# Patient Record
Sex: Male | Born: 1940 | Race: White | Hispanic: No | Marital: Married | Smoking: Never smoker
Health system: Southern US, Community
[De-identification: ages and names within clinical notes are randomized; demographics above are authoritative.]

## PROBLEM LIST (undated history)

## (undated) DIAGNOSIS — I10 Essential (primary) hypertension: Secondary | ICD-10-CM

## (undated) DIAGNOSIS — E119 Type 2 diabetes mellitus without complications: Secondary | ICD-10-CM

## (undated) HISTORY — PX: CATARACT EXTRACTION: SUR2

## (undated) HISTORY — PX: OTHER SURGICAL HISTORY: SHX169

---

## 1997-06-14 ENCOUNTER — Emergency Department (HOSPITAL_COMMUNITY): Admission: RE | Admit: 1997-06-14 | Discharge: 1997-06-14 | Payer: Self-pay | Admitting: Emergency Medicine

## 2000-10-15 ENCOUNTER — Emergency Department (HOSPITAL_COMMUNITY): Admission: EM | Admit: 2000-10-15 | Discharge: 2000-10-15 | Payer: Self-pay | Admitting: Emergency Medicine

## 2000-10-15 ENCOUNTER — Encounter: Payer: Self-pay | Admitting: Emergency Medicine

## 2002-03-08 ENCOUNTER — Ambulatory Visit (HOSPITAL_COMMUNITY): Admission: RE | Admit: 2002-03-08 | Discharge: 2002-03-08 | Payer: Self-pay | Admitting: *Deleted

## 2006-03-15 ENCOUNTER — Emergency Department (HOSPITAL_COMMUNITY): Admission: EM | Admit: 2006-03-15 | Discharge: 2006-03-15 | Payer: Self-pay | Admitting: Emergency Medicine

## 2006-03-30 ENCOUNTER — Ambulatory Visit (HOSPITAL_COMMUNITY): Admission: RE | Admit: 2006-03-30 | Discharge: 2006-03-30 | Payer: Self-pay | Admitting: Urology

## 2006-04-06 ENCOUNTER — Ambulatory Visit (HOSPITAL_COMMUNITY): Admission: AD | Admit: 2006-04-06 | Discharge: 2006-04-06 | Payer: Self-pay | Admitting: Urology

## 2006-08-10 ENCOUNTER — Encounter: Admission: RE | Admit: 2006-08-10 | Discharge: 2006-08-10 | Payer: Self-pay | Admitting: Internal Medicine

## 2010-06-14 NOTE — Op Note (Signed)
NAME:  Jose Higgins, Jose Higgins                 ACCOUNT NO.:  [id number]   MEDICAL RECORD NO.:  [id number]          PATIENT TYPE:  AMB   LOCATION:  DAY                          FACILITY:  [facility]   PHYSICIAN:  Excell Seltzer. Annabell Howells, M.D.    DATE OF BIRTH:  May 21, 1940   DATE OF PROCEDURE:  DATE OF DISCHARGE:                               OPERATIVE REPORT   Jose Higgins is a 70 year old white male with a right ureteral stone who  underwent lithotripsy a couple of weeks ago.  The stone did not  fragment.  He has continued to have pain and wants to go ahead with  ureteroscopy before he goes out of town.   PROCEDURE:  Patient is given Cipro.  He is taken to the operating room,  where a general anesthetic was induced.  He was placed in the lithotomy  position.  His perineum and genitalia were prepped with Betadine  solution.  He was draped in the usual sterile fashion.  Cystoscopy was  performed using a 22 [place] scope and the 12 and 70 degrees lenses.  Examination revealed a normal urethra.  The external sphincter was  intact.  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  Examination of the bladder revealed mild  trabeculation with no tumors, stones, or inflammation.  The ureteral  orifices were unremarkable.   An attempt was made to pass a Teflon-coated guidewire up the right  ureteral orifice.  It met the stone approximately 3 cm proximal to the  orifice and would not bypass the stone.  An attempt was then made to  pass the wire through a ureteroscope to stiffen it, but this was also  unsuccessful, so a sensor guidewire was then passed by the stone without  difficulty to the kidney.  The ureteroscope was removed, and a balloon  dilation catheter was placed over the wire.  The 4 cm 15 French balloon  was used.  It was inflated with saline to 16 atmospheres and then  removed.  The ureteroscope  was then reinserted alongside the wire.  The  stone was visualized, but it was  too large to remove intact.  A smaller  stone was noted proximal to the large stone, and that portion was  removed with a Nitinol basket.  The stone was then engaged with the  0.365 holmium laser fiber at 0.5 watts and 5 Hz, then fragmented readily  into manageable pieces, which were then removed with a nitinol basket.  After the stones were removed to the bladder, the cystoscope was then  reinserted over the guidewire.  A 6 French 26 cm double-J stent was  placed over the guidewire under fluoroscopic guidance to the kidney.  The wire was removed, leaving a good coil in the kidney and a good coil  in the bladder.  The stones were then evacuated from the bladder.  The  cystoscope was removed, and the stent string was left exiting the  urethra.  The bladder had been drained prior to removal of the scope.  The stent string was secured to the  patient's penis.  The patient was  taken down from the lithotomy position.  His anesthetic was reversed.  He was moved to the recovery room in stable condition.  There were no  complications.      Excell Seltzer. Annabell Howells, M.D.  Electronically Signed     JJW/MEDQ  D:  04/06/2006  T:  04/06/2006  Job:  [id number]   cc:   Soyla Murphy. Renne Crigler, M.D.  Fax: [phone]

## 2011-02-17 DIAGNOSIS — E7800 Pure hypercholesterolemia, unspecified: Secondary | ICD-10-CM | POA: Diagnosis not present

## 2011-02-17 DIAGNOSIS — E119 Type 2 diabetes mellitus without complications: Secondary | ICD-10-CM | POA: Diagnosis not present

## 2011-02-18 DIAGNOSIS — E119 Type 2 diabetes mellitus without complications: Secondary | ICD-10-CM | POA: Diagnosis not present

## 2011-02-18 DIAGNOSIS — I10 Essential (primary) hypertension: Secondary | ICD-10-CM | POA: Diagnosis not present

## 2011-02-18 DIAGNOSIS — E7800 Pure hypercholesterolemia, unspecified: Secondary | ICD-10-CM | POA: Diagnosis not present

## 2011-02-24 DIAGNOSIS — I10 Essential (primary) hypertension: Secondary | ICD-10-CM | POA: Diagnosis not present

## 2011-05-19 DIAGNOSIS — E7800 Pure hypercholesterolemia, unspecified: Secondary | ICD-10-CM | POA: Diagnosis not present

## 2011-05-19 DIAGNOSIS — E119 Type 2 diabetes mellitus without complications: Secondary | ICD-10-CM | POA: Diagnosis not present

## 2011-05-20 DIAGNOSIS — E119 Type 2 diabetes mellitus without complications: Secondary | ICD-10-CM | POA: Diagnosis not present

## 2011-05-20 DIAGNOSIS — E7800 Pure hypercholesterolemia, unspecified: Secondary | ICD-10-CM | POA: Diagnosis not present

## 2011-05-20 DIAGNOSIS — I10 Essential (primary) hypertension: Secondary | ICD-10-CM | POA: Diagnosis not present

## 2011-05-20 DIAGNOSIS — R7402 Elevation of levels of lactic acid dehydrogenase (LDH): Secondary | ICD-10-CM | POA: Diagnosis not present

## 2011-05-26 DIAGNOSIS — R7402 Elevation of levels of lactic acid dehydrogenase (LDH): Secondary | ICD-10-CM | POA: Diagnosis not present

## 2011-08-18 DIAGNOSIS — E119 Type 2 diabetes mellitus without complications: Secondary | ICD-10-CM | POA: Diagnosis not present

## 2011-08-18 DIAGNOSIS — E7800 Pure hypercholesterolemia, unspecified: Secondary | ICD-10-CM | POA: Diagnosis not present

## 2011-08-19 DIAGNOSIS — E875 Hyperkalemia: Secondary | ICD-10-CM | POA: Diagnosis not present

## 2011-08-19 DIAGNOSIS — I10 Essential (primary) hypertension: Secondary | ICD-10-CM | POA: Diagnosis not present

## 2011-08-19 DIAGNOSIS — N289 Disorder of kidney and ureter, unspecified: Secondary | ICD-10-CM | POA: Diagnosis not present

## 2011-08-19 DIAGNOSIS — E7800 Pure hypercholesterolemia, unspecified: Secondary | ICD-10-CM | POA: Diagnosis not present

## 2011-09-01 DIAGNOSIS — E1129 Type 2 diabetes mellitus with other diabetic kidney complication: Secondary | ICD-10-CM | POA: Diagnosis not present

## 2011-10-06 DIAGNOSIS — I10 Essential (primary) hypertension: Secondary | ICD-10-CM | POA: Diagnosis not present

## 2011-10-06 DIAGNOSIS — Z125 Encounter for screening for malignant neoplasm of prostate: Secondary | ICD-10-CM | POA: Diagnosis not present

## 2011-10-06 DIAGNOSIS — E1129 Type 2 diabetes mellitus with other diabetic kidney complication: Secondary | ICD-10-CM | POA: Diagnosis not present

## 2011-10-06 DIAGNOSIS — Z0000 Encounter for general adult medical examination without abnormal findings: Secondary | ICD-10-CM | POA: Diagnosis not present

## 2011-10-06 DIAGNOSIS — E7800 Pure hypercholesterolemia, unspecified: Secondary | ICD-10-CM | POA: Diagnosis not present

## 2011-10-09 DIAGNOSIS — I10 Essential (primary) hypertension: Secondary | ICD-10-CM | POA: Diagnosis not present

## 2011-10-09 DIAGNOSIS — Z0000 Encounter for general adult medical examination without abnormal findings: Secondary | ICD-10-CM | POA: Diagnosis not present

## 2011-10-09 DIAGNOSIS — Z23 Encounter for immunization: Secondary | ICD-10-CM | POA: Diagnosis not present

## 2011-10-09 DIAGNOSIS — R7402 Elevation of levels of lactic acid dehydrogenase (LDH): Secondary | ICD-10-CM | POA: Diagnosis not present

## 2011-10-09 DIAGNOSIS — E7800 Pure hypercholesterolemia, unspecified: Secondary | ICD-10-CM | POA: Diagnosis not present

## 2011-10-28 DIAGNOSIS — R972 Elevated prostate specific antigen [PSA]: Secondary | ICD-10-CM | POA: Diagnosis not present

## 2011-10-28 DIAGNOSIS — N138 Other obstructive and reflux uropathy: Secondary | ICD-10-CM | POA: Diagnosis not present

## 2011-10-28 DIAGNOSIS — N401 Enlarged prostate with lower urinary tract symptoms: Secondary | ICD-10-CM | POA: Diagnosis not present

## 2011-12-08 DIAGNOSIS — R972 Elevated prostate specific antigen [PSA]: Secondary | ICD-10-CM | POA: Diagnosis not present

## 2011-12-09 DIAGNOSIS — H6120 Impacted cerumen, unspecified ear: Secondary | ICD-10-CM | POA: Diagnosis not present

## 2011-12-18 DIAGNOSIS — E119 Type 2 diabetes mellitus without complications: Secondary | ICD-10-CM | POA: Diagnosis not present

## 2011-12-18 DIAGNOSIS — H2510 Age-related nuclear cataract, unspecified eye: Secondary | ICD-10-CM | POA: Diagnosis not present

## 2012-01-08 DIAGNOSIS — R972 Elevated prostate specific antigen [PSA]: Secondary | ICD-10-CM | POA: Diagnosis not present

## 2012-01-12 DIAGNOSIS — E7800 Pure hypercholesterolemia, unspecified: Secondary | ICD-10-CM | POA: Diagnosis not present

## 2012-01-12 DIAGNOSIS — E1129 Type 2 diabetes mellitus with other diabetic kidney complication: Secondary | ICD-10-CM | POA: Diagnosis not present

## 2012-01-13 DIAGNOSIS — E7800 Pure hypercholesterolemia, unspecified: Secondary | ICD-10-CM | POA: Diagnosis not present

## 2012-01-13 DIAGNOSIS — I10 Essential (primary) hypertension: Secondary | ICD-10-CM | POA: Diagnosis not present

## 2012-01-13 DIAGNOSIS — E1129 Type 2 diabetes mellitus with other diabetic kidney complication: Secondary | ICD-10-CM | POA: Diagnosis not present

## 2012-03-25 DIAGNOSIS — I781 Nevus, non-neoplastic: Secondary | ICD-10-CM | POA: Diagnosis not present

## 2012-03-25 DIAGNOSIS — L821 Other seborrheic keratosis: Secondary | ICD-10-CM | POA: Diagnosis not present

## 2012-03-25 DIAGNOSIS — Z85828 Personal history of other malignant neoplasm of skin: Secondary | ICD-10-CM | POA: Diagnosis not present

## 2012-03-25 DIAGNOSIS — D485 Neoplasm of uncertain behavior of skin: Secondary | ICD-10-CM | POA: Diagnosis not present

## 2012-03-25 DIAGNOSIS — D239 Other benign neoplasm of skin, unspecified: Secondary | ICD-10-CM | POA: Diagnosis not present

## 2012-04-13 DIAGNOSIS — R972 Elevated prostate specific antigen [PSA]: Secondary | ICD-10-CM | POA: Diagnosis not present

## 2012-04-19 DIAGNOSIS — N138 Other obstructive and reflux uropathy: Secondary | ICD-10-CM | POA: Diagnosis not present

## 2012-04-19 DIAGNOSIS — R972 Elevated prostate specific antigen [PSA]: Secondary | ICD-10-CM | POA: Diagnosis not present

## 2012-04-19 DIAGNOSIS — N401 Enlarged prostate with lower urinary tract symptoms: Secondary | ICD-10-CM | POA: Diagnosis not present

## 2012-04-19 DIAGNOSIS — N200 Calculus of kidney: Secondary | ICD-10-CM | POA: Diagnosis not present

## 2012-07-13 DIAGNOSIS — I10 Essential (primary) hypertension: Secondary | ICD-10-CM | POA: Diagnosis not present

## 2012-07-13 DIAGNOSIS — E7800 Pure hypercholesterolemia, unspecified: Secondary | ICD-10-CM | POA: Diagnosis not present

## 2012-07-13 DIAGNOSIS — E1129 Type 2 diabetes mellitus with other diabetic kidney complication: Secondary | ICD-10-CM | POA: Diagnosis not present

## 2012-10-07 DIAGNOSIS — Z23 Encounter for immunization: Secondary | ICD-10-CM | POA: Diagnosis not present

## 2012-10-07 DIAGNOSIS — E1129 Type 2 diabetes mellitus with other diabetic kidney complication: Secondary | ICD-10-CM | POA: Diagnosis not present

## 2012-10-07 DIAGNOSIS — Z0000 Encounter for general adult medical examination without abnormal findings: Secondary | ICD-10-CM | POA: Diagnosis not present

## 2012-10-07 DIAGNOSIS — N289 Disorder of kidney and ureter, unspecified: Secondary | ICD-10-CM | POA: Diagnosis not present

## 2012-10-07 DIAGNOSIS — I10 Essential (primary) hypertension: Secondary | ICD-10-CM | POA: Diagnosis not present

## 2012-10-07 DIAGNOSIS — Z125 Encounter for screening for malignant neoplasm of prostate: Secondary | ICD-10-CM | POA: Diagnosis not present

## 2012-10-07 DIAGNOSIS — E7800 Pure hypercholesterolemia, unspecified: Secondary | ICD-10-CM | POA: Diagnosis not present

## 2012-10-08 DIAGNOSIS — R197 Diarrhea, unspecified: Secondary | ICD-10-CM | POA: Diagnosis not present

## 2012-10-08 DIAGNOSIS — E7800 Pure hypercholesterolemia, unspecified: Secondary | ICD-10-CM | POA: Diagnosis not present

## 2012-10-08 DIAGNOSIS — I10 Essential (primary) hypertension: Secondary | ICD-10-CM | POA: Diagnosis not present

## 2012-10-08 DIAGNOSIS — E119 Type 2 diabetes mellitus without complications: Secondary | ICD-10-CM | POA: Diagnosis not present

## 2012-11-09 DIAGNOSIS — I10 Essential (primary) hypertension: Secondary | ICD-10-CM | POA: Diagnosis not present

## 2012-11-09 DIAGNOSIS — E7800 Pure hypercholesterolemia, unspecified: Secondary | ICD-10-CM | POA: Diagnosis not present

## 2012-11-09 DIAGNOSIS — E1129 Type 2 diabetes mellitus with other diabetic kidney complication: Secondary | ICD-10-CM | POA: Diagnosis not present

## 2012-11-10 DIAGNOSIS — E7800 Pure hypercholesterolemia, unspecified: Secondary | ICD-10-CM | POA: Diagnosis not present

## 2012-11-10 DIAGNOSIS — Z006 Encounter for examination for normal comparison and control in clinical research program: Secondary | ICD-10-CM | POA: Diagnosis not present

## 2012-11-10 DIAGNOSIS — N289 Disorder of kidney and ureter, unspecified: Secondary | ICD-10-CM | POA: Diagnosis not present

## 2012-11-10 DIAGNOSIS — I10 Essential (primary) hypertension: Secondary | ICD-10-CM | POA: Diagnosis not present

## 2012-11-16 DIAGNOSIS — M5430 Sciatica, unspecified side: Secondary | ICD-10-CM | POA: Diagnosis not present

## 2012-11-16 DIAGNOSIS — E1129 Type 2 diabetes mellitus with other diabetic kidney complication: Secondary | ICD-10-CM | POA: Diagnosis not present

## 2012-11-16 DIAGNOSIS — M25559 Pain in unspecified hip: Secondary | ICD-10-CM | POA: Diagnosis not present

## 2013-01-04 DIAGNOSIS — K644 Residual hemorrhoidal skin tags: Secondary | ICD-10-CM | POA: Diagnosis not present

## 2013-01-04 DIAGNOSIS — Z1211 Encounter for screening for malignant neoplasm of colon: Secondary | ICD-10-CM | POA: Diagnosis not present

## 2013-01-04 DIAGNOSIS — K5730 Diverticulosis of large intestine without perforation or abscess without bleeding: Secondary | ICD-10-CM | POA: Diagnosis not present

## 2013-01-04 DIAGNOSIS — D126 Benign neoplasm of colon, unspecified: Secondary | ICD-10-CM | POA: Diagnosis not present

## 2013-01-04 DIAGNOSIS — K620 Anal polyp: Secondary | ICD-10-CM | POA: Diagnosis not present

## 2013-01-13 DIAGNOSIS — E119 Type 2 diabetes mellitus without complications: Secondary | ICD-10-CM | POA: Diagnosis not present

## 2013-01-13 DIAGNOSIS — H2510 Age-related nuclear cataract, unspecified eye: Secondary | ICD-10-CM | POA: Diagnosis not present

## 2013-03-21 DIAGNOSIS — E7800 Pure hypercholesterolemia, unspecified: Secondary | ICD-10-CM | POA: Diagnosis not present

## 2013-03-21 DIAGNOSIS — E1129 Type 2 diabetes mellitus with other diabetic kidney complication: Secondary | ICD-10-CM | POA: Diagnosis not present

## 2013-03-22 DIAGNOSIS — I10 Essential (primary) hypertension: Secondary | ICD-10-CM | POA: Diagnosis not present

## 2013-03-22 DIAGNOSIS — E7800 Pure hypercholesterolemia, unspecified: Secondary | ICD-10-CM | POA: Diagnosis not present

## 2013-03-22 DIAGNOSIS — E1129 Type 2 diabetes mellitus with other diabetic kidney complication: Secondary | ICD-10-CM | POA: Diagnosis not present

## 2013-06-13 DIAGNOSIS — E1129 Type 2 diabetes mellitus with other diabetic kidney complication: Secondary | ICD-10-CM | POA: Diagnosis not present

## 2013-06-13 DIAGNOSIS — E7800 Pure hypercholesterolemia, unspecified: Secondary | ICD-10-CM | POA: Diagnosis not present

## 2013-06-14 DIAGNOSIS — E7800 Pure hypercholesterolemia, unspecified: Secondary | ICD-10-CM | POA: Diagnosis not present

## 2013-06-14 DIAGNOSIS — E1129 Type 2 diabetes mellitus with other diabetic kidney complication: Secondary | ICD-10-CM | POA: Diagnosis not present

## 2013-06-14 DIAGNOSIS — I10 Essential (primary) hypertension: Secondary | ICD-10-CM | POA: Diagnosis not present

## 2013-07-14 DIAGNOSIS — D239 Other benign neoplasm of skin, unspecified: Secondary | ICD-10-CM | POA: Diagnosis not present

## 2013-07-14 DIAGNOSIS — C44519 Basal cell carcinoma of skin of other part of trunk: Secondary | ICD-10-CM | POA: Diagnosis not present

## 2013-07-14 DIAGNOSIS — C44711 Basal cell carcinoma of skin of unspecified lower limb, including hip: Secondary | ICD-10-CM | POA: Diagnosis not present

## 2013-07-14 DIAGNOSIS — Z85828 Personal history of other malignant neoplasm of skin: Secondary | ICD-10-CM | POA: Diagnosis not present

## 2013-07-14 DIAGNOSIS — L821 Other seborrheic keratosis: Secondary | ICD-10-CM | POA: Diagnosis not present

## 2013-07-14 DIAGNOSIS — D485 Neoplasm of uncertain behavior of skin: Secondary | ICD-10-CM | POA: Diagnosis not present

## 2013-08-15 DIAGNOSIS — E1129 Type 2 diabetes mellitus with other diabetic kidney complication: Secondary | ICD-10-CM | POA: Diagnosis not present

## 2013-08-15 DIAGNOSIS — E7800 Pure hypercholesterolemia, unspecified: Secondary | ICD-10-CM | POA: Diagnosis not present

## 2013-08-15 DIAGNOSIS — C44519 Basal cell carcinoma of skin of other part of trunk: Secondary | ICD-10-CM | POA: Diagnosis not present

## 2013-08-15 DIAGNOSIS — C44711 Basal cell carcinoma of skin of unspecified lower limb, including hip: Secondary | ICD-10-CM | POA: Diagnosis not present

## 2013-08-16 DIAGNOSIS — E7800 Pure hypercholesterolemia, unspecified: Secondary | ICD-10-CM | POA: Diagnosis not present

## 2013-08-16 DIAGNOSIS — I10 Essential (primary) hypertension: Secondary | ICD-10-CM | POA: Diagnosis not present

## 2013-08-16 DIAGNOSIS — E1129 Type 2 diabetes mellitus with other diabetic kidney complication: Secondary | ICD-10-CM | POA: Diagnosis not present

## 2013-09-23 DIAGNOSIS — R972 Elevated prostate specific antigen [PSA]: Secondary | ICD-10-CM | POA: Diagnosis not present

## 2013-09-29 DIAGNOSIS — R972 Elevated prostate specific antigen [PSA]: Secondary | ICD-10-CM | POA: Diagnosis not present

## 2013-09-29 DIAGNOSIS — N139 Obstructive and reflux uropathy, unspecified: Secondary | ICD-10-CM | POA: Diagnosis not present

## 2013-09-29 DIAGNOSIS — N200 Calculus of kidney: Secondary | ICD-10-CM | POA: Diagnosis not present

## 2013-09-29 DIAGNOSIS — N401 Enlarged prostate with lower urinary tract symptoms: Secondary | ICD-10-CM | POA: Diagnosis not present

## 2013-09-29 DIAGNOSIS — N138 Other obstructive and reflux uropathy: Secondary | ICD-10-CM | POA: Diagnosis not present

## 2013-10-18 DIAGNOSIS — Z0000 Encounter for general adult medical examination without abnormal findings: Secondary | ICD-10-CM | POA: Diagnosis not present

## 2013-10-18 DIAGNOSIS — Z23 Encounter for immunization: Secondary | ICD-10-CM | POA: Diagnosis not present

## 2013-10-18 DIAGNOSIS — Z125 Encounter for screening for malignant neoplasm of prostate: Secondary | ICD-10-CM | POA: Diagnosis not present

## 2013-10-18 DIAGNOSIS — I10 Essential (primary) hypertension: Secondary | ICD-10-CM | POA: Diagnosis not present

## 2013-10-18 DIAGNOSIS — E1129 Type 2 diabetes mellitus with other diabetic kidney complication: Secondary | ICD-10-CM | POA: Diagnosis not present

## 2013-10-18 DIAGNOSIS — N289 Disorder of kidney and ureter, unspecified: Secondary | ICD-10-CM | POA: Diagnosis not present

## 2013-10-25 DIAGNOSIS — E1129 Type 2 diabetes mellitus with other diabetic kidney complication: Secondary | ICD-10-CM | POA: Diagnosis not present

## 2013-10-25 DIAGNOSIS — I10 Essential (primary) hypertension: Secondary | ICD-10-CM | POA: Diagnosis not present

## 2013-10-25 DIAGNOSIS — E7800 Pure hypercholesterolemia, unspecified: Secondary | ICD-10-CM | POA: Diagnosis not present

## 2013-10-25 DIAGNOSIS — N289 Disorder of kidney and ureter, unspecified: Secondary | ICD-10-CM | POA: Diagnosis not present

## 2013-11-11 DIAGNOSIS — Z23 Encounter for immunization: Secondary | ICD-10-CM | POA: Diagnosis not present

## 2013-11-14 DIAGNOSIS — E1121 Type 2 diabetes mellitus with diabetic nephropathy: Secondary | ICD-10-CM | POA: Diagnosis not present

## 2013-11-15 DIAGNOSIS — E785 Hyperlipidemia, unspecified: Secondary | ICD-10-CM | POA: Diagnosis not present

## 2013-11-15 DIAGNOSIS — E119 Type 2 diabetes mellitus without complications: Secondary | ICD-10-CM | POA: Diagnosis not present

## 2013-11-15 DIAGNOSIS — I10 Essential (primary) hypertension: Secondary | ICD-10-CM | POA: Diagnosis not present

## 2014-02-13 DIAGNOSIS — E785 Hyperlipidemia, unspecified: Secondary | ICD-10-CM | POA: Diagnosis not present

## 2014-02-13 DIAGNOSIS — E118 Type 2 diabetes mellitus with unspecified complications: Secondary | ICD-10-CM | POA: Diagnosis not present

## 2014-02-13 DIAGNOSIS — I10 Essential (primary) hypertension: Secondary | ICD-10-CM | POA: Diagnosis not present

## 2014-02-14 DIAGNOSIS — I10 Essential (primary) hypertension: Secondary | ICD-10-CM | POA: Diagnosis not present

## 2014-02-14 DIAGNOSIS — E119 Type 2 diabetes mellitus without complications: Secondary | ICD-10-CM | POA: Diagnosis not present

## 2014-02-14 DIAGNOSIS — E785 Hyperlipidemia, unspecified: Secondary | ICD-10-CM | POA: Diagnosis not present

## 2014-04-06 DIAGNOSIS — E119 Type 2 diabetes mellitus without complications: Secondary | ICD-10-CM | POA: Diagnosis not present

## 2014-04-06 DIAGNOSIS — H2513 Age-related nuclear cataract, bilateral: Secondary | ICD-10-CM | POA: Diagnosis not present

## 2014-05-15 DIAGNOSIS — E119 Type 2 diabetes mellitus without complications: Secondary | ICD-10-CM | POA: Diagnosis not present

## 2014-05-15 DIAGNOSIS — I10 Essential (primary) hypertension: Secondary | ICD-10-CM | POA: Diagnosis not present

## 2014-05-15 DIAGNOSIS — E785 Hyperlipidemia, unspecified: Secondary | ICD-10-CM | POA: Diagnosis not present

## 2014-05-16 DIAGNOSIS — E785 Hyperlipidemia, unspecified: Secondary | ICD-10-CM | POA: Diagnosis not present

## 2014-05-16 DIAGNOSIS — I10 Essential (primary) hypertension: Secondary | ICD-10-CM | POA: Diagnosis not present

## 2014-05-16 DIAGNOSIS — E119 Type 2 diabetes mellitus without complications: Secondary | ICD-10-CM | POA: Diagnosis not present

## 2014-06-09 DIAGNOSIS — D485 Neoplasm of uncertain behavior of skin: Secondary | ICD-10-CM | POA: Diagnosis not present

## 2014-06-09 DIAGNOSIS — C44212 Basal cell carcinoma of skin of right ear and external auricular canal: Secondary | ICD-10-CM | POA: Diagnosis not present

## 2014-06-09 DIAGNOSIS — L821 Other seborrheic keratosis: Secondary | ICD-10-CM | POA: Diagnosis not present

## 2014-07-18 DIAGNOSIS — C44212 Basal cell carcinoma of skin of right ear and external auricular canal: Secondary | ICD-10-CM | POA: Diagnosis not present

## 2014-08-14 DIAGNOSIS — E119 Type 2 diabetes mellitus without complications: Secondary | ICD-10-CM | POA: Diagnosis not present

## 2014-08-14 DIAGNOSIS — E785 Hyperlipidemia, unspecified: Secondary | ICD-10-CM | POA: Diagnosis not present

## 2014-08-14 DIAGNOSIS — I10 Essential (primary) hypertension: Secondary | ICD-10-CM | POA: Diagnosis not present

## 2014-08-15 DIAGNOSIS — I10 Essential (primary) hypertension: Secondary | ICD-10-CM | POA: Diagnosis not present

## 2014-08-15 DIAGNOSIS — E119 Type 2 diabetes mellitus without complications: Secondary | ICD-10-CM | POA: Diagnosis not present

## 2014-08-15 DIAGNOSIS — E785 Hyperlipidemia, unspecified: Secondary | ICD-10-CM | POA: Diagnosis not present

## 2014-08-28 DIAGNOSIS — L821 Other seborrheic keratosis: Secondary | ICD-10-CM | POA: Diagnosis not present

## 2014-08-28 DIAGNOSIS — D225 Melanocytic nevi of trunk: Secondary | ICD-10-CM | POA: Diagnosis not present

## 2014-08-28 DIAGNOSIS — Z85828 Personal history of other malignant neoplasm of skin: Secondary | ICD-10-CM | POA: Diagnosis not present

## 2014-10-03 DIAGNOSIS — J40 Bronchitis, not specified as acute or chronic: Secondary | ICD-10-CM | POA: Diagnosis not present

## 2014-10-23 DIAGNOSIS — N138 Other obstructive and reflux uropathy: Secondary | ICD-10-CM | POA: Diagnosis not present

## 2014-10-23 DIAGNOSIS — N401 Enlarged prostate with lower urinary tract symptoms: Secondary | ICD-10-CM | POA: Diagnosis not present

## 2014-10-23 DIAGNOSIS — N200 Calculus of kidney: Secondary | ICD-10-CM | POA: Diagnosis not present

## 2014-10-23 DIAGNOSIS — R3911 Hesitancy of micturition: Secondary | ICD-10-CM | POA: Diagnosis not present

## 2014-11-13 DIAGNOSIS — I10 Essential (primary) hypertension: Secondary | ICD-10-CM | POA: Diagnosis not present

## 2014-11-13 DIAGNOSIS — E119 Type 2 diabetes mellitus without complications: Secondary | ICD-10-CM | POA: Diagnosis not present

## 2014-11-13 DIAGNOSIS — Z0000 Encounter for general adult medical examination without abnormal findings: Secondary | ICD-10-CM | POA: Diagnosis not present

## 2014-11-13 DIAGNOSIS — Z23 Encounter for immunization: Secondary | ICD-10-CM | POA: Diagnosis not present

## 2014-11-13 DIAGNOSIS — Z125 Encounter for screening for malignant neoplasm of prostate: Secondary | ICD-10-CM | POA: Diagnosis not present

## 2014-11-13 DIAGNOSIS — E785 Hyperlipidemia, unspecified: Secondary | ICD-10-CM | POA: Diagnosis not present

## 2014-11-13 DIAGNOSIS — R972 Elevated prostate specific antigen [PSA]: Secondary | ICD-10-CM | POA: Diagnosis not present

## 2014-11-14 DIAGNOSIS — E785 Hyperlipidemia, unspecified: Secondary | ICD-10-CM | POA: Diagnosis not present

## 2014-11-14 DIAGNOSIS — I10 Essential (primary) hypertension: Secondary | ICD-10-CM | POA: Diagnosis not present

## 2014-11-14 DIAGNOSIS — E119 Type 2 diabetes mellitus without complications: Secondary | ICD-10-CM | POA: Diagnosis not present

## 2014-11-20 DIAGNOSIS — E1129 Type 2 diabetes mellitus with other diabetic kidney complication: Secondary | ICD-10-CM | POA: Diagnosis not present

## 2014-11-20 DIAGNOSIS — I10 Essential (primary) hypertension: Secondary | ICD-10-CM | POA: Diagnosis not present

## 2014-11-20 DIAGNOSIS — E7800 Pure hypercholesterolemia, unspecified: Secondary | ICD-10-CM | POA: Diagnosis not present

## 2014-11-20 DIAGNOSIS — Z6828 Body mass index (BMI) 28.0-28.9, adult: Secondary | ICD-10-CM | POA: Diagnosis not present

## 2015-01-10 DIAGNOSIS — E119 Type 2 diabetes mellitus without complications: Secondary | ICD-10-CM | POA: Diagnosis not present

## 2015-01-10 DIAGNOSIS — I10 Essential (primary) hypertension: Secondary | ICD-10-CM | POA: Diagnosis not present

## 2015-03-12 DIAGNOSIS — E119 Type 2 diabetes mellitus without complications: Secondary | ICD-10-CM | POA: Diagnosis not present

## 2015-03-12 DIAGNOSIS — E1129 Type 2 diabetes mellitus with other diabetic kidney complication: Secondary | ICD-10-CM | POA: Diagnosis not present

## 2015-04-02 DIAGNOSIS — H2513 Age-related nuclear cataract, bilateral: Secondary | ICD-10-CM | POA: Diagnosis not present

## 2015-04-02 DIAGNOSIS — E119 Type 2 diabetes mellitus without complications: Secondary | ICD-10-CM | POA: Diagnosis not present

## 2015-04-02 DIAGNOSIS — H524 Presbyopia: Secondary | ICD-10-CM | POA: Diagnosis not present

## 2015-05-14 DIAGNOSIS — E1129 Type 2 diabetes mellitus with other diabetic kidney complication: Secondary | ICD-10-CM | POA: Diagnosis not present

## 2015-05-14 DIAGNOSIS — E119 Type 2 diabetes mellitus without complications: Secondary | ICD-10-CM | POA: Diagnosis not present

## 2015-05-14 DIAGNOSIS — E785 Hyperlipidemia, unspecified: Secondary | ICD-10-CM | POA: Diagnosis not present

## 2015-05-14 DIAGNOSIS — I10 Essential (primary) hypertension: Secondary | ICD-10-CM | POA: Diagnosis not present

## 2015-05-14 DIAGNOSIS — E1121 Type 2 diabetes mellitus with diabetic nephropathy: Secondary | ICD-10-CM | POA: Diagnosis not present

## 2015-05-15 DIAGNOSIS — E785 Hyperlipidemia, unspecified: Secondary | ICD-10-CM | POA: Diagnosis not present

## 2015-05-15 DIAGNOSIS — E119 Type 2 diabetes mellitus without complications: Secondary | ICD-10-CM | POA: Diagnosis not present

## 2015-05-15 DIAGNOSIS — I10 Essential (primary) hypertension: Secondary | ICD-10-CM | POA: Diagnosis not present

## 2015-06-13 DIAGNOSIS — R972 Elevated prostate specific antigen [PSA]: Secondary | ICD-10-CM | POA: Diagnosis not present

## 2015-08-20 DIAGNOSIS — E7800 Pure hypercholesterolemia, unspecified: Secondary | ICD-10-CM | POA: Diagnosis not present

## 2015-08-21 DIAGNOSIS — E119 Type 2 diabetes mellitus without complications: Secondary | ICD-10-CM | POA: Diagnosis not present

## 2015-08-21 DIAGNOSIS — I10 Essential (primary) hypertension: Secondary | ICD-10-CM | POA: Diagnosis not present

## 2015-08-21 DIAGNOSIS — E7800 Pure hypercholesterolemia, unspecified: Secondary | ICD-10-CM | POA: Diagnosis not present

## 2015-09-10 DIAGNOSIS — D225 Melanocytic nevi of trunk: Secondary | ICD-10-CM | POA: Diagnosis not present

## 2015-09-10 DIAGNOSIS — Z85828 Personal history of other malignant neoplasm of skin: Secondary | ICD-10-CM | POA: Diagnosis not present

## 2015-09-10 DIAGNOSIS — D485 Neoplasm of uncertain behavior of skin: Secondary | ICD-10-CM | POA: Diagnosis not present

## 2015-09-10 DIAGNOSIS — L821 Other seborrheic keratosis: Secondary | ICD-10-CM | POA: Diagnosis not present

## 2015-09-10 DIAGNOSIS — L570 Actinic keratosis: Secondary | ICD-10-CM | POA: Diagnosis not present

## 2015-11-07 DIAGNOSIS — Z23 Encounter for immunization: Secondary | ICD-10-CM | POA: Diagnosis not present

## 2015-11-29 DIAGNOSIS — E7800 Pure hypercholesterolemia, unspecified: Secondary | ICD-10-CM | POA: Diagnosis not present

## 2015-11-29 DIAGNOSIS — Z125 Encounter for screening for malignant neoplasm of prostate: Secondary | ICD-10-CM | POA: Diagnosis not present

## 2015-11-29 DIAGNOSIS — E119 Type 2 diabetes mellitus without complications: Secondary | ICD-10-CM | POA: Diagnosis not present

## 2015-11-29 DIAGNOSIS — I10 Essential (primary) hypertension: Secondary | ICD-10-CM | POA: Diagnosis not present

## 2015-11-29 DIAGNOSIS — Z0000 Encounter for general adult medical examination without abnormal findings: Secondary | ICD-10-CM | POA: Diagnosis not present

## 2015-12-03 DIAGNOSIS — J309 Allergic rhinitis, unspecified: Secondary | ICD-10-CM | POA: Diagnosis not present

## 2015-12-03 DIAGNOSIS — Z23 Encounter for immunization: Secondary | ICD-10-CM | POA: Diagnosis not present

## 2015-12-03 DIAGNOSIS — I10 Essential (primary) hypertension: Secondary | ICD-10-CM | POA: Diagnosis not present

## 2015-12-03 DIAGNOSIS — E119 Type 2 diabetes mellitus without complications: Secondary | ICD-10-CM | POA: Diagnosis not present

## 2015-12-03 DIAGNOSIS — Z87442 Personal history of urinary calculi: Secondary | ICD-10-CM | POA: Diagnosis not present

## 2015-12-03 DIAGNOSIS — R3911 Hesitancy of micturition: Secondary | ICD-10-CM | POA: Diagnosis not present

## 2015-12-03 DIAGNOSIS — N5201 Erectile dysfunction due to arterial insufficiency: Secondary | ICD-10-CM | POA: Diagnosis not present

## 2015-12-03 DIAGNOSIS — R972 Elevated prostate specific antigen [PSA]: Secondary | ICD-10-CM | POA: Diagnosis not present

## 2015-12-03 DIAGNOSIS — N401 Enlarged prostate with lower urinary tract symptoms: Secondary | ICD-10-CM | POA: Diagnosis not present

## 2015-12-14 DIAGNOSIS — H6122 Impacted cerumen, left ear: Secondary | ICD-10-CM | POA: Diagnosis not present

## 2015-12-14 DIAGNOSIS — I10 Essential (primary) hypertension: Secondary | ICD-10-CM | POA: Diagnosis not present

## 2015-12-25 DIAGNOSIS — Z23 Encounter for immunization: Secondary | ICD-10-CM | POA: Diagnosis not present

## 2015-12-25 DIAGNOSIS — D485 Neoplasm of uncertain behavior of skin: Secondary | ICD-10-CM | POA: Diagnosis not present

## 2015-12-26 DIAGNOSIS — L820 Inflamed seborrheic keratosis: Secondary | ICD-10-CM | POA: Diagnosis not present

## 2016-02-18 DIAGNOSIS — E119 Type 2 diabetes mellitus without complications: Secondary | ICD-10-CM | POA: Diagnosis not present

## 2016-02-18 DIAGNOSIS — E7800 Pure hypercholesterolemia, unspecified: Secondary | ICD-10-CM | POA: Diagnosis not present

## 2016-02-18 DIAGNOSIS — I10 Essential (primary) hypertension: Secondary | ICD-10-CM | POA: Diagnosis not present

## 2016-02-19 DIAGNOSIS — E119 Type 2 diabetes mellitus without complications: Secondary | ICD-10-CM | POA: Diagnosis not present

## 2016-02-19 DIAGNOSIS — I10 Essential (primary) hypertension: Secondary | ICD-10-CM | POA: Diagnosis not present

## 2016-05-12 DIAGNOSIS — E119 Type 2 diabetes mellitus without complications: Secondary | ICD-10-CM | POA: Diagnosis not present

## 2016-05-12 DIAGNOSIS — H524 Presbyopia: Secondary | ICD-10-CM | POA: Diagnosis not present

## 2016-05-12 DIAGNOSIS — H2513 Age-related nuclear cataract, bilateral: Secondary | ICD-10-CM | POA: Diagnosis not present

## 2016-08-11 DIAGNOSIS — E119 Type 2 diabetes mellitus without complications: Secondary | ICD-10-CM | POA: Diagnosis not present

## 2016-08-12 DIAGNOSIS — I10 Essential (primary) hypertension: Secondary | ICD-10-CM | POA: Diagnosis not present

## 2016-08-12 DIAGNOSIS — E119 Type 2 diabetes mellitus without complications: Secondary | ICD-10-CM | POA: Diagnosis not present

## 2016-08-27 DIAGNOSIS — D170 Benign lipomatous neoplasm of skin and subcutaneous tissue of head, face and neck: Secondary | ICD-10-CM | POA: Diagnosis not present

## 2016-09-16 DIAGNOSIS — Z85828 Personal history of other malignant neoplasm of skin: Secondary | ICD-10-CM | POA: Diagnosis not present

## 2016-09-16 DIAGNOSIS — D170 Benign lipomatous neoplasm of skin and subcutaneous tissue of head, face and neck: Secondary | ICD-10-CM | POA: Diagnosis not present

## 2016-09-16 DIAGNOSIS — L821 Other seborrheic keratosis: Secondary | ICD-10-CM | POA: Diagnosis not present

## 2016-09-19 ENCOUNTER — Ambulatory Visit: Payer: Self-pay | Admitting: Surgery

## 2016-09-19 DIAGNOSIS — D170 Benign lipomatous neoplasm of skin and subcutaneous tissue of head, face and neck: Secondary | ICD-10-CM | POA: Diagnosis not present

## 2016-09-19 NOTE — H&P (Signed)
History of Present Illness Jose Higgins. Jose Lindor MD; 09/19/2016 1:10 PM) The patient is a 76 year old male who presents with a complaint of Mass. Referred by Dr. Deland Higgins for mass posterior neck  This is a 76 year old male in good health who presents with at least 20 years of a slowly enlarging mass on his posterior neck. Recently he woke from sleeping and had severe pain in this area. Frequently, he does have some moderate discomfort in this area. However this one episode was more severe. This has resolved but the mass persisted. The patient will like to have this area removed to prevent a recurrence of the severe attack pain.   Past Surgical History (Jose Higgins, Jose Higgins; 09/19/2016 10:05 AM) Colon Polyp Removal - Open Oral Surgery Tonsillectomy  Diagnostic Studies History (Jose Higgins, Jose Higgins; 09/19/2016 10:05 AM) Colonoscopy 1-5 years ago  Allergies (Jose Higgins, Jose Higgins; 09/19/2016 10:06 AM) No Known Drug Allergies 09/19/2016 Allergies Reconciled  Medication History (Jose Higgins, Jose Higgins; 09/19/2016 10:08 AM) Jose Higgins (50-1000MG  Tablet, Oral) Active. Losartan Potassium (50MG  Tablet, Oral) Active. Metoprolol Tartrate (50MG  Tablet, Oral) Active. Tamsulosin HCl (0.4MG  Capsule, Oral) Active. Jose Higgins (10MG  Tablet, Oral) Active. Atorvastatin Calcium (20MG  Tablet, Oral) Active. ZyrTEC Allergy (10MG  Tablet, Oral) Active. Medications Reconciled  Social History (Jose Higgins, Jose Higgins; 09/19/2016 10:05 AM) Alcohol use Occasional alcohol use. Caffeine use Coffee. No drug use Tobacco use Never smoker.  Family History (Jose Higgins, Jose Higgins; 09/19/2016 10:05 AM) Arthritis Father. Heart Disease Father. Heart disease in male family member before age 66 Respiratory Condition Mother.  Other Problems (Jose Higgins, [place]; 09/19/2016 10:05 AM) Diabetes Mellitus Enlarged Prostate Kidney Stone Melanoma     Review of Systems (Jose Higgins; 09/19/2016  10:05 AM) General Not Present- Appetite Loss, Chills, Fatigue, Fever, Night Sweats, Weight Gain and Weight Loss. Skin Not Present- Change in Wart/Mole, Dryness, Hives, Jaundice, New Lesions, Non-Healing Wounds, Rash and Ulcer. HEENT Present- Seasonal Allergies. Not Present- Earache, Hearing Loss, Hoarseness, Nose Bleed, Oral Ulcers, Ringing in the Ears, Sinus Pain, Sore Throat, Visual Disturbances, Wears glasses/contact lenses and Yellow Eyes. Respiratory Not Present- Bloody sputum, Chronic Cough, Difficulty Breathing, Snoring and Wheezing. Breast Not Present- Breast Mass, Breast Pain, Nipple Discharge and Skin Changes. Cardiovascular Present- Leg Cramps. Not Present- Chest Pain, Difficulty Breathing Lying Down, Palpitations, Rapid Heart Rate, Shortness of Breath and Swelling of Extremities. Gastrointestinal Not Present- Abdominal Pain, Bloating, Bloody Stool, Change in Bowel Habits, Chronic diarrhea, Constipation, Difficulty Swallowing, Excessive gas, Gets full quickly at meals, Hemorrhoids, Indigestion, Nausea, Rectal Pain and Vomiting. Male Genitourinary Present- Change in Urinary Stream. Not Present- Blood in Urine, Frequency, Impotence, Nocturia, Painful Urination, Urgency and Urine Leakage. Musculoskeletal Present- Joint Stiffness. Not Present- Back Pain, Joint Pain, Muscle Pain, Muscle Weakness and Swelling of Extremities. Neurological Not Present- Decreased Memory, Fainting, Headaches, Numbness, Seizures, Tingling, Tremor, Trouble walking and Weakness. Psychiatric Not Present- Anxiety, Bipolar, Change in Sleep Pattern, Depression, Fearful and Frequent crying. Endocrine Not Present- Cold Intolerance, Excessive Hunger, Hair Changes, Heat Intolerance, Hot flashes and New Diabetes. Hematology Not Present- Blood Thinners, Easy Bruising, Excessive bleeding, Gland problems, HIV and Persistent Infections.  Vitals (Jose Higgins; 09/19/2016 10:06 AM) 09/19/2016 10:05 AM Weight: 210 lb Height:  70in Body Surface Area: 2.13 m Body Mass Index: 30.13 kg/m  Temp.: 97.34F  Pulse: 84 (Regular)  P.OX: 94% (Room air) BP: 132/86 (Sitting, Left Arm, Standard)      Physical Exam Jose Key K. Kalle Bernath MD; 09/19/2016 1:11 PM)  The physical exam findings are as follows: Note:WDWN in NAD Eyes: Pupils equal, round; sclera anicteric HENT: Oral mucosa moist; good dentition Neck: Posterior neck - 3 cm protruding subcutaneous mass; smooth, firm, no sign of infection. no thyromegaly Lungs: CTA bilaterally; normal respiratory effort CV: Regular rate and rhythm; no murmurs; extremities well-perfused with no edema Abd: +bowel sounds, soft, non-tender, no palpable organomegaly; no palpable hernias Skin: Warm, dry; no sign of jaundice Psychiatric - alert and oriented x 4; calm mood and affect    Assessment & Plan Jose Key K. 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 The surgical procedure has been discussed with the patient. Potential risks, benefits, alternative treatments, and expected outcomes have been explained. All of the patient's questions at this time have been answered. The likelihood of reaching the patient's treatment goal is good. The patient understand the proposed surgical procedure and wishes to proceed.  Jose Higgins. Jose Dover, MD, [facility] Surgery  General/ Trauma Surgery  09/19/2016 1:11 PM

## 2016-10-28 DIAGNOSIS — D170 Benign lipomatous neoplasm of skin and subcutaneous tissue of head, face and neck: Secondary | ICD-10-CM | POA: Diagnosis not present

## 2016-11-06 DIAGNOSIS — Z23 Encounter for immunization: Secondary | ICD-10-CM | POA: Diagnosis not present

## 2016-12-08 DIAGNOSIS — E785 Hyperlipidemia, unspecified: Secondary | ICD-10-CM | POA: Diagnosis not present

## 2016-12-08 DIAGNOSIS — Z0000 Encounter for general adult medical examination without abnormal findings: Secondary | ICD-10-CM | POA: Diagnosis not present

## 2016-12-08 DIAGNOSIS — E119 Type 2 diabetes mellitus without complications: Secondary | ICD-10-CM | POA: Diagnosis not present

## 2016-12-08 DIAGNOSIS — Z7901 Long term (current) use of anticoagulants: Secondary | ICD-10-CM | POA: Diagnosis not present

## 2016-12-08 DIAGNOSIS — I10 Essential (primary) hypertension: Secondary | ICD-10-CM | POA: Diagnosis not present

## 2016-12-08 DIAGNOSIS — Z125 Encounter for screening for malignant neoplasm of prostate: Secondary | ICD-10-CM | POA: Diagnosis not present

## 2016-12-11 DIAGNOSIS — H269 Unspecified cataract: Secondary | ICD-10-CM | POA: Diagnosis not present

## 2016-12-11 DIAGNOSIS — I10 Essential (primary) hypertension: Secondary | ICD-10-CM | POA: Diagnosis not present

## 2016-12-11 DIAGNOSIS — Z7901 Long term (current) use of anticoagulants: Secondary | ICD-10-CM | POA: Diagnosis not present

## 2016-12-11 DIAGNOSIS — E785 Hyperlipidemia, unspecified: Secondary | ICD-10-CM | POA: Diagnosis not present

## 2016-12-11 DIAGNOSIS — M7050 Other bursitis of knee, unspecified knee: Secondary | ICD-10-CM | POA: Diagnosis not present

## 2016-12-11 DIAGNOSIS — N401 Enlarged prostate with lower urinary tract symptoms: Secondary | ICD-10-CM | POA: Diagnosis not present

## 2016-12-11 DIAGNOSIS — J309 Allergic rhinitis, unspecified: Secondary | ICD-10-CM | POA: Diagnosis not present

## 2016-12-11 DIAGNOSIS — R972 Elevated prostate specific antigen [PSA]: Secondary | ICD-10-CM | POA: Diagnosis not present

## 2016-12-11 DIAGNOSIS — E119 Type 2 diabetes mellitus without complications: Secondary | ICD-10-CM | POA: Diagnosis not present

## 2016-12-11 DIAGNOSIS — N529 Male erectile dysfunction, unspecified: Secondary | ICD-10-CM | POA: Diagnosis not present

## 2017-01-12 DIAGNOSIS — N5201 Erectile dysfunction due to arterial insufficiency: Secondary | ICD-10-CM | POA: Diagnosis not present

## 2017-01-12 DIAGNOSIS — N401 Enlarged prostate with lower urinary tract symptoms: Secondary | ICD-10-CM | POA: Diagnosis not present

## 2017-01-12 DIAGNOSIS — R3911 Hesitancy of micturition: Secondary | ICD-10-CM | POA: Diagnosis not present

## 2017-01-12 DIAGNOSIS — R972 Elevated prostate specific antigen [PSA]: Secondary | ICD-10-CM | POA: Diagnosis not present

## 2017-03-09 DIAGNOSIS — E785 Hyperlipidemia, unspecified: Secondary | ICD-10-CM | POA: Diagnosis not present

## 2017-03-09 DIAGNOSIS — E119 Type 2 diabetes mellitus without complications: Secondary | ICD-10-CM | POA: Diagnosis not present

## 2017-03-10 DIAGNOSIS — I10 Essential (primary) hypertension: Secondary | ICD-10-CM | POA: Diagnosis not present

## 2017-03-10 DIAGNOSIS — E119 Type 2 diabetes mellitus without complications: Secondary | ICD-10-CM | POA: Diagnosis not present

## 2017-03-10 DIAGNOSIS — E785 Hyperlipidemia, unspecified: Secondary | ICD-10-CM | POA: Diagnosis not present

## 2017-05-21 DIAGNOSIS — H524 Presbyopia: Secondary | ICD-10-CM | POA: Diagnosis not present

## 2017-05-21 DIAGNOSIS — H2513 Age-related nuclear cataract, bilateral: Secondary | ICD-10-CM | POA: Diagnosis not present

## 2017-05-21 DIAGNOSIS — E119 Type 2 diabetes mellitus without complications: Secondary | ICD-10-CM | POA: Diagnosis not present

## 2017-06-01 DIAGNOSIS — L821 Other seborrheic keratosis: Secondary | ICD-10-CM | POA: Diagnosis not present

## 2017-06-01 DIAGNOSIS — D3610 Benign neoplasm of peripheral nerves and autonomic nervous system, unspecified: Secondary | ICD-10-CM | POA: Diagnosis not present

## 2017-06-01 DIAGNOSIS — D235 Other benign neoplasm of skin of trunk: Secondary | ICD-10-CM | POA: Diagnosis not present

## 2017-06-01 DIAGNOSIS — D1801 Hemangioma of skin and subcutaneous tissue: Secondary | ICD-10-CM | POA: Diagnosis not present

## 2017-09-07 DIAGNOSIS — E119 Type 2 diabetes mellitus without complications: Secondary | ICD-10-CM | POA: Diagnosis not present

## 2017-09-08 DIAGNOSIS — I10 Essential (primary) hypertension: Secondary | ICD-10-CM | POA: Diagnosis not present

## 2017-09-08 DIAGNOSIS — E119 Type 2 diabetes mellitus without complications: Secondary | ICD-10-CM | POA: Diagnosis not present

## 2017-09-08 DIAGNOSIS — E785 Hyperlipidemia, unspecified: Secondary | ICD-10-CM | POA: Diagnosis not present

## 2017-10-30 DIAGNOSIS — D485 Neoplasm of uncertain behavior of skin: Secondary | ICD-10-CM | POA: Diagnosis not present

## 2017-10-30 DIAGNOSIS — L821 Other seborrheic keratosis: Secondary | ICD-10-CM | POA: Diagnosis not present

## 2017-10-30 DIAGNOSIS — Z85828 Personal history of other malignant neoplasm of skin: Secondary | ICD-10-CM | POA: Diagnosis not present

## 2017-10-30 DIAGNOSIS — L570 Actinic keratosis: Secondary | ICD-10-CM | POA: Diagnosis not present

## 2017-10-30 DIAGNOSIS — C44712 Basal cell carcinoma of skin of right lower limb, including hip: Secondary | ICD-10-CM | POA: Diagnosis not present

## 2017-10-30 DIAGNOSIS — Z23 Encounter for immunization: Secondary | ICD-10-CM | POA: Diagnosis not present

## 2017-11-03 DIAGNOSIS — Z23 Encounter for immunization: Secondary | ICD-10-CM | POA: Diagnosis not present

## 2017-12-15 ENCOUNTER — Other Ambulatory Visit: Payer: Self-pay

## 2017-12-15 DIAGNOSIS — C44712 Basal cell carcinoma of skin of right lower limb, including hip: Secondary | ICD-10-CM | POA: Diagnosis not present

## 2017-12-16 DIAGNOSIS — E785 Hyperlipidemia, unspecified: Secondary | ICD-10-CM | POA: Diagnosis not present

## 2017-12-16 DIAGNOSIS — Z125 Encounter for screening for malignant neoplasm of prostate: Secondary | ICD-10-CM | POA: Diagnosis not present

## 2017-12-16 DIAGNOSIS — E119 Type 2 diabetes mellitus without complications: Secondary | ICD-10-CM | POA: Diagnosis not present

## 2017-12-16 DIAGNOSIS — I10 Essential (primary) hypertension: Secondary | ICD-10-CM | POA: Diagnosis not present

## 2017-12-21 DIAGNOSIS — R066 Hiccough: Secondary | ICD-10-CM | POA: Diagnosis not present

## 2017-12-21 DIAGNOSIS — E1140 Type 2 diabetes mellitus with diabetic neuropathy, unspecified: Secondary | ICD-10-CM | POA: Diagnosis not present

## 2017-12-21 DIAGNOSIS — I10 Essential (primary) hypertension: Secondary | ICD-10-CM | POA: Diagnosis not present

## 2017-12-21 DIAGNOSIS — E785 Hyperlipidemia, unspecified: Secondary | ICD-10-CM | POA: Diagnosis not present

## 2017-12-21 DIAGNOSIS — N401 Enlarged prostate with lower urinary tract symptoms: Secondary | ICD-10-CM | POA: Diagnosis not present

## 2017-12-21 DIAGNOSIS — Z7982 Long term (current) use of aspirin: Secondary | ICD-10-CM | POA: Diagnosis not present

## 2017-12-21 DIAGNOSIS — Z23 Encounter for immunization: Secondary | ICD-10-CM | POA: Diagnosis not present

## 2017-12-21 DIAGNOSIS — Z0001 Encounter for general adult medical examination with abnormal findings: Secondary | ICD-10-CM | POA: Diagnosis not present

## 2018-01-05 DIAGNOSIS — Z4802 Encounter for removal of sutures: Secondary | ICD-10-CM | POA: Diagnosis not present

## 2018-01-05 DIAGNOSIS — T8149XA Infection following a procedure, other surgical site, initial encounter: Secondary | ICD-10-CM | POA: Diagnosis not present

## 2018-01-05 DIAGNOSIS — Z5189 Encounter for other specified aftercare: Secondary | ICD-10-CM | POA: Diagnosis not present

## 2018-04-05 DIAGNOSIS — E785 Hyperlipidemia, unspecified: Secondary | ICD-10-CM | POA: Diagnosis not present

## 2018-04-05 DIAGNOSIS — E119 Type 2 diabetes mellitus without complications: Secondary | ICD-10-CM | POA: Diagnosis not present

## 2018-04-06 DIAGNOSIS — Z111 Encounter for screening for respiratory tuberculosis: Secondary | ICD-10-CM | POA: Diagnosis not present

## 2018-04-06 DIAGNOSIS — I10 Essential (primary) hypertension: Secondary | ICD-10-CM | POA: Diagnosis not present

## 2018-04-06 DIAGNOSIS — E1140 Type 2 diabetes mellitus with diabetic neuropathy, unspecified: Secondary | ICD-10-CM | POA: Diagnosis not present

## 2018-04-06 DIAGNOSIS — R972 Elevated prostate specific antigen [PSA]: Secondary | ICD-10-CM | POA: Diagnosis not present

## 2018-04-06 DIAGNOSIS — E785 Hyperlipidemia, unspecified: Secondary | ICD-10-CM | POA: Diagnosis not present

## 2018-04-06 DIAGNOSIS — N401 Enlarged prostate with lower urinary tract symptoms: Secondary | ICD-10-CM | POA: Diagnosis not present

## 2018-08-16 DIAGNOSIS — E785 Hyperlipidemia, unspecified: Secondary | ICD-10-CM | POA: Diagnosis not present

## 2018-08-16 DIAGNOSIS — N401 Enlarged prostate with lower urinary tract symptoms: Secondary | ICD-10-CM | POA: Diagnosis not present

## 2018-08-16 DIAGNOSIS — E1140 Type 2 diabetes mellitus with diabetic neuropathy, unspecified: Secondary | ICD-10-CM | POA: Diagnosis not present

## 2018-08-16 DIAGNOSIS — I10 Essential (primary) hypertension: Secondary | ICD-10-CM | POA: Diagnosis not present

## 2018-08-17 DIAGNOSIS — R972 Elevated prostate specific antigen [PSA]: Secondary | ICD-10-CM | POA: Diagnosis not present

## 2018-08-17 DIAGNOSIS — E1140 Type 2 diabetes mellitus with diabetic neuropathy, unspecified: Secondary | ICD-10-CM | POA: Diagnosis not present

## 2018-08-17 DIAGNOSIS — N401 Enlarged prostate with lower urinary tract symptoms: Secondary | ICD-10-CM | POA: Diagnosis not present

## 2018-08-17 DIAGNOSIS — E785 Hyperlipidemia, unspecified: Secondary | ICD-10-CM | POA: Diagnosis not present

## 2018-08-17 DIAGNOSIS — I10 Essential (primary) hypertension: Secondary | ICD-10-CM | POA: Diagnosis not present

## 2018-10-27 DIAGNOSIS — N401 Enlarged prostate with lower urinary tract symptoms: Secondary | ICD-10-CM | POA: Diagnosis not present

## 2018-10-27 DIAGNOSIS — N5201 Erectile dysfunction due to arterial insufficiency: Secondary | ICD-10-CM | POA: Diagnosis not present

## 2018-10-27 DIAGNOSIS — R3915 Urgency of urination: Secondary | ICD-10-CM | POA: Diagnosis not present

## 2018-10-27 DIAGNOSIS — R972 Elevated prostate specific antigen [PSA]: Secondary | ICD-10-CM | POA: Diagnosis not present

## 2018-11-10 DIAGNOSIS — Z23 Encounter for immunization: Secondary | ICD-10-CM | POA: Diagnosis not present

## 2018-11-12 DIAGNOSIS — Z23 Encounter for immunization: Secondary | ICD-10-CM | POA: Diagnosis not present

## 2018-11-12 DIAGNOSIS — Z85828 Personal history of other malignant neoplasm of skin: Secondary | ICD-10-CM | POA: Diagnosis not present

## 2018-11-12 DIAGNOSIS — L821 Other seborrheic keratosis: Secondary | ICD-10-CM | POA: Diagnosis not present

## 2018-12-22 ENCOUNTER — Other Ambulatory Visit: Payer: Self-pay

## 2018-12-27 DIAGNOSIS — E118 Type 2 diabetes mellitus with unspecified complications: Secondary | ICD-10-CM | POA: Diagnosis not present

## 2018-12-27 DIAGNOSIS — E119 Type 2 diabetes mellitus without complications: Secondary | ICD-10-CM | POA: Diagnosis not present

## 2018-12-27 DIAGNOSIS — I10 Essential (primary) hypertension: Secondary | ICD-10-CM | POA: Diagnosis not present

## 2019-02-08 DIAGNOSIS — L603 Nail dystrophy: Secondary | ICD-10-CM | POA: Diagnosis not present

## 2019-02-08 DIAGNOSIS — B356 Tinea cruris: Secondary | ICD-10-CM | POA: Diagnosis not present

## 2019-02-08 DIAGNOSIS — Z23 Encounter for immunization: Secondary | ICD-10-CM | POA: Diagnosis not present

## 2019-02-21 ENCOUNTER — Emergency Department (HOSPITAL_BASED_OUTPATIENT_CLINIC_OR_DEPARTMENT_OTHER): Payer: PPO

## 2019-02-21 ENCOUNTER — Encounter (HOSPITAL_COMMUNITY): Payer: Self-pay | Admitting: Emergency Medicine

## 2019-02-21 ENCOUNTER — Other Ambulatory Visit: Payer: Self-pay | Admitting: Adult Health

## 2019-02-21 ENCOUNTER — Inpatient Hospital Stay (HOSPITAL_COMMUNITY)
Admission: EM | Admit: 2019-02-21 | Discharge: 2019-02-25 | DRG: 177 | Disposition: A | Discharge status: Home Health | Payer: PPO | Attending: Internal Medicine | Admitting: Internal Medicine

## 2019-02-21 ENCOUNTER — Emergency Department (HOSPITAL_COMMUNITY): Payer: PPO

## 2019-02-21 ENCOUNTER — Encounter (HOSPITAL_BASED_OUTPATIENT_CLINIC_OR_DEPARTMENT_OTHER): Payer: Self-pay | Admitting: *Deleted

## 2019-02-21 ENCOUNTER — Other Ambulatory Visit: Payer: Self-pay

## 2019-02-21 ENCOUNTER — Emergency Department (HOSPITAL_BASED_OUTPATIENT_CLINIC_OR_DEPARTMENT_OTHER)
Admission: EM | Admit: 2019-02-21 | Discharge: 2019-02-21 | Disposition: A | Discharge status: Home / Self Care | Payer: PPO | Source: Home / Self Care | Attending: Emergency Medicine | Admitting: Emergency Medicine

## 2019-02-21 DIAGNOSIS — J9601 Acute respiratory failure with hypoxia: Secondary | ICD-10-CM | POA: Diagnosis present

## 2019-02-21 DIAGNOSIS — R112 Nausea with vomiting, unspecified: Secondary | ICD-10-CM | POA: Diagnosis not present

## 2019-02-21 DIAGNOSIS — R509 Fever, unspecified: Secondary | ICD-10-CM | POA: Diagnosis not present

## 2019-02-21 DIAGNOSIS — I959 Hypotension, unspecified: Secondary | ICD-10-CM | POA: Diagnosis not present

## 2019-02-21 DIAGNOSIS — J1282 Pneumonia due to coronavirus disease 2019: Secondary | ICD-10-CM | POA: Diagnosis present

## 2019-02-21 DIAGNOSIS — D638 Anemia in other chronic diseases classified elsewhere: Secondary | ICD-10-CM | POA: Diagnosis present

## 2019-02-21 DIAGNOSIS — E860 Dehydration: Secondary | ICD-10-CM | POA: Diagnosis not present

## 2019-02-21 DIAGNOSIS — Z7984 Long term (current) use of oral hypoglycemic drugs: Secondary | ICD-10-CM

## 2019-02-21 DIAGNOSIS — R066 Hiccough: Secondary | ICD-10-CM | POA: Diagnosis present

## 2019-02-21 DIAGNOSIS — T380X5A Adverse effect of glucocorticoids and synthetic analogues, initial encounter: Secondary | ICD-10-CM | POA: Diagnosis present

## 2019-02-21 DIAGNOSIS — J988 Other specified respiratory disorders: Secondary | ICD-10-CM | POA: Diagnosis present

## 2019-02-21 DIAGNOSIS — R5381 Other malaise: Secondary | ICD-10-CM | POA: Diagnosis not present

## 2019-02-21 DIAGNOSIS — E871 Hypo-osmolality and hyponatremia: Secondary | ICD-10-CM

## 2019-02-21 DIAGNOSIS — R69 Illness, unspecified: Secondary | ICD-10-CM | POA: Diagnosis not present

## 2019-02-21 DIAGNOSIS — E1165 Type 2 diabetes mellitus with hyperglycemia: Secondary | ICD-10-CM | POA: Diagnosis not present

## 2019-02-21 DIAGNOSIS — Z7982 Long term (current) use of aspirin: Secondary | ICD-10-CM | POA: Diagnosis not present

## 2019-02-21 DIAGNOSIS — I10 Essential (primary) hypertension: Secondary | ICD-10-CM | POA: Diagnosis not present

## 2019-02-21 DIAGNOSIS — R270 Ataxia, unspecified: Secondary | ICD-10-CM | POA: Diagnosis not present

## 2019-02-21 DIAGNOSIS — R0902 Hypoxemia: Secondary | ICD-10-CM | POA: Diagnosis not present

## 2019-02-21 DIAGNOSIS — R0602 Shortness of breath: Secondary | ICD-10-CM | POA: Diagnosis not present

## 2019-02-21 DIAGNOSIS — R531 Weakness: Secondary | ICD-10-CM | POA: Diagnosis not present

## 2019-02-21 DIAGNOSIS — E861 Hypovolemia: Secondary | ICD-10-CM | POA: Diagnosis present

## 2019-02-21 DIAGNOSIS — U071 COVID-19: Principal | ICD-10-CM | POA: Diagnosis present

## 2019-02-21 DIAGNOSIS — E785 Hyperlipidemia, unspecified: Secondary | ICD-10-CM | POA: Diagnosis not present

## 2019-02-21 DIAGNOSIS — E119 Type 2 diabetes mellitus without complications: Secondary | ICD-10-CM | POA: Insufficient documentation

## 2019-02-21 DIAGNOSIS — E876 Hypokalemia: Secondary | ICD-10-CM | POA: Diagnosis not present

## 2019-02-21 DIAGNOSIS — Z79899 Other long term (current) drug therapy: Secondary | ICD-10-CM

## 2019-02-21 DIAGNOSIS — Z209 Contact with and (suspected) exposure to unspecified communicable disease: Secondary | ICD-10-CM | POA: Diagnosis not present

## 2019-02-21 DIAGNOSIS — R110 Nausea: Secondary | ICD-10-CM | POA: Diagnosis not present

## 2019-02-21 HISTORY — DX: Type 2 diabetes mellitus without complications: E11.9

## 2019-02-21 HISTORY — DX: Essential (primary) hypertension: I10

## 2019-02-21 LAB — CBC WITH DIFFERENTIAL/PLATELET
Abs Immature Granulocytes: 0.02 10*3/uL (ref 0.00–0.07)
Basophils Absolute: 0 10*3/uL (ref 0.0–0.1)
Basophils Relative: 0 %
Eosinophils Absolute: 0 10*3/uL (ref 0.0–0.5)
Eosinophils Relative: 0 %
HCT: 35.7 % — ABNORMAL LOW (ref 39.0–52.0)
Hemoglobin: 12.4 g/dL — ABNORMAL LOW (ref 13.0–17.0)
Immature Granulocytes: 0 %
Lymphocytes Relative: 12 %
Lymphs Abs: 0.7 10*3/uL (ref 0.7–4.0)
MCH: 31.6 pg (ref 26.0–34.0)
MCHC: 34.7 g/dL (ref 30.0–36.0)
MCV: 91.1 fL (ref 80.0–100.0)
Monocytes Absolute: 0.4 10*3/uL (ref 0.1–1.0)
Monocytes Relative: 6 %
Neutro Abs: 4.8 10*3/uL (ref 1.7–7.7)
Neutrophils Relative %: 82 %
Platelets: 130 10*3/uL — ABNORMAL LOW (ref 150–400)
RBC: 3.92 MIL/uL — ABNORMAL LOW (ref 4.22–5.81)
RDW: 11.8 % (ref 11.5–15.5)
WBC: 5.9 10*3/uL (ref 4.0–10.5)
nRBC: 0 % (ref 0.0–0.2)

## 2019-02-21 LAB — BASIC METABOLIC PANEL
Anion gap: 10 (ref 5–15)
BUN: 15 mg/dL (ref 8–23)
CO2: 22 mmol/L (ref 22–32)
Calcium: 7.9 mg/dL — ABNORMAL LOW (ref 8.9–10.3)
Chloride: 96 mmol/L — ABNORMAL LOW (ref 98–111)
Creatinine, Ser: 1.06 mg/dL (ref 0.61–1.24)
GFR calc Af Amer: >60 mL/min (ref >60)
GFR calc non Af Amer: >60 mL/min (ref >60)
Glucose, Bld: 205 mg/dL — ABNORMAL HIGH (ref 70–99)
Potassium: 3.6 mmol/L (ref 3.5–5.1)
Sodium: 128 mmol/L — ABNORMAL LOW (ref 135–145)

## 2019-02-21 LAB — SARS CORONAVIRUS 2 AG (30 MIN TAT): SARS Coronavirus 2 Ag: POSITIVE — AB

## 2019-02-21 LAB — FERRITIN: Ferritin: 196 ng/mL (ref 24–336)

## 2019-02-21 LAB — COMPREHENSIVE METABOLIC PANEL
ALT: 21 U/L (ref 0–44)
AST: 25 U/L (ref 15–41)
Albumin: 3.2 g/dL — ABNORMAL LOW (ref 3.5–5.0)
Alkaline Phosphatase: 38 U/L (ref 38–126)
Anion gap: 9 (ref 5–15)
BUN: 12 mg/dL (ref 8–23)
CO2: 23 mmol/L (ref 22–32)
Calcium: 8.3 mg/dL — ABNORMAL LOW (ref 8.9–10.3)
Chloride: 95 mmol/L — ABNORMAL LOW (ref 98–111)
Creatinine, Ser: 0.95 mg/dL (ref 0.61–1.24)
GFR calc Af Amer: >60 mL/min (ref >60)
GFR calc non Af Amer: >60 mL/min (ref >60)
Glucose, Bld: 212 mg/dL — ABNORMAL HIGH (ref 70–99)
Potassium: 3.9 mmol/L (ref 3.5–5.1)
Sodium: 127 mmol/L — ABNORMAL LOW (ref 135–145)
Total Bilirubin: 2 mg/dL — ABNORMAL HIGH (ref 0.3–1.2)
Total Protein: 6.5 g/dL (ref 6.5–8.1)

## 2019-02-21 LAB — LACTIC ACID, PLASMA: Lactic Acid, Venous: 1.3 mmol/L (ref 0.5–1.9)

## 2019-02-21 LAB — C-REACTIVE PROTEIN: CRP: 10.1 mg/dL — ABNORMAL HIGH (ref <1.0)

## 2019-02-21 LAB — TRIGLYCERIDES: Triglycerides: 65 mg/dL (ref <150)

## 2019-02-21 LAB — FIBRINOGEN: Fibrinogen: 490 mg/dL — ABNORMAL HIGH (ref 210–475)

## 2019-02-21 LAB — LACTATE DEHYDROGENASE: LDH: 170 U/L (ref 98–192)

## 2019-02-21 LAB — PROCALCITONIN: Procalcitonin: <0.1 ng/mL

## 2019-02-21 LAB — D-DIMER, QUANTITATIVE: D-Dimer, Quant: 1.1 ug/mL-FEU — ABNORMAL HIGH (ref 0.00–0.50)

## 2019-02-21 MED ORDER — ACETAMINOPHEN 325 MG PO TABS
650.0000 mg | ORAL_TABLET | Freq: Once | ORAL | Status: AC
  Administered 2019-02-21: 20:00:00 650 mg via ORAL
  Filled 2019-02-21: qty 2

## 2019-02-21 MED ORDER — ENOXAPARIN SODIUM 40 MG/0.4ML ~~LOC~~ SOLN
40.0000 mg | SUBCUTANEOUS | Status: DC
  Administered 2019-02-22 – 2019-02-24 (×2): 40 mg via SUBCUTANEOUS
  Filled 2019-02-21 (×3): qty 0.4

## 2019-02-21 MED ORDER — ACETAMINOPHEN 325 MG PO TABS
650.0000 mg | ORAL_TABLET | Freq: Once | ORAL | Status: AC
  Administered 2019-02-21: 650 mg via ORAL
  Filled 2019-02-21: qty 2

## 2019-02-21 MED ORDER — SODIUM CHLORIDE 0.9 % IV BOLUS
1000.0000 mL | Freq: Once | INTRAVENOUS | Status: AC
  Administered 2019-02-21: 1000 mL via INTRAVENOUS

## 2019-02-21 MED ORDER — CHLORPROMAZINE HCL 25 MG PO TABS
25.0000 mg | ORAL_TABLET | Freq: Once | ORAL | Status: AC
  Administered 2019-02-21: 25 mg via ORAL
  Filled 2019-02-21: qty 1

## 2019-02-21 MED ORDER — ONDANSETRON 4 MG PO TBDP: 4.0000 mg | ORAL_TABLET | Freq: Three times a day (TID) | ORAL | 0 refills | Status: DC | PRN

## 2019-02-21 MED ORDER — SODIUM CHLORIDE 0.9 % IV SOLN: INTRAVENOUS | Status: DC

## 2019-02-21 MED ORDER — SODIUM CHLORIDE 0.9 % IV SOLN
200.0000 mg | Freq: Once | INTRAVENOUS | Status: AC
  Administered 2019-02-21: 22:00:00 200 mg via INTRAVENOUS
  Filled 2019-02-21: qty 200

## 2019-02-21 MED ORDER — ONDANSETRON HCL 4 MG/2ML IJ SOLN
4.0000 mg | Freq: Once | INTRAMUSCULAR | Status: AC
  Administered 2019-02-21: 4 mg via INTRAVENOUS
  Filled 2019-02-21: qty 2

## 2019-02-21 MED ORDER — CHLORPROMAZINE HCL 25 MG PO TABS: 25.0000 mg | ORAL_TABLET | Freq: Three times a day (TID) | ORAL | 0 refills | Status: DC | PRN

## 2019-02-21 MED ORDER — SODIUM CHLORIDE 0.9 % IV SOLN
100.0000 mg | Freq: Every day | INTRAVENOUS | Status: AC
  Administered 2019-02-22 – 2019-02-25 (×4): 100 mg via INTRAVENOUS
  Filled 2019-02-21 (×4): qty 20

## 2019-02-21 NOTE — ED Notes (Signed)
[facility] called. 

## 2019-02-21 NOTE — ED Provider Notes (Signed)
[facility] EMERGENCY DEPARTMENT Provider Note   CSN: [id number] Arrival date & time: 02/21/19  [id number]     History Chief Complaint  Patient presents with  . covid symptoms    Jose Higgins is a 79 y.o. male.  HPI     This is a 79 year old male with a history of diabetes and hypertension who presents with nausea, hiccups, myalgias.  Patient reports he has had symptoms since Wednesday.  He reports no known Covid exposure last Friday and Saturday.  He states that he tested negative this past Thursday.  Symptoms started on Wednesday.  He reports full body myalgias.  He reports temperatures to 101.  Additionally he reports ongoing nausea.  No vomiting or abdominal pain.  He has had hiccups since that time as well.  He reports an ongoing cough but nothing new.  Denies any shortness of breath, chest pain.  He reports taking an over-the-counter medication with no relief. Past Medical History:  Diagnosis Date  . Diabetes mellitus without complication ([place])   . Hypertension     There are no problems to display for this patient.   History reviewed. No pertinent surgical history.     No family history on file.  Social History   Tobacco Use  . Smoking status: Never Smoker  . Smokeless tobacco: Never Used  Substance Use Topics  . Alcohol use: Never  . Drug use: Never    Home Medications Prior to Admission medications   Medication Sig Start Date End Date Taking? Authorizing Provider  alfuzosin (UROXATRAL) 10 MG 24 hr tablet Take 10 mg by mouth daily with breakfast.   Yes [provider]  atorvastatin (LIPITOR) 20 MG tablet Take 20 mg by mouth daily.   Yes [provider]  cetirizine (ZYRTEC) 10 MG tablet Take 10 mg by mouth daily.   Yes [provider]  metoprolol tartrate (LOPRESSOR) 50 MG tablet Take 50 mg by mouth 2 (two) times daily.   Yes [provider]  sitaGLIPtin-metformin (JANUMET) 50-1000 MG tablet Take 1 tablet by mouth 2  (two) times daily with a meal.   Yes [provider]  chlorproMAZINE (THORAZINE) 25 MG tablet Take 1 tablet (25 mg total) by mouth 3 (three) times daily as needed for hiccoughs. 02/21/19   Kimberla Driskill, Barbette Hair, MD  ondansetron (ZOFRAN ODT) 4 MG disintegrating tablet Take 1 tablet (4 mg total) by mouth every 8 (eight) hours as needed for nausea or vomiting. 02/21/19   Keely Drennan, Barbette Hair, MD    Allergies    Patient has no allergy information on record.  Review of Systems   Review of Systems  Constitutional: Positive for chills and fever.  Respiratory: Positive for cough. Negative for shortness of breath.   Cardiovascular: Negative for chest pain.  Gastrointestinal: Positive for nausea. Negative for abdominal pain, diarrhea and vomiting.  Genitourinary: Negative for dysuria.  Musculoskeletal: Positive for myalgias.  Neurological: Positive for light-headedness.  All other systems reviewed and are negative.   Physical Exam Updated Vital Signs BP (!) 151/87 (BP Location: Right Arm)   Pulse 71   Temp (!) 101.2 F (38.4 C) (Oral)   Resp (!) 25   Ht 1.778 m (5\' 10" )   Wt 91.6 kg   SpO2 92%   BMI 28.98 kg/m   Physical Exam Vitals and nursing note reviewed.  Constitutional:      General: He is not in acute distress.    Appearance: He is well-developed.  HENT:  Head: Normocephalic and atraumatic.     Mouth/Throat:     Mouth: Mucous membranes are dry.  Eyes:     Pupils: Pupils are equal, round, and reactive to light.  Cardiovascular:     Rate and Rhythm: Normal rate and regular rhythm.  Pulmonary:     Effort: Pulmonary effort is normal. No respiratory distress.     Breath sounds: Normal breath sounds. No wheezing.     Comments: Slight tachypnea noted, speaking in full sentences, hiccups Abdominal:     General: Bowel sounds are normal.     Palpations: Abdomen is soft.     Tenderness: There is no abdominal tenderness. There is no guarding or rebound.  Musculoskeletal:       Cervical back: Neck supple.     Right lower leg: No edema.     Left lower leg: No edema.  Skin:    General: Skin is warm and dry.  Neurological:     Mental Status: He is alert and oriented to person, place, and time.  Psychiatric:        Mood and Affect: Mood normal.     ED Results / Procedures / Treatments   Labs (all labs ordered are listed, but only abnormal results are displayed) Labs Reviewed  SARS CORONAVIRUS 2 AG (30 MIN TAT) - Abnormal; Notable for the following components:      Result Value   SARS Coronavirus 2 Ag POSITIVE (*)    All other components within normal limits  CBC WITH DIFFERENTIAL/PLATELET - Abnormal; Notable for the following components:   RBC 3.92 (*)    Hemoglobin 12.4 (*)    HCT 35.7 (*)    Platelets 130 (*)    All other components within normal limits  COMPREHENSIVE METABOLIC PANEL - Abnormal; Notable for the following components:   Sodium 127 (*)    Chloride 95 (*)    Glucose, Bld 212 (*)    Calcium 8.3 (*)    Albumin 3.2 (*)    Total Bilirubin 2.0 (*)    All other components within normal limits  D-DIMER, QUANTITATIVE (NOT AT [facility]) - Abnormal; Notable for the following components:   D-Dimer, Quant 1.10 (*)    All other components within normal limits  FIBRINOGEN - Abnormal; Notable for the following components:   Fibrinogen 490 (*)    All other components within normal limits  C-REACTIVE PROTEIN - Abnormal; Notable for the following components:   CRP 10.1 (*)    All other components within normal limits  CULTURE, BLOOD (ROUTINE X 2)  CULTURE, BLOOD (ROUTINE X 2)  LACTIC ACID, PLASMA  LACTATE DEHYDROGENASE  FERRITIN  TRIGLYCERIDES  PROCALCITONIN    EKG EKG Interpretation  Date/Time:  Monday February 21 2019 00:39:56 EST Ventricular Rate:  70 PR Interval:    QRS Duration: 83 QT Interval:  396 QTC Calculation: 428 R Axis:   14 Text Interpretation: Sinus rhythm Atrial premature complex Confirmed by Thayer Jew [phone]) on  02/21/2019 1:59:12 AM   Radiology DG Chest Port 1 View  Result Date: 02/21/2019 CLINICAL DATA:  Fever and recent COVID exposure EXAM: PORTABLE CHEST 1 VIEW COMPARISON:  None. FINDINGS: The heart size and mediastinal contours are within normal limits. Both lungs are clear. The visualized skeletal structures are unremarkable. IMPRESSION: No active disease. Electronically Signed   By: Ulyses Jarred M.D.   On: 02/21/2019 01:53    Procedures Procedures (including critical care time)  Medications Ordered in ED Medications  ondansetron (ZOFRAN) injection 4  mg (4 mg Intravenous Given 02/21/19 0111)  acetaminophen (TYLENOL) tablet 650 mg (650 mg Oral Given 02/21/19 0111)  chlorproMAZINE (THORAZINE) tablet 25 mg (25 mg Oral Given 02/21/19 0111)  sodium chloride 0.9 % bolus 1,000 mL (0 mLs Intravenous Stopped 02/21/19 0306)    ED Course  I have reviewed the triage vital signs and the nursing notes.  Pertinent labs & imaging results that were available during my care of the patient were reviewed by me and considered in my medical decision making (see chart for details).    MDM Rules/Calculators/A&P                      Patient presents today with symptoms consistent with COVID-19.  Initially febrile and mildly tachypneic.  O2 sats 92%.  Patient is in no respiratory distress.  His main complaint is nausea and hiccups as well as myalgias.  Lab work obtained.  Chest x-ray shows no evidence of pneumonia.  EKG without arrhythmia or ischemic changes.  Lab work-up is notable for hyponatremia with a sodium of 127.  Patient was given fluids.  He has mild elevations in CRP, fibrinogen, and D-dimer.  Lactate is normal.  Doubt sepsis.  Patient has remained clinically stable while in the emergency department.  O2 sats 92 to 94% on room air.  He is not short of breath.  I discussed with patient his positive COVID-19 test results.  He was offered admission given borderline O2 sats and evidence of hyponatremia  dehydration.  Patient declined.  He was advised that he may get worse and he will need to be reevaluated if he has any new or worsening symptoms particularly shortness of breath or cough.  Patient stated understanding.  I discussed with him the natural course of Covid to get worse on days 8-10 of illness.  Patient stated understanding.  Will discharge with Zofran and Thorazine.  Jose Higgins was evaluated in Emergency Department on 02/21/2019 for the symptoms described in the history of present illness. He was evaluated in the context of the global COVID-19 pandemic, which necessitated consideration that the patient might be at risk for infection with the SARS-CoV-2 virus that causes COVID-19. Institutional protocols and algorithms that pertain to the evaluation of patients at risk for COVID-19 are in a state of rapid change based on information released by regulatory bodies including the CDC and federal and state organizations. These policies and algorithms were followed during the patient's care in the ED.  Final Clinical Impression(s) / ED Diagnoses Final diagnoses:  COVID-19  Hyponatremia  Non-intractable vomiting with nausea, unspecified vomiting type    Rx / DC Orders ED Discharge Orders         Ordered    ondansetron (ZOFRAN ODT) 4 MG disintegrating tablet  Every 8 hours PRN     02/21/19 0349    chlorproMAZINE (THORAZINE) 25 MG tablet  3 times daily PRN     02/21/19 0349           Leondro Coryell, Barbette Hair, MD 02/21/19 [phone]

## 2019-02-21 NOTE — Discharge Instructions (Addendum)
You were seen today for COVID-19 symptoms.  Your testing confirmed positive for COVID-19.  Take medications at home as prescribed for symptoms.  You may meet criteria for infusion clinic.  Make sure to answer any phone call from [facility] health.  If you develop worsening symptoms, shortness of breath, cough, nausea vomiting, you should be reevaluated immediately.

## 2019-02-21 NOTE — Progress Notes (Signed)
  I connected by phone with Jose Higgins on 02/21/2019 at 1:28 PM to discuss the potential use of an new treatment for mild to moderate COVID-19 viral infection in non-hospitalized patients.  This patient is a 79 y.o. male that meets the FDA criteria for Emergency Use Authorization of bamlanivimab or casirivimab\imdevimab.  Has a (+) direct SARS-CoV-2 viral test result  Has mild or moderate COVID-19   Is ? 80 years of age and weighs ? 40 kg  Is NOT hospitalized due to COVID-19  Is NOT requiring oxygen therapy or requiring an increase in baseline oxygen flow rate due to COVID-19  Is within 10 days of symptom onset  Has at least one of the high risk factor(s) for progression to severe COVID-19 and/or hospitalization as defined in EUA.  Specific high risk criteria : >/= 80 yo   I have spoken and communicated the following to the patient or parent/caregiver:  COVID 19 vaccine #1 was on 02/08/19 . Aware that the MAB infusion benefit /risk after vaccine is unknown. Agree that the risk of Covid 19 in high risk group is greater so wishes to proceed with MAB.   1. FDA has authorized the emergency use of bamlanivimab and casirivimab\imdevimab for the treatment of mild to moderate COVID-19 in adults and pediatric patients with positive results of direct SARS-CoV-2 viral testing who are 73 years of age and older weighing at least 40 kg, and who are at high risk for progressing to severe COVID-19 and/or hospitalization.  2. The significant known and potential risks and benefits of bamlanivimab and casirivimab\imdevimab, and the extent to which such potential risks and benefits are unknown.  3. Information on available alternative treatments and the risks and benefits of those alternatives, including clinical trials.  4. Patients treated with bamlanivimab and casirivimab\imdevimab should continue to self-isolate and use infection control measures (e.g., wear mask, isolate, social distance, avoid  sharing personal items, clean and disinfect "high touch" surfaces, and frequent handwashing) according to CDC guidelines.   5. The patient or parent/caregiver has the option to accept or refuse bamlanivimab or casirivimab\imdevimab .  After reviewing this information with the patient, The patient agreed to proceed with receiving the bamlanimivab infusion and will be provided a copy of the Fact sheet prior to receiving the infusion.[name] 02/21/2019 1:28 PM

## 2019-02-21 NOTE — H&P (Addendum)
History and Physical    Jose Higgins [id number] DOB: 21-Sep-1940 DOA: 02/21/2019  PCP: System, Provider Not In Patient coming from: Home  I have personally briefly reviewed patient's old medical records in [place]  Chief Complaint: Nausea, vomiting  HPI: Jose Higgins is a 79 y.o. male with medical history significant for HTN and DM2 who presented with persistent nausea and vomiting with known diagnosis of Covid (positive test earlier in the AM on 1/25). He was initially discharged home from the ED in the early morning on 1/25 after volume resuscitation and then returned shortly thereafter still feeling poorly. He reports 3-4 days of fever, myalgias, cough productive of yellow sputum, shortness of breath, nausea and vomiting. He also endorses decreased oral intake over that time. He is unsure of any specific Covid exposures but suspects that his wife also has Covid.  ED Course: Patient febrile to 102.52F, otherwise hemodynamically stable. Requiring 2L/min supplemental O2 to maintain SpO2 >92%. Labs notable for Na 127, TBili 2.0, d-dimer 1100. Lactic and procalcitonin are not elevated. CXR shows peripheral left lower lobe opacity. He received Zofran and Tylenol while in the ED.  Review of Systems: As per HPI otherwise 10 point review of systems negative.   Past Medical History:  Diagnosis Date  . Diabetes mellitus without complication ([place])   . Hypertension     History reviewed. No pertinent surgical history.   reports that he has never smoked. He has never used smokeless tobacco. He reports that he does not drink alcohol or use drugs.  No Known Allergies  History reviewed. No pertinent family history.   Prior to Admission medications   Medication Sig Start Date End Date Taking? Authorizing Provider  acetaminophen (TYLENOL) 325 MG tablet Take 650 mg by mouth every 6 (six) hours as needed for moderate pain or fever.   Yes [provider]  alfuzosin (UROXATRAL) 10  MG 24 hr tablet Take 10 mg by mouth daily with breakfast.   Yes [provider]  aspirin EC 81 MG tablet Take 81 mg by mouth daily.   Yes [provider]  cetirizine (ZYRTEC) 10 MG tablet Take 10 mg by mouth daily.   Yes [provider]  guaiFENesin (MUCINEX) 600 MG 12 hr tablet Take 600 mg by mouth 2 (two) times daily.   Yes [provider]  ipratropium (ATROVENT) 0.06 % nasal spray Place 2 sprays into both nostrils 3 (three) times daily as needed for rhinitis.  12/30/18  Yes [provider]  irbesartan (AVAPRO) 150 MG tablet Take 150 mg by mouth daily.  10/08/18  Yes [provider]  metoprolol tartrate (LOPRESSOR) 50 MG tablet Take 50 mg by mouth 2 (two) times daily.   Yes [provider]  sitaGLIPtin-metformin (JANUMET) 50-1000 MG tablet Take 1 tablet by mouth 2 (two) times daily with a meal.   Yes [provider]  chlorproMAZINE (THORAZINE) 25 MG tablet Take 1 tablet (25 mg total) by mouth 3 (three) times daily as needed for hiccoughs. 02/21/19   Horton, Barbette Hair, MD  ondansetron (ZOFRAN ODT) 4 MG disintegrating tablet Take 1 tablet (4 mg total) by mouth every 8 (eight) hours as needed for nausea or vomiting. 02/21/19   Merryl Hacker, MD    Physical Exam: Vitals:   02/21/19 1800 02/21/19 1810  BP: (!) 135/53   Pulse: 71   Resp: (!) 25   Temp: (!) 102.6 F (39.2 C)   TempSrc: Oral   SpO2: 94%  Weight: 87.1 kg 87.1 kg  Height: 5\' 10"  (1.778 m) 5\' 10"  (1.778 m)    Constitutional: NAD, calm, comfortable Eyes: PERRL, lids and conjunctivae normal ENMT: Mucous membranes are moist. Posterior pharynx clear of any exudate or lesions. Neck: normal, supple, no masses Respiratory: clear to auscultation bilaterally, no wheezing, no crackles. Normal respiratory effort Cardiovascular: Regular rate and rhythm, no murmurs / rubs / gallops. No extremity edema. 2+ pedal pulses Abdomen: no tenderness, no masses palpated.  Bowel sounds positive.  Musculoskeletal: no clubbing / cyanosis. No joint deformity upper and lower extremities. Good ROM, no contractures. Normal muscle tone.  Skin: no rashes, lesions, ulcers. No induration Neurologic: CN 2-12 grossly intact. Strength 5/5 in all 4.  Psychiatric: Somewhat slowed mentation. Distractible. Alert and oriented. Odd affect.    Labs on Admission: I have personally reviewed following labs and imaging studies  CBC: Recent Labs  Lab 02/21/19 0109  WBC 5.9  NEUTROABS 4.8  HGB 12.4*  HCT 35.7*  MCV 91.1  PLT [id number]*   Basic Metabolic Panel: Recent Labs  Lab 02/21/19 0109  NA 127*  K 3.9  CL 95*  CO2 23  GLUCOSE 212*  BUN 12  CREATININE 0.95  CALCIUM 8.3*   GFR: Estimated Creatinine Clearance: 65.1 mL/min (by C-G formula based on SCr of 0.95 mg/dL). Liver Function Tests: Recent Labs  Lab 02/21/19 0109  AST 25  ALT 21  ALKPHOS 38  BILITOT 2.0*  PROT 6.5  ALBUMIN 3.2*   No results for input(s): LIPASE, AMYLASE in the last 168 hours. No results for input(s): AMMONIA in the last 168 hours. Coagulation Profile: No results for input(s): INR, PROTIME in the last 168 hours. Cardiac Enzymes: No results for input(s): CKTOTAL, CKMB, CKMBINDEX, TROPONINI in the last 168 hours. BNP (last 3 results) No results for input(s): PROBNP in the last 8760 hours. HbA1C: No results for input(s): HGBA1C in the last 72 hours. CBG: No results for input(s): GLUCAP in the last 168 hours. Lipid Profile: Recent Labs    02/21/19 0109  TRIG 65   Thyroid Function Tests: No results for input(s): TSH, T4TOTAL, FREET4, T3FREE, THYROIDAB in the last 72 hours. Anemia Panel: Recent Labs    02/21/19 0109  FERRITIN 196   Urine analysis: No results found for: COLORURINE, APPEARANCEUR, LABSPEC, PHURINE, GLUCOSEU, HGBUR, BILIRUBINUR, KETONESUR, PROTEINUR, UROBILINOGEN, NITRITE, LEUKOCYTESUR  Radiological Exams on Admission: DG Chest Port 1 View  Result Date:  02/21/2019 CLINICAL DATA:  Shortness of breath EXAM: PORTABLE CHEST 1 VIEW COMPARISON:  02/21/2019 FINDINGS: Patchy airspace opacity in the left mid to lower lung. No pleural effusion. Normal heart size. No pneumothorax. IMPRESSION: Vague patchy opacity in the left mid to lower lung may reflect mild pneumonia Electronically Signed   By: Donavan Foil M.D.   On: 02/21/2019 18:40   DG Chest Port 1 View  Result Date: 02/21/2019 CLINICAL DATA:  Fever and recent COVID exposure EXAM: PORTABLE CHEST 1 VIEW COMPARISON:  None. FINDINGS: The heart size and mediastinal contours are within normal limits. Both lungs are clear. The visualized skeletal structures are unremarkable. IMPRESSION: No active disease. Electronically Signed   By: Ulyses Jarred M.D.   On: 02/21/2019 01:53    EKG: Independently reviewed. Sinus rhythm, no ST-T changes.  Assessment/Plan Active Problems:   Respiratory tract infection due to COVID-19 virus   AHRF 2/2 Covid-19 infection - Start remdesivir, Decadron - Lovenox per protocol; follow d-dimer - No indication for CAP coverage - Albuterol HFA PRN - SpO2 goal >/=  90% - Antitussives PRN - Zinc, Vit C supplementation - Daily CBC, CMP  Hypovolemic hyponatremia in the setting of nausea, vomiting, poor PO intake - Volume resuscitation - Check serum, urine osmolality, urine lytes - Trend serum sodium, goal ~6 mEq increase over 24 h - Antiemetics PRN - Hold ARB in setting of hyponatremia  Chronic medical conditions: - HTN: continue home Lopressor, hold irbesartan as above - DM2: SSI, FSBS ACHS, diabetic diet  DVT prophylaxis: Lovenox Code Status: Full Disposition Plan: Home in 2-3 days pending clinical improvement Consults called: None Admission status: Inpatient   Bennie Pierini MD [facility]  If 7PM-7AM, please contact night-coverage [email or web address] Password [facility]  02/21/2019, 8:40 PM

## 2019-02-21 NOTE — ED Notes (Signed)
Attempted to call report. AC will contact receiving floor to call me back for report.

## 2019-02-21 NOTE — ED Triage Notes (Signed)
Patient presents with nausea, vomiting and diarrhea which has worsened since yesterday, He was tested and found to be Covid + yesterday. He has not had significant PO intake in 2-3 days. The patient also complains of hiccups. EMS administered 4 mg of Zofran.    EMS vitals: 94% O2 sat on 3L O2 86 HR 148/90 BP 214 CBG 102.4 Temp 20 Resp Rate

## 2019-02-21 NOTE — ED Notes (Signed)
MD aware of a positive covid result.

## 2019-02-21 NOTE — ED Triage Notes (Addendum)
Pt c/o nausea, fever, aches and a recent covid exposure. Did test neg this past week. Pt presents for a covid test and nausea. Pt arrived via [facility]. They report pt had O2 sats of 93% on RA. Pt was given 4mg  of Zofran IV. On arrival pt's Sats were 93% on RA. Pt also c/o "hiccups" that last hours at a time since Wednesday.

## 2019-02-21 NOTE — ED Provider Notes (Addendum)
[facility] DEPT Provider Note   CSN: [id number] Arrival date & time: 02/21/19  1737     History Chief Complaint  Patient presents with  . Nausea  . Emesis  . Covid Positive  . Diarrhea  . Hiccups    Jose Higgins is a 79 y.o. male.  79 year old male who was diagnosed with Covid earlier this morning presents due to persistent vomiting.  Was found to have mild hyponatremia and dehydration he was seen earlier this morning and was offered admission but deferred.  When he went home he continued to develop weakness as well as emesis uncontrolled with his home medications.  Denies any new dyspnea or chest pain.  Has not had any diarrhea.  Has been febrile.  Called EMS and was given 4 mg of Zofran and transported here.  He also was found to have a pulse ox of 94% on 3 L oxygen.        Past Medical History:  Diagnosis Date  . Diabetes mellitus without complication ([place])   . Hypertension     There are no problems to display for this patient.   History reviewed. No pertinent surgical history.     History reviewed. No pertinent family history.  Social History   Tobacco Use  . Smoking status: Never Smoker  . Smokeless tobacco: Never Used  Substance Use Topics  . Alcohol use: Never  . Drug use: Never    Home Medications Prior to Admission medications   Medication Sig Start Date End Date Taking? Authorizing Provider  alfuzosin (UROXATRAL) 10 MG 24 hr tablet Take 10 mg by mouth daily with breakfast.    [provider]  atorvastatin (LIPITOR) 20 MG tablet Take 20 mg by mouth daily.    [provider]  cetirizine (ZYRTEC) 10 MG tablet Take 10 mg by mouth daily.    [provider]  chlorproMAZINE (THORAZINE) 25 MG tablet Take 1 tablet (25 mg total) by mouth 3 (three) times daily as needed for hiccoughs. 02/21/19   Horton, Barbette Hair, MD  metoprolol tartrate (LOPRESSOR) 50 MG tablet Take 50 mg by mouth 2 (two) times  daily.    [provider]  ondansetron (ZOFRAN ODT) 4 MG disintegrating tablet Take 1 tablet (4 mg total) by mouth every 8 (eight) hours as needed for nausea or vomiting. 02/21/19   Horton, Barbette Hair, MD  sitaGLIPtin-metformin (JANUMET) 50-1000 MG tablet Take 1 tablet by mouth 2 (two) times daily with a meal.    [provider]    Allergies    Patient has no known allergies.  Review of Systems   Review of Systems  All other systems reviewed and are negative.   Physical Exam Updated Vital Signs BP (!) 135/53 (BP Location: Right Arm)   Pulse 71   Temp (!) 102.6 F (39.2 C) (Oral)   Resp (!) 25   Ht 1.778 m (5\' 10" )   Wt 87.1 kg   SpO2 94%   BMI 27.55 kg/m   Physical Exam Vitals and nursing note reviewed.  Constitutional:      General: He is not in acute distress.    Appearance: Normal appearance. He is well-developed. He is not toxic-appearing.  HENT:     Head: Normocephalic and atraumatic.  Eyes:     General: Lids are normal.     Conjunctiva/sclera: Conjunctivae normal.     Pupils: Pupils are equal, round, and reactive to light.  Neck:     Thyroid: No  thyroid mass.     Trachea: No tracheal deviation.  Cardiovascular:     Rate and Rhythm: Normal rate and regular rhythm.     Heart sounds: Normal heart sounds. No murmur. No gallop.   Pulmonary:     Effort: Pulmonary effort is normal. No respiratory distress.     Breath sounds: Normal breath sounds. No stridor. No decreased breath sounds, wheezing, rhonchi or rales.  Abdominal:     General: Bowel sounds are normal. There is no distension.     Palpations: Abdomen is soft.     Tenderness: There is no abdominal tenderness. There is no rebound.  Musculoskeletal:        General: No tenderness. Normal range of motion.     Cervical back: Normal range of motion and neck supple.  Skin:    General: Skin is warm and dry.     Findings: No abrasion or rash.  Neurological:     Mental Status: He is alert and  oriented to person, place, and time.     GCS: GCS eye subscore is 4. GCS verbal subscore is 5. GCS motor subscore is 6.     Cranial Nerves: No cranial nerve deficit.     Sensory: No sensory deficit.  Psychiatric:        Speech: Speech normal.        Behavior: Behavior normal.     ED Results / Procedures / Treatments   Labs (all labs ordered are listed, but only abnormal results are displayed) Labs Reviewed - No data to display  EKG None  Radiology DG Chest [facility] 1 View  Result Date: 02/21/2019 CLINICAL DATA:  Fever and recent COVID exposure EXAM: PORTABLE CHEST 1 VIEW COMPARISON:  None. FINDINGS: The heart size and mediastinal contours are within normal limits. Both lungs are clear. The visualized skeletal structures are unremarkable. IMPRESSION: No active disease. Electronically Signed   By: Ulyses Jarred M.D.   On: 02/21/2019 01:53    Procedures Procedures (including critical care time)  Medications Ordered in ED Medications - No data to display  ED Course  I have reviewed the triage vital signs and the nursing notes.  Pertinent labs & imaging results that were available during my care of the patient were reviewed by me and considered in my medical decision making (see chart for details).    MDM Rules/Calculators/A&P                      Patient had recent laboratory studies done and will likely not repeat them.  Will recheck x-ray of the chest.  Denies any new shortness of breath or chest pain.  Will IV hydrate here and admit to the hospital Final Clinical Impression(s) / ED Diagnoses Final diagnoses:  None    Rx / DC Orders ED Discharge Orders    None       Lacretia Leigh, MD 02/21/19 Tresa Moore    Lacretia Leigh, MD 02/21/19 1910

## 2019-02-21 NOTE — ED Notes (Addendum)
Discharged by previous shift.  Stayed in the room to wait for his wife to pick him up.

## 2019-02-21 NOTE — ED Notes (Signed)
Labs, RN @bedside .  Will call when patient can be imaged.

## 2019-02-22 ENCOUNTER — Inpatient Hospital Stay (HOSPITAL_COMMUNITY): Payer: PPO

## 2019-02-22 DIAGNOSIS — J9601 Acute respiratory failure with hypoxia: Secondary | ICD-10-CM

## 2019-02-22 DIAGNOSIS — I10 Essential (primary) hypertension: Secondary | ICD-10-CM

## 2019-02-22 DIAGNOSIS — R270 Ataxia, unspecified: Secondary | ICD-10-CM

## 2019-02-22 DIAGNOSIS — E119 Type 2 diabetes mellitus without complications: Secondary | ICD-10-CM

## 2019-02-22 LAB — URINALYSIS, ROUTINE W REFLEX MICROSCOPIC
Bilirubin Urine: NEGATIVE
Glucose, UA: >=500 mg/dL — AB
Ketones, ur: 80 mg/dL — AB
Leukocytes,Ua: NEGATIVE
Nitrite: NEGATIVE
Protein, ur: 100 mg/dL — AB
Specific Gravity, Urine: 1.023 (ref 1.005–1.030)
pH: 5 (ref 5.0–8.0)

## 2019-02-22 LAB — MAGNESIUM: Magnesium: 1.6 mg/dL — ABNORMAL LOW (ref 1.7–2.4)

## 2019-02-22 LAB — COMPREHENSIVE METABOLIC PANEL
ALT: 24 U/L (ref 0–44)
AST: 34 U/L (ref 15–41)
Albumin: 2.8 g/dL — ABNORMAL LOW (ref 3.5–5.0)
Alkaline Phosphatase: 32 U/L — ABNORMAL LOW (ref 38–126)
Anion gap: 11 (ref 5–15)
BUN: 13 mg/dL (ref 8–23)
CO2: 22 mmol/L (ref 22–32)
Calcium: 7.3 mg/dL — ABNORMAL LOW (ref 8.9–10.3)
Chloride: 97 mmol/L — ABNORMAL LOW (ref 98–111)
Creatinine, Ser: 1.04 mg/dL (ref 0.61–1.24)
GFR calc Af Amer: >60 mL/min (ref >60)
GFR calc non Af Amer: >60 mL/min (ref >60)
Glucose, Bld: 200 mg/dL — ABNORMAL HIGH (ref 70–99)
Potassium: 3.5 mmol/L (ref 3.5–5.1)
Sodium: 130 mmol/L — ABNORMAL LOW (ref 135–145)
Total Bilirubin: 1.3 mg/dL — ABNORMAL HIGH (ref 0.3–1.2)
Total Protein: 6.2 g/dL — ABNORMAL LOW (ref 6.5–8.1)

## 2019-02-22 LAB — HEMOGLOBIN A1C
Hgb A1c MFr Bld: 7.1 % — ABNORMAL HIGH (ref 4.8–5.6)
Mean Plasma Glucose: 157.07 mg/dL

## 2019-02-22 LAB — CBC WITH DIFFERENTIAL/PLATELET
Abs Immature Granulocytes: 0.06 10*3/uL (ref 0.00–0.07)
Basophils Absolute: 0 10*3/uL (ref 0.0–0.1)
Basophils Relative: 0 %
Eosinophils Absolute: 0 10*3/uL (ref 0.0–0.5)
Eosinophils Relative: 0 %
HCT: 34.1 % — ABNORMAL LOW (ref 39.0–52.0)
Hemoglobin: 11.8 g/dL — ABNORMAL LOW (ref 13.0–17.0)
Immature Granulocytes: 1 %
Lymphocytes Relative: 7 %
Lymphs Abs: 0.4 10*3/uL — ABNORMAL LOW (ref 0.7–4.0)
MCH: 31.1 pg (ref 26.0–34.0)
MCHC: 34.6 g/dL (ref 30.0–36.0)
MCV: 89.7 fL (ref 80.0–100.0)
Monocytes Absolute: 0.2 10*3/uL (ref 0.1–1.0)
Monocytes Relative: 3 %
Neutro Abs: 4.7 10*3/uL (ref 1.7–7.7)
Neutrophils Relative %: 89 %
Platelets: 121 10*3/uL — ABNORMAL LOW (ref 150–400)
RBC: 3.8 MIL/uL — ABNORMAL LOW (ref 4.22–5.81)
RDW: 11.8 % (ref 11.5–15.5)
WBC: 5.3 10*3/uL (ref 4.0–10.5)
nRBC: 0 % (ref 0.0–0.2)

## 2019-02-22 LAB — BRAIN NATRIURETIC PEPTIDE: B Natriuretic Peptide: 355.9 pg/mL — ABNORMAL HIGH (ref 0.0–100.0)

## 2019-02-22 LAB — GLUCOSE, CAPILLARY
Glucose-Capillary: 201 mg/dL — ABNORMAL HIGH (ref 70–99)
Glucose-Capillary: 162 mg/dL — ABNORMAL HIGH (ref 70–99)
Glucose-Capillary: 204 mg/dL — ABNORMAL HIGH (ref 70–99)
Glucose-Capillary: 223 mg/dL — ABNORMAL HIGH (ref 70–99)

## 2019-02-22 LAB — NA AND K (SODIUM & POTASSIUM), RAND UR
Potassium Urine: 66 mmol/L
Sodium, Ur: 41 mmol/L

## 2019-02-22 LAB — D-DIMER, QUANTITATIVE: D-Dimer, Quant: 2.1 ug/mL-FEU — ABNORMAL HIGH (ref 0.00–0.50)

## 2019-02-22 LAB — PHOSPHORUS: Phosphorus: 1.4 mg/dL — ABNORMAL LOW (ref 2.5–4.6)

## 2019-02-22 LAB — OSMOLALITY, URINE: Osmolality, Ur: 772 mOsm/kg (ref 300–900)

## 2019-02-22 LAB — OSMOLALITY: Osmolality: 277 mOsm/kg (ref 275–295)

## 2019-02-22 MED ORDER — METOPROLOL TARTRATE 50 MG PO TABS
50.0000 mg | ORAL_TABLET | Freq: Two times a day (BID) | ORAL | Status: DC
  Administered 2019-02-22 – 2019-02-23 (×4): 50 mg via ORAL
  Filled 2019-02-22 (×5): qty 1

## 2019-02-22 MED ORDER — ASCORBIC ACID 500 MG PO TABS
500.0000 mg | ORAL_TABLET | Freq: Every day | ORAL | Status: DC
  Administered 2019-02-22 – 2019-02-25 (×4): 500 mg via ORAL
  Filled 2019-02-22 (×4): qty 1

## 2019-02-22 MED ORDER — BACLOFEN 10 MG PO TABS
5.0000 mg | ORAL_TABLET | Freq: Three times a day (TID) | ORAL | Status: DC | PRN
  Administered 2019-02-22 – 2019-02-24 (×8): 5 mg via ORAL
  Filled 2019-02-22 (×11): qty 1

## 2019-02-22 MED ORDER — INSULIN ASPART 100 UNIT/ML ~~LOC~~ SOLN
0.0000 [IU] | Freq: Three times a day (TID) | SUBCUTANEOUS | Status: DC
  Administered 2019-02-22: 08:00:00 5 [IU] via SUBCUTANEOUS
  Administered 2019-02-22: 11:00:00 3 [IU] via SUBCUTANEOUS
  Administered 2019-02-22 – 2019-02-23 (×2): 5 [IU] via SUBCUTANEOUS
  Administered 2019-02-23: 18:00:00 8 [IU] via SUBCUTANEOUS
  Administered 2019-02-23: 5 [IU] via SUBCUTANEOUS
  Administered 2019-02-24: 17:00:00 3 [IU] via SUBCUTANEOUS
  Administered 2019-02-24: 13:00:00 5 [IU] via SUBCUTANEOUS
  Administered 2019-02-25 (×2): 3 [IU] via SUBCUTANEOUS

## 2019-02-22 MED ORDER — ALFUZOSIN HCL ER 10 MG PO TB24
10.0000 mg | ORAL_TABLET | Freq: Every day | ORAL | Status: DC
  Administered 2019-02-22 – 2019-02-24 (×3): 10 mg via ORAL
  Filled 2019-02-22 (×6): qty 1

## 2019-02-22 MED ORDER — POTASSIUM CHLORIDE 20 MEQ/15ML (10%) PO SOLN
40.0000 meq | Freq: Once | ORAL | Status: AC
  Administered 2019-02-22: 09:00:00 40 meq via ORAL
  Filled 2019-02-22: qty 30

## 2019-02-22 MED ORDER — ZINC SULFATE 220 (50 ZN) MG PO CAPS
220.0000 mg | ORAL_CAPSULE | Freq: Every day | ORAL | Status: DC
  Administered 2019-02-22 – 2019-02-25 (×4): 220 mg via ORAL
  Filled 2019-02-22 (×4): qty 1

## 2019-02-22 MED ORDER — ATORVASTATIN CALCIUM 10 MG PO TABS
20.0000 mg | ORAL_TABLET | Freq: Every day | ORAL | Status: DC
  Administered 2019-02-22 – 2019-02-24 (×3): 20 mg via ORAL
  Filled 2019-02-22 (×5): qty 2

## 2019-02-22 MED ORDER — MAGNESIUM SULFATE 2 GM/50ML IV SOLN
2.0000 g | Freq: Once | INTRAVENOUS | Status: AC
  Administered 2019-02-22: 10:00:00 2 g via INTRAVENOUS
  Filled 2019-02-22: qty 50

## 2019-02-22 MED ORDER — SODIUM CHLORIDE 0.9 % IV SOLN: INTRAVENOUS | Status: AC

## 2019-02-22 MED ORDER — SODIUM PHOSPHATES 45 MMOLE/15ML IV SOLN
15.0000 mmol | Freq: Once | INTRAVENOUS | Status: AC
  Administered 2019-02-22: 11:00:00 15 mmol via INTRAVENOUS
  Filled 2019-02-22: qty 5

## 2019-02-22 MED ORDER — ONDANSETRON HCL 4 MG PO TABS: 4.0000 mg | ORAL_TABLET | Freq: Four times a day (QID) | ORAL | Status: DC | PRN

## 2019-02-22 MED ORDER — HYDROCOD POLST-CPM POLST ER 10-8 MG/5ML PO SUER: 5.0000 mL | Freq: Two times a day (BID) | ORAL | Status: DC | PRN

## 2019-02-22 MED ORDER — SENNA 8.6 MG PO TABS
1.0000 | ORAL_TABLET | Freq: Two times a day (BID) | ORAL | Status: DC
  Administered 2019-02-22 – 2019-02-24 (×2): 8.6 mg via ORAL
  Filled 2019-02-22 (×5): qty 1

## 2019-02-22 MED ORDER — GUAIFENESIN-DM 100-10 MG/5ML PO SYRP
10.0000 mL | ORAL_SOLUTION | ORAL | Status: DC | PRN
  Filled 2019-02-22: qty 10

## 2019-02-22 MED ORDER — ONDANSETRON HCL 4 MG/2ML IJ SOLN: 4.0000 mg | Freq: Four times a day (QID) | INTRAMUSCULAR | Status: DC | PRN

## 2019-02-22 MED ORDER — DEXAMETHASONE 6 MG PO TABS
6.0000 mg | ORAL_TABLET | ORAL | Status: DC
  Administered 2019-02-22 – 2019-02-25 (×3): 6 mg via ORAL
  Filled 2019-02-22 (×3): qty 1

## 2019-02-22 MED ORDER — ALBUTEROL SULFATE HFA 108 (90 BASE) MCG/ACT IN AERS
2.0000 | INHALATION_SPRAY | Freq: Four times a day (QID) | RESPIRATORY_TRACT | Status: DC
  Administered 2019-02-22 – 2019-02-23 (×7): 2 via RESPIRATORY_TRACT
  Filled 2019-02-22 (×2): qty 6.7

## 2019-02-22 MED ORDER — POLYETHYLENE GLYCOL 3350 17 G PO PACK: 17.0000 g | PACK | Freq: Every day | ORAL | Status: DC | PRN

## 2019-02-22 MED ORDER — ENSURE ENLIVE PO LIQD
237.0000 mL | Freq: Three times a day (TID) | ORAL | Status: DC
  Administered 2019-02-22 – 2019-02-24 (×5): 237 mL via ORAL

## 2019-02-22 MED ORDER — DOCUSATE SODIUM 100 MG PO CAPS
100.0000 mg | ORAL_CAPSULE | Freq: Two times a day (BID) | ORAL | Status: DC
  Administered 2019-02-22 – 2019-02-24 (×2): 100 mg via ORAL
  Filled 2019-02-22 (×5): qty 1

## 2019-02-22 MED ORDER — INSULIN ASPART 100 UNIT/ML ~~LOC~~ SOLN
0.0000 [IU] | Freq: Every day | SUBCUTANEOUS | Status: DC
  Administered 2019-02-22: 21:00:00 2 [IU] via SUBCUTANEOUS
  Administered 2019-02-23: 23:00:00 3 [IU] via SUBCUTANEOUS

## 2019-02-22 MED ORDER — ACETAMINOPHEN 325 MG PO TABS: 650.0000 mg | ORAL_TABLET | Freq: Four times a day (QID) | ORAL | Status: DC | PRN

## 2019-02-22 NOTE — ED Notes (Signed)
[facility] notified of need for transport of pt to [facility].

## 2019-02-22 NOTE — Progress Notes (Signed)
PROGRESS NOTE  Jose Higgins [id number] DOB: [birth year] DOA: 02/21/2019 PCP: System, Provider Not In   LOS: 1 day   Brief Narrative / Interim history: 78 year old male with history of HTN, DM 2, who tested positive for Covid on 1/25 and initially seen in the ED and discharged home, then returned later that day because he was felt weak.  He reports 3 to 4 days of fever, myalgia, productive cough with yellow sputum, dyspnea, nausea and vomiting.  He is also been having poor oral intake over the last several day.  He was hypoxic requiring 2 L nasal cannula in the ED, and chest x-ray was concerning for viral pneumonia.  Patient also reports significant ataxia for the last 3 to 4 days  Subjective / 24h Interval events: -Complains of a persistent hiccup this morning.  Still weak, reports significant ataxia leaning towards the right side.  Breathing little bit better than yesterday.  No chest pain, no abdominal pain.  Reported diarrhea with one episode couple of days ago but none since.  Nausea and vomiting at home, currently very poor appetite and does not feel like eating  Assessment & Plan:  Principal Problem Acute Hypoxic Respiratory Failure due to Covid-19 Viral Illness -Patient was started on remdesivir scheduled to be done on 1/29 -He was also started on steroids with Decadron -Continue supportive treatment with antitussives, inhalers, incentive spirometry, flutter valve -Continue to monitor inflammatory markers    COVID-19 Labs  Recent Labs    02/21/19 0109 02/22/19 0538  DDIMER 1.10* 2.10*  FERRITIN 196  --   LDH 170  --   CRP 10.1*  --     No results found for: SARSCOV2NAA  Active Problems Essential hypertension -Blood pressure stable, continue metoprolol, hold home irbesartan  Ataxia, dizziness -Patient at baseline quite active, goes to the gym couple days a week and golfs 2 to 3 days a week, independent at baseline.  Now he has difficulties walking, he feels  generally weak but leaning towards the right side. -Discussed with PT,?  Neuro event.  Will obtain a CT scan of the head, monitor progress, if negative will need an MRI  DM 2 -Continue sliding scale insulin  CBG (last 3)  Recent Labs    02/22/19 0730 02/22/19 1116  GLUCAP 201* 162*    Hyperlipidemia -Continue atorvastatin  Hyponatremia -Likely due to dehydration, monitor sodium, improving with IV fluids  Scheduled Meds: . albuterol  2 puff Inhalation Q6H  . alfuzosin  10 mg Oral Q breakfast  . vitamin C  500 mg Oral Daily  . atorvastatin  20 mg Oral Daily  . dexamethasone  6 mg Oral Q24H  . docusate sodium  100 mg Oral BID  . enoxaparin (LOVENOX) injection  40 mg Subcutaneous Q24H  . insulin aspart  0-15 Units Subcutaneous TID WC  . insulin aspart  0-5 Units Subcutaneous QHS  . metoprolol tartrate  50 mg Oral BID  . senna  1 tablet Oral BID  . zinc sulfate  220 mg Oral Daily   Continuous Infusions: . sodium chloride Stopped (02/22/19 0540)  . remdesivir 100 mg in NS 100 mL     PRN Meds:.acetaminophen, chlorpheniramine-HYDROcodone, guaiFENesin-dextromethorphan, ondansetron **OR** ondansetron (ZOFRAN) IV, polyethylene glycol  DVT prophylaxis: Lovenox Code Status: Full code Family Communication: Discussed with patient, patient declined for me to call family and stated that he will update them himself Patient admitted from: Home Anticipated d/c place: Home Barriers to d/c: Hypoxia, IV remdesivir  Consultants:  None  Procedures:  None   Microbiology: None   Antimicrobials: None    Objective: Vitals:   02/22/19 0100 02/22/19 0135 02/22/19 0200 02/22/19 0300  BP: 128/66 (!) 142/65 (!) 120/59 (!) 162/80  Pulse: 74 84 79 82  Resp: (!) 23 13 (!) 23 (!) 25  Temp:    98.3 F (36.8 C)  TempSrc:    Oral  SpO2: 100% 98% 97% 96%  Weight:      Height:        Intake/Output Summary (Last 24 hours) at 02/22/2019 0651 Last data filed at 02/22/2019 0600 Gross per 24  hour  Intake 243.92 ml  Output --  Net 243.92 ml   Filed Weights   02/21/19 1800 02/21/19 1810  Weight: 87.1 kg 87.1 kg    Examination:  Constitutional: Appears somewhat uncomfortable, persistent hiccups when I am in the room Eyes: no scleral icterus ENMT: Mucous membranes are dry.  Neck: normal, supple Respiratory: Diminished at the bases, faint rhonchi, no wheezing or crackles Cardiovascular: Regular rate and rhythm, no murmurs / rubs / gallops. No LE edema. Abdomen: non distended, no tenderness. Bowel sounds positive.  Musculoskeletal: no clubbing / cyanosis.  Skin: no rashes Neurologic: No focal deficits noted Psychiatric: Normal judgment and insight. Alert and oriented x 3. Normal mood.    Data Reviewed: I have independently reviewed following labs and imaging studies  CBC: Recent Labs  Lab 02/21/19 0109  WBC 5.9  NEUTROABS 4.8  HGB 12.4*  HCT 35.7*  MCV 91.1  PLT 242*   Basic Metabolic Panel: Recent Labs  Lab 02/21/19 0109 02/21/19 1957  NA 127* 128*  K 3.9 3.6  CL 95* 96*  CO2 23 22  GLUCOSE 212* 205*  BUN 12 15  CREATININE 0.95 1.06  CALCIUM 8.3* 7.9*   GFR: Estimated Creatinine Clearance: 58.3 mL/min (by C-G formula based on SCr of 1.06 mg/dL). Liver Function Tests: Recent Labs  Lab 02/21/19 0109  AST 25  ALT 21  ALKPHOS 38  BILITOT 2.0*  PROT 6.5  ALBUMIN 3.2*   No results for input(s): LIPASE, AMYLASE in the last 168 hours. No results for input(s): AMMONIA in the last 168 hours. Coagulation Profile: No results for input(s): INR, PROTIME in the last 168 hours. Cardiac Enzymes: No results for input(s): CKTOTAL, CKMB, CKMBINDEX, TROPONINI in the last 168 hours. BNP (last 3 results) No results for input(s): PROBNP in the last 8760 hours. HbA1C: No results for input(s): HGBA1C in the last 72 hours. CBG: No results for input(s): GLUCAP in the last 168 hours. Lipid Profile: Recent Labs    02/21/19 0109  TRIG 65   Thyroid Function  Tests: No results for input(s): TSH, T4TOTAL, FREET4, T3FREE, THYROIDAB in the last 72 hours. Anemia Panel: Recent Labs    02/21/19 0109  FERRITIN 196   Urine analysis: No results found for: COLORURINE, APPEARANCEUR, LABSPEC, PHURINE, GLUCOSEU, HGBUR, BILIRUBINUR, KETONESUR, PROTEINUR, UROBILINOGEN, NITRITE, LEUKOCYTESUR Sepsis Labs: Invalid input(s): PROCALCITONIN, LACTICIDVEN  Recent Results (from the past 240 hour(s))  SARS Coronavirus 2 Ag (30 min TAT) - Nasal Swab (BD Veritor Kit)     Status: Abnormal   Collection Time: 02/21/19  1:09 AM   Specimen: Nasal Swab (BD Veritor Kit)  Result Value Ref Range Status   SARS Coronavirus 2 Ag POSITIVE (A) NEGATIVE Final    Comment: RESULT CALLED TO, READ BACK BY AND VERIFIED WITH: MAYNARD,C,RN @ 0153 02/21/19 BY GWYN,P (NOTE) SARS-CoV-2 antigen PRESENT. Positive results indicate the presence of viral  antigens, but clinical correlation with patient history and other diagnostic information is necessary to determine patient infection status.  Positive results do not rule out bacterial infection or co-infection  with other viruses. False positive results are rare but can occur, and confirmatory RT-PCR testing may be appropriate in some circumstances. The expected result is Negative. Fact Sheet for Patients: PodPark.tn Fact Sheet for Providers: GiftContent.is  This test is not yet approved or cleared by the [place] FDA and  has been authorized for detection and/or diagnosis of SARS-CoV-2 by FDA under an Emergency Use Authorization (EUA).  This EUA will remain in effect (meaning this test can be used) for the duration of  the COVID-19 decla ration under Section 564(b)(1) of the Act, 21 U.S.C. section 360bbb-3(b)(1), unless the authorization is terminated or revoked sooner. Performed at [facility], [address] [place], [place] [address]       Radiology  Studies: DG Chest [facility] 1 View  Result Date: 02/21/2019 CLINICAL DATA:  Shortness of breath EXAM: PORTABLE CHEST 1 VIEW COMPARISON:  02/21/2019 FINDINGS: Patchy airspace opacity in the left mid to lower lung. No pleural effusion. Normal heart size. No pneumothorax. IMPRESSION: Vague patchy opacity in the left mid to lower lung may reflect mild pneumonia Electronically Signed   By: Donavan Foil M.D.   On: 02/21/2019 18:40   DG Chest Port 1 View  Result Date: 02/21/2019 CLINICAL DATA:  Fever and recent COVID exposure EXAM: PORTABLE CHEST 1 VIEW COMPARISON:  None. FINDINGS: The heart size and mediastinal contours are within normal limits. Both lungs are clear. The visualized skeletal structures are unremarkable. IMPRESSION: No active disease. Electronically Signed   By: Ulyses Jarred M.D.   On: 02/21/2019 01:53    Marzetta Board, MD, PhD [facility]  Between 7 am - 7 pm I am available, please contact me via Amion or Securechat  Between 7 pm - 7 am I am not available, please contact night coverage MD/APP via Amion

## 2019-02-22 NOTE — Evaluation (Addendum)
Physical Therapy Evaluation Patient Details Name: Jose Higgins MRN: [id number] DOB: August 17, 1940 Today's Date: 02/22/2019   History of Present Illness  79 y.o. male admitted on 02/21/19 with N/V, myalgias, cough, SOB.  Pt dx with acute hypoxic respiratory failure due to COVID 19 viral illness.  Pt also with sudden, significant imbalance and ataxia.  CT scan negative for acute stroke.  MRI is pending.  Pt with other significant PMH of HTN, DM.    Clinical Impression  Up until his hospitalization, pt reports a very active lifestyle of working out 3 days a week, walking 2 mi a day and playing golf on the other days.   He was completely independent, living with his wife in [place] in an independent living scenario.  He had a sudden change where he was dizzy, N/V and dx with COVID.  He has significant balance deficit now with midlly ataxic gait pattern, consistently tipping to the right. He needs assist for balance even using a RW to ensure he does not fall.  I messaged Dr. Cruzita Lederer and he is ordering a CT and possibly an MRI to r/o stroke.  Pt will likely benefit from post acute rehab at his place of residence [facility]) as long as they take [place] patients.   PT to follow acutely for deficits listed below.      Follow Up Recommendations SNF;Other (comment)(SNF rehab at [facility]. )    Equipment Recommendations  Rolling walker with 5" wheels;3in1 (PT);Other (comment)(shower chair)    Recommendations for Other Services   NA    Precautions / Restrictions Precautions Precautions: Fall Precaution Comments: Pt consistently tipping to the right in standing and with giat.       Mobility  Bed Mobility               General bed mobility comments: Pt was OOB in the recliner chair.   Transfers Overall transfer level: Needs assistance Equipment used: Rolling walker (2 wheeled);None Transfers: Sit to/from Stand Sit to Stand: Min assist;Mod assist         General transfer  comment: Mod assist without assistive device, pt leaning right and falling backwards into the chair. Tried again with RW cues for safe hand placement and he required less assistance, however, I could not take my hands off of him even with the RW due to poor balance and spatial awareness.   Ambulation/Gait Ambulation/Gait assistance: Mod assist;Min assist Gait Distance (Feet): 25 Feet(x3) Assistive device: Rolling walker (2 wheeled);1 person hand held assist Gait Pattern/deviations: Step-through pattern;Ataxic;Staggering right Gait velocity: decreased Gait velocity interpretation: 1.31 - 2.62 ft/sec, indicative of limited community ambulator General Gait Details: Pt with mildly ataxic gait pattern, consistently staggering to the right. Mod assist without a device and min assist with RW.  I could not take hands off of him even with RW due to balance deficits.       Modified Rankin (Stroke Patients Only) Modified Rankin (Stroke Patients Only) Pre-Morbid Rankin Score: No symptoms Modified Rankin: Moderately severe disability     Balance Overall balance assessment: Needs assistance Sitting-balance support: Feet supported;No upper extremity supported Sitting balance-Leahy Scale: Good   Postural control: Right lateral lean Standing balance support: Bilateral upper extremity supported Standing balance-Leahy Scale: Poor Standing balance comment: Needs external support in standing from AD and therapist.                              Pertinent Vitals/Pain Pain  Assessment: No/denies pain    Home Living Family/patient expects to be discharged to:: Private residence Living Arrangements: Spouse/significant other Available Help at Discharge: Family Type of Home: Independent living facility([facility]) Home Access: Level entry     Home Layout: One level Home Equipment: None      Prior Function Level of Independence: Needs assistance   Gait / Transfers Assistance Needed:  Pt was independent PTA.  Worked out at [employer] three days a week, walked 2 miles, on the other days he was playing golf.            Hand Dominance   Dominant Hand: Right    Extremity/Trunk Assessment   Upper Extremity Assessment Upper Extremity Assessment: Defer to OT evaluation    Lower Extremity Assessment Lower Extremity Assessment: RLE deficits/detail;LLE deficits/detail RLE Deficits / Details: bil LE strenght at least 4/5 and symmetrical bil.  Sensation grossly intact and heel to shin test normal.  Ataixc on his feet.  LLE Deficits / Details: bil LE strenght at least 4/5 and symmetrical bil.  Sensation grossly intact and heel to shin test normal.  Ataixc on his feet.     Cervical / Trunk Assessment Cervical / Trunk Assessment: Other exceptions Cervical / Trunk Exceptions: in static standing R lateral lean preference  Communication   Communication: (having constant hiccups, difficult to speak with hiccups)  Cognition Arousal/Alertness: Awake/alert Behavior During Therapy: Restless;Impulsive Overall Cognitive Status: Impaired/Different from baseline Area of Impairment: Following commands;Safety/judgement;Awareness;Problem solving                       Following Commands: Follows one step commands consistently;Follows one step commands with increased time Safety/Judgement: Decreased awareness of safety;Decreased awareness of deficits Awareness: Intellectual Problem Solving: Difficulty sequencing;Requires verbal cues;Requires tactile cues;Slow processing;Decreased initiation General Comments: Pt A and O x 4, however, he continues to get up unassisted showing poor awareness of his deficits.  He has difficulty sequencing more complex commands, and at times seems to have difficulty with his words (he also has constant hiccups which doesn't help).        General Comments General comments (skin integrity, edema, etc.): VSS on RA during gait.  O2 sats in the low to mid  90s.         Assessment/Plan    PT Assessment Patient needs continued PT services  PT Problem List Decreased strength;Decreased activity tolerance;Decreased balance;Decreased mobility;Decreased coordination;Decreased cognition;Decreased knowledge of use of DME;Decreased safety awareness;Decreased knowledge of precautions       PT Treatment Interventions DME instruction;Gait training;Functional mobility training;Therapeutic activities;Therapeutic exercise;Balance training;Neuromuscular re-education;Cognitive remediation;Patient/family education    PT Goals (Current goals can be found in the Care Plan section)  Acute Rehab PT Goals Patient Stated Goal: to go home, get back to normal.  PT Goal Formulation: With patient Time For Goal Achievement: 03/08/19 Potential to Achieve Goals: Good    Frequency Min 4X/week           AM-PAC PT "6 Clicks" Mobility  Outcome Measure Help needed turning from your back to your side while in a flat bed without using bedrails?: A Little Help needed moving from lying on your back to sitting on the side of a flat bed without using bedrails?: A Little Help needed moving to and from a bed to a chair (including a wheelchair)?: A Lot Help needed standing up from a chair using your arms (e.g., wheelchair or bedside chair)?: A Lot Help needed to walk in hospital room?: A Lot  Help needed climbing 3-5 steps with a railing? : A Lot 6 Click Score: 14    End of Session Equipment Utilized During Treatment: Gait belt Activity Tolerance: Patient tolerated treatment well Patient left: in chair;with call bell/phone within reach Nurse Communication: Mobility status PT Visit Diagnosis: Muscle weakness (generalized) (M62.81);Difficulty in walking, not elsewhere classified (R26.2);Other symptoms and signs involving the nervous system [id number])    Time: [id number] PT Time Calculation (min) (ACUTE ONLY): 43 min   Charges:         Verdene Lennert, PT, DPT  Acute  Rehabilitation [phone] pager #[phone] office  @ [facility]: [phone]   PT Evaluation $PT Eval Moderate Complexity: 1 Mod PT Treatments $Gait Training: 23-37 mins       Barbarann Ehlers Sher Hellinger 02/22/2019, 3:15 PM

## 2019-02-22 NOTE — Progress Notes (Signed)
Pt continues to get up unassisted. Chair alarm in place, Lap belt now in place. Pt is a&o x4, pt does struggle to get words out. Pt reports "I know I am suppose to call I just dont want to". This RN educated pt on safety measures with getting up unassisted. Pt verbalizes understanding. Pt getting prepared for CT now, then wishes to stay in he bed and rest.

## 2019-02-22 NOTE — Progress Notes (Signed)
Wife called she is updated, all questions answered.

## 2019-02-22 NOTE — Progress Notes (Signed)
Initial Nutrition Assessment  DOCUMENTATION CODES:   Not applicable  INTERVENTION:   Ensure Enlive po TID, each supplement provides 350 kcal and 20 grams of protein  Pt receiving Hormel Shake daily with Breakfast which provides 520 kcals and 22 g of protein and Magic cup BID with lunch and dinner, each supplement provides 290 kcal and 9 grams of protein, automatically on meal trays to optimize nutritional intake.    NUTRITION DIAGNOSIS:   Increased nutrient needs related to acute illness(COVID-19) as evidenced by estimated needs.  GOAL:   Patient will meet greater than or equal to 90% of their needs  MONITOR:   PO intake, Supplement acceptance  REASON FOR ASSESSMENT:   Malnutrition Screening Tool    ASSESSMENT:   Pt with PMH of HTN and DM who is very active (gym/golf each week) admitted for hypoxia d/t COVID-19 and ataxia.   CT pending to evaluate ataxia. PO intake reported poor 3-4 days PTA and remains poor.  Pt currently on room air.   Meal Completion: 0-25%  Medications reviewed and include: vitamin C, decadron, colace, SSI, senokot, zinc  Labs reviewed: Na 130 (L), PO4: 1.4 (L), Magnesium: 1.6 (L) CBG's: 201-162    NUTRITION - FOCUSED PHYSICAL EXAM:  Deferred   Diet Order:   Diet Order            Diet Carb Modified Fluid consistency: Thin; Room service appropriate? Yes  Diet effective now              EDUCATION NEEDS:   No education needs have been identified at this time  Skin:     Last BM:  1/26  Height:   Ht Readings from Last 1 Encounters:  02/21/19 5\' 10"  (1.778 m)    Weight:   Wt Readings from Last 1 Encounters:  02/21/19 87.1 kg    Ideal Body Weight:  75.4 kg  BMI:  Body mass index is 27.55 kg/m.  Estimated Nutritional Needs:   Kcal:  2150-2500  Protein:  105-130 grams  Fluid:  2 L/day  Maylon Peppers RD, LDN, CNSC [phone] Pager [phone] After Hours Pager

## 2019-02-23 ENCOUNTER — Inpatient Hospital Stay (HOSPITAL_COMMUNITY): Payer: PPO

## 2019-02-23 LAB — CBC WITH DIFFERENTIAL/PLATELET
Abs Immature Granulocytes: 0.04 10*3/uL (ref 0.00–0.07)
Basophils Absolute: 0 10*3/uL (ref 0.0–0.1)
Basophils Relative: 0 %
Eosinophils Absolute: 0 10*3/uL (ref 0.0–0.5)
Eosinophils Relative: 0 %
HCT: 33.1 % — ABNORMAL LOW (ref 39.0–52.0)
Hemoglobin: 11.5 g/dL — ABNORMAL LOW (ref 13.0–17.0)
Immature Granulocytes: 1 %
Lymphocytes Relative: 14 %
Lymphs Abs: 0.9 10*3/uL (ref 0.7–4.0)
MCH: 31.4 pg (ref 26.0–34.0)
MCHC: 34.7 g/dL (ref 30.0–36.0)
MCV: 90.4 fL (ref 80.0–100.0)
Monocytes Absolute: 0.4 10*3/uL (ref 0.1–1.0)
Monocytes Relative: 6 %
Neutro Abs: 5.2 10*3/uL (ref 1.7–7.7)
Neutrophils Relative %: 79 %
Platelets: 147 10*3/uL — ABNORMAL LOW (ref 150–400)
RBC: 3.66 MIL/uL — ABNORMAL LOW (ref 4.22–5.81)
RDW: 12.1 % (ref 11.5–15.5)
WBC: 6.6 10*3/uL (ref 4.0–10.5)
nRBC: 0 % (ref 0.0–0.2)

## 2019-02-23 LAB — COMPREHENSIVE METABOLIC PANEL
ALT: 25 U/L (ref 0–44)
AST: 40 U/L (ref 15–41)
Albumin: 2.6 g/dL — ABNORMAL LOW (ref 3.5–5.0)
Alkaline Phosphatase: 28 U/L — ABNORMAL LOW (ref 38–126)
Anion gap: 9 (ref 5–15)
BUN: 21 mg/dL (ref 8–23)
CO2: 23 mmol/L (ref 22–32)
Calcium: 7.3 mg/dL — ABNORMAL LOW (ref 8.9–10.3)
Chloride: 98 mmol/L (ref 98–111)
Creatinine, Ser: 1.09 mg/dL (ref 0.61–1.24)
GFR calc Af Amer: >60 mL/min (ref >60)
GFR calc non Af Amer: >60 mL/min (ref >60)
Glucose, Bld: 180 mg/dL — ABNORMAL HIGH (ref 70–99)
Potassium: 3.4 mmol/L — ABNORMAL LOW (ref 3.5–5.1)
Sodium: 130 mmol/L — ABNORMAL LOW (ref 135–145)
Total Bilirubin: 1.4 mg/dL — ABNORMAL HIGH (ref 0.3–1.2)
Total Protein: 5.7 g/dL — ABNORMAL LOW (ref 6.5–8.1)

## 2019-02-23 LAB — GLUCOSE, CAPILLARY
Glucose-Capillary: 215 mg/dL — ABNORMAL HIGH (ref 70–99)
Glucose-Capillary: 223 mg/dL — ABNORMAL HIGH (ref 70–99)
Glucose-Capillary: 268 mg/dL — ABNORMAL HIGH (ref 70–99)
Glucose-Capillary: 274 mg/dL — ABNORMAL HIGH (ref 70–99)

## 2019-02-23 LAB — PHOSPHORUS: Phosphorus: 2 mg/dL — ABNORMAL LOW (ref 2.5–4.6)

## 2019-02-23 LAB — MAGNESIUM: Magnesium: 2.1 mg/dL (ref 1.7–2.4)

## 2019-02-23 LAB — D-DIMER, QUANTITATIVE: D-Dimer, Quant: 1.92 ug/mL-FEU — ABNORMAL HIGH (ref 0.00–0.50)

## 2019-02-23 MED ORDER — METOPROLOL TARTRATE 25 MG PO TABS
25.0000 mg | ORAL_TABLET | Freq: Two times a day (BID) | ORAL | Status: DC
  Administered 2019-02-24: 23:00:00 25 mg via ORAL
  Filled 2019-02-23 (×4): qty 1

## 2019-02-23 MED ORDER — LOPERAMIDE HCL 2 MG PO CAPS
2.0000 mg | ORAL_CAPSULE | ORAL | Status: DC | PRN
  Administered 2019-02-23: 18:00:00 2 mg via ORAL
  Filled 2019-02-23: qty 1

## 2019-02-23 MED ORDER — LINAGLIPTIN 5 MG PO TABS
5.0000 mg | ORAL_TABLET | Freq: Every day | ORAL | Status: DC
  Administered 2019-02-25: 08:00:00 5 mg via ORAL
  Filled 2019-02-23 (×2): qty 1

## 2019-02-23 MED ORDER — POTASSIUM CHLORIDE CRYS ER 20 MEQ PO TBCR
40.0000 meq | EXTENDED_RELEASE_TABLET | Freq: Once | ORAL | Status: AC
  Administered 2019-02-23: 40 meq via ORAL
  Filled 2019-02-23: qty 2

## 2019-02-23 MED ORDER — INSULIN DETEMIR 100 UNIT/ML ~~LOC~~ SOLN
0.0750 [IU]/kg | Freq: Two times a day (BID) | SUBCUTANEOUS | Status: DC
  Administered 2019-02-23 – 2019-02-25 (×3): 7 [IU] via SUBCUTANEOUS
  Filled 2019-02-23 (×7): qty 0.07

## 2019-02-23 NOTE — Progress Notes (Signed)
Pt arrived to 5W06 from [facility]. Monitor connected. VS: 117 62, 14RR, 56HR, 96 spO2 on RA. No c/o other than hiccups. Pt oriented to room. Bed low. Call bell within reach.

## 2019-02-23 NOTE — Progress Notes (Signed)
Pt's daughter Delynn called for update. Pt states daughter Delynn could get information. Gave update to daughter Delynn and she wants MD to call her in the morning. Will continue to monitor pt. Ranelle Oyster, RN

## 2019-02-23 NOTE — Progress Notes (Signed)
   02/23/19 1137  Family/Significant Other Communication  Family/Significant Other Update Updated (Daughter Delynn)

## 2019-02-23 NOTE — Progress Notes (Addendum)
PROGRESS NOTE    Jose Higgins  MPN:[id number] DOB: 03-07-1940 DOA: 02/21/2019 PCP: System, Provider Not In   Brief Narrative:  79 year old male with history of HTN, DM 2, who tested positive for Covid on 1/25 and initially seen in the ED and discharged home, then returned later that day because he was felt weak.  He reports 3 to 4 days of fever, myalgia, productive cough with yellow sputum, dyspnea, nausea and vomiting.  He is also been having poor oral intake over the last several day.  He was hypoxic requiring 2 L nasal cannula in the ED, and chest x-ray was concerning for viral pneumonia.  Patient also reports significant ataxia for the last 3 to 4 days.    He's doing well from respiratory standpoint, on room air today, 1/27.  Plan to transfer to [facility] on 1/27 for evaluation of vertigo/ataxia and MRI - will plan to complete hospital course at [facility] unless medical necessity for back transfer to [facility].  Assessment & Plan:   Active Problems:   Respiratory tract infection due to COVID-19 virus  Acute Hypoxic Respiratory Failure due to Covid-19 Viral Illness -currently on RA -CXR 1/25 with patchy opacity in L mid to lower lung -Patient was started on remdesivir scheduled to be done on 1/29 -He was also started on steroids with Decadron -Continue supportive treatment with antitussives, inhalers, incentive spirometry, flutter valve -Continue to monitor inflammatory markers  COVID-19 Labs  Recent Labs    02/21/19 0109 02/22/19 0538 02/23/19 0340  DDIMER 1.10* 2.10* 1.92*  FERRITIN 196  --   --   LDH 170  --   --   CRP 10.1*  --   --     No results found for: SARSCOV2NAA  Ataxia, dizziness -Patient at baseline quite active, goes to the gym couple days Rashiya Lofland week and golfs 2 to 3 days Jakaya Jacobowitz week, independent at baseline.  Now he has difficulties walking, he feels generally weak but leaning towards the right side.  This happened sometime last week after he got sick. -Head CT  without acute abnormality -[facility]  Essential hypertension -Blood pressure stable, continue metoprolol, hold home irbesartan  Bradycardia - decrease metoprolol dose, add holding parameters  DM 2 -Continue sliding scale insulin -levemir, tradjenta   Hyperlipidemia -Continue atorvastatin  Hyponatremia  -Likely due to dehydration, monitor sodium, improved with IV fluids  Hypokalemia:  - replace and follow  DVT prophylaxis: lovenox Code Status: full Family Communication: none at bedside - wife over phone Disposition Plan:  . Patient came from:home            . Anticipated d/c place:SNF . Barriers to d/c OR conditions which need to be met to effect Johnisha Louks safe d/c: pending completion of remdesivir   Consultants:   none  Procedures:   none  Antimicrobials:  Anti-infectives (From admission, onward)   Start     Dose/Rate Route Frequency Ordered Stop   02/22/19 1000  remdesivir 100 mg in sodium chloride 0.9 % 100 mL IVPB     100 mg 200 mL/hr over 30 Minutes Intravenous Daily 02/21/19 1919 02/26/19 0959   02/21/19 2100  remdesivir 200 mg in sodium chloride 0.9% 250 mL IVPB     200 mg 580 mL/hr over 30 Minutes Intravenous Once 02/21/19 1919 02/21/19 2233      Subjective: No new complaints Still some dizziness  Objective: Vitals:   02/23/19 0000 02/23/19 0341 02/23/19 0817 02/23/19 1110  BP:  (!) 106/57 118/61 113/70  Pulse: (!) 51 63 63   Resp: 19 18 19    Temp:  98.9 F (37.2 C) 98.6 F (37 C)   TempSrc:  Oral Oral   SpO2: 92% 94% 94%   Weight:      Height:        Intake/Output Summary (Last 24 hours) at 02/23/2019 1340 Last data filed at 02/23/2019 0817 Gross per 24 hour  Intake 545 ml  Output 250 ml  Net 295 ml   Filed Weights   02/21/19 1800 02/21/19 1810  Weight: 87.1 kg 87.1 kg    Examination:  General exam: Appears calm and comfortable  Respiratory system: Clear to auscultation. Respiratory effort  normal. Cardiovascular system: S1 & S2 heard, RRR.  Gastrointestinal system: Abdomen is nondistended, soft and nontender. Central nervous system: Alert and oriented. CN 2-12 intact.  5/5 strength upper and lower.  Ataxia with FNF on L.  H2S wnl bilaterally. Extremities: no LEE Skin: No rashes, lesions or ulcers Psychiatry: Judgement and insight appear normal. Mood & affect appropriate.     Data Reviewed: I have personally reviewed following labs and imaging studies  CBC: Recent Labs  Lab 02/21/19 0109 02/22/19 0538 02/23/19 0340  WBC 5.9 5.3 6.6  NEUTROABS 4.8 4.7 5.2  HGB 12.4* 11.8* 11.5*  HCT 35.7* 34.1* 33.1*  MCV 91.1 89.7 90.4  PLT 130* 121* 196*   Basic Metabolic Panel: Recent Labs  Lab 02/21/19 0109 02/21/19 1957 02/22/19 0538 02/23/19 0340  NA 127* 128* 130* 130*  K 3.9 3.6 3.5 3.4*  CL 95* 96* 97* 98  CO2 23 22 22 23   GLUCOSE 212* 205* 200* 180*  BUN 12 15 13 21   CREATININE 0.95 1.06 1.04 1.09  CALCIUM 8.3* 7.9* 7.3* 7.3*  MG  --   --  1.6* 2.1  PHOS  --   --  1.4* 2.0*   GFR: Estimated Creatinine Clearance: 56.7 mL/min (by C-G formula based on SCr of 1.09 mg/dL). Liver Function Tests: Recent Labs  Lab 02/21/19 0109 02/22/19 0538 02/23/19 0340  AST 25 34 40  ALT 21 24 25   ALKPHOS 38 32* 28*  BILITOT 2.0* 1.3* 1.4*  PROT 6.5 6.2* 5.7*  ALBUMIN 3.2* 2.8* 2.6*   No results for input(s): LIPASE, AMYLASE in the last 168 hours. No results for input(s): AMMONIA in the last 168 hours. Coagulation Profile: No results for input(s): INR, PROTIME in the last 168 hours. Cardiac Enzymes: No results for input(s): CKTOTAL, CKMB, CKMBINDEX, TROPONINI in the last 168 hours. BNP (last 3 results) No results for input(s): PROBNP in the last 8760 hours. HbA1C: Recent Labs    02/22/19 1003  HGBA1C 7.1*   CBG: Recent Labs  Lab 02/22/19 1116 02/22/19 1654 02/22/19 2027 02/23/19 0742 02/23/19 1142  GLUCAP 162* 204* 223* 215* 223*   Lipid  Profile: Recent Labs    02/21/19 0109  TRIG 65   Thyroid Function Tests: No results for input(s): TSH, T4TOTAL, FREET4, T3FREE, THYROIDAB in the last 72 hours. Anemia Panel: Recent Labs    02/21/19 0109  FERRITIN 196   Sepsis Labs: Recent Labs  Lab 02/21/19 0109  PROCALCITON <0.10  LATICACIDVEN 1.3    Recent Results (from the past 240 hour(s))  Blood Culture (routine x 2)     Status: None (Preliminary result)   Collection Time: 02/21/19  1:00 AM   Specimen: BLOOD LEFT FOREARM  Result Value Ref Range Status   Specimen Description   Final  BLOOD LEFT FOREARM Performed at [facility], [address] [place], [place] [address]    Special Requests   Final    BOTTLES DRAWN AEROBIC AND ANAEROBIC Blood Culture adequate volume Performed at  [facility], [address] [place], [place] [address]    Culture   Final    NO GROWTH 2 DAYS Performed at [facility], [place] [address], [place], [place] [address]    Report Status PENDING  Incomplete  SARS Coronavirus 2 Ag (30 min TAT) - Nasal Swab (BD Veritor Kit)     Status: Abnormal   Collection Time: 02/21/19  1:09 AM   Specimen: Nasal Swab (BD Veritor Kit)  Result Value Ref Range Status   SARS Coronavirus 2 Ag POSITIVE (Windle Huebert) NEGATIVE Final    Comment: RESULT CALLED TO, READ BACK BY AND VERIFIED WITH: MAYNARD,C,RN @ 0153 02/21/19 BY GWYN,P (NOTE) SARS-CoV-2 antigen PRESENT. Positive results indicate the presence of viral antigens, but clinical correlation with patient history and other diagnostic information is necessary to determine patient infection status.  Positive results do not rule out bacterial infection or co-infection  with other viruses. False positive results are rare but can occur, and confirmatory RT-PCR testing may be appropriate in some circumstances. The expected result is Negative. Fact Sheet for Patients: PodPark.tn Fact Sheet for Providers:  GiftContent.is  This test is not yet approved or cleared by the [place] FDA and  has been authorized for detection and/or diagnosis of SARS-CoV-2 by FDA under an Emergency Use Authorization (EUA).  This EUA will remain in effect (meaning this test can be used) for the duration of  the COVID-19 decla ration under Section 564(b)(1) of the Act, 21 U.S.C. section 360bbb-3(b)(1), unless the authorization is terminated or revoked sooner. Performed at [facility], [address] [place], [place] [address]   Blood Culture (routine x 2)     Status: None (Preliminary result)   Collection Time: 02/21/19  1:10 AM   Specimen: BLOOD LEFT ARM  Result Value Ref Range Status   Specimen Description   Final    BLOOD LEFT ARM Performed at [facility], [address] [place], [place] [address]    Special Requests   Final    BOTTLES DRAWN AEROBIC AND ANAEROBIC Blood Culture adequate volume Performed at [facility], [address] [place], [place] [address]    Culture   Final    NO GROWTH 2 DAYS Performed at [facility], [place] [address] [place], [place] [address]    Report Status PENDING  Incomplete         Radiology Studies: CT HEAD WO CONTRAST  Result Date: 02/22/2019 CLINICAL DATA:  Ataxia.  Rule out stroke EXAM: CT HEAD WITHOUT CONTRAST TECHNIQUE: Contiguous axial images were obtained from the base of the skull through the vertex without intravenous contrast. COMPARISON:  None. FINDINGS: Brain: Mild cortical volume loss. No hydrocephalus. Negative for infarct, hemorrhage, mass. Vascular: Negative for hyperdense vessel. Skull: Negative Sinuses/Orbits: Negative Other: None IMPRESSION: No acute abnormality. Electronically Signed   By: Franchot Gallo M.D.   On: 02/22/2019 14:43   DG Chest Port 1 View  Result Date: 02/21/2019 CLINICAL DATA:  Shortness of breath EXAM: PORTABLE CHEST 1 VIEW COMPARISON:  02/21/2019 FINDINGS:  Patchy airspace opacity in the left mid to lower lung. No pleural effusion. Normal heart size. No pneumothorax. IMPRESSION: Vague patchy opacity in the left mid  to lower lung may reflect mild pneumonia Electronically Signed   By: Donavan Foil M.D.   On: 02/21/2019 18:40        Scheduled Meds: . albuterol  2 puff Inhalation Q6H  . alfuzosin  10 mg Oral Q breakfast  . vitamin C  500 mg Oral Daily  . atorvastatin  20 mg Oral Daily  . dexamethasone  6 mg Oral Q24H  . docusate sodium  100 mg Oral BID  . enoxaparin (LOVENOX) injection  40 mg Subcutaneous Q24H  . feeding supplement (ENSURE ENLIVE)  237 mL Oral TID BM  . insulin aspart  0-15 Units Subcutaneous TID WC  . insulin aspart  0-5 Units Subcutaneous QHS  . metoprolol tartrate  50 mg Oral BID  . senna  1 tablet Oral BID  . zinc sulfate  220 mg Oral Daily   Continuous Infusions: . remdesivir 100 mg in NS 100 mL 100 mg (02/23/19 0829)     LOS: 2 days    Time spent: over 30 min    Fayrene Helper, MD [facility]   To contact the attending provider between 7A-7P or the covering provider during after hours 7P-7A, please log into the web site [email or web address] and access using universal [place] password for that web site. If you do not have the password, please call the hospital operator.  02/23/2019, 1:40 PM

## 2019-02-23 NOTE — Progress Notes (Addendum)
Occupational Therapy Evaluation Patient Details Name: Jose Higgins MRN: [id number] DOB: [birth year] Today's Date: 02/23/2019    History of Present Illness 79 y.o. male admitted on 02/21/19 with N/V, myalgias, cough, SOB.  Pt dx with acute hypoxic respiratory failure due to COVID 19 viral illness.  Pt also with sudden, significant imbalance and ataxia.  CT scan negative for acute stroke.  MRI is pending.  Pt with other significant PMH of HTN, DM.     Clinical Impression   Patient lives in independent living with wife and is regularly very active.  He was on room air throuout session and maintained SpO2 > 94.  Patient stated his vision is a little different, that it takes him longer to get things into focus.  Tracking, saccades, field of vision and convergence were all normal.  He got to EOB with mod independence, and required min/mod with transfers and mobility.  He leans heavily towards the right when standing, but bilateral strength was symmetrical. Coordination was slightly impaired with dysdiadochinesis, will further evaluate.   Stated he felt "a hair" dizzy throughout session.  BP was stable throughout at 112/72.  Will continue to follow with OT acutely to address the deficits listed below.     Follow Up Recommendations  SNF rehab at his living facility   Equipment Recommendations  3 in 1 bedside commode;Other (comment)(rolling walker)    Recommendations for Other Services       Precautions / Restrictions Precautions Precautions: Fall Precaution Comments: right lean when standing Restrictions Weight Bearing Restrictions: No      Mobility Bed Mobility Overal bed mobility: Modified Independent                Transfers Overall transfer level: Needs assistance Equipment used: Rolling walker (2 wheeled);None Transfers: Sit to/from [employer] to Stand: Min assist Stand pivot transfers: Mod assist            Balance Overall balance assessment:  Needs assistance Sitting-balance support: Feet supported;No upper extremity supported Sitting balance-Leahy Scale: Good   Postural control: Right lateral lean Standing balance support: Bilateral upper extremity supported Standing balance-Leahy Scale: Poor Standing balance comment: Needs external support in standing from AD and therapist.                            ADL either performed or assessed with clinical judgement   ADL Overall ADL's : Needs assistance/impaired Eating/Feeding: Set up;Sitting   Grooming: Wash/dry hands;Oral care;Min guard;Standing   Upper Body Bathing: Sitting;Min guard   Lower Body Bathing: Set up;Sitting/lateral leans;Minimal assistance   Upper Body Dressing : Sitting;Min guard   Lower Body Dressing: Sitting/lateral leans;Moderate assistance   Toilet Transfer: Stand-pivot;BSC;Moderate assistance   Toileting- Clothing Manipulation and Hygiene: Sitting/lateral lean;Min guard       Functional mobility during ADLs: Minimal assistance;Moderate assistance;Rolling walker General ADL Comments: Leans to right side when walking     Vision Baseline Vision/History: Wears glasses Wears Glasses: At all times Patient Visual Report: Blurring of vision;Other (comment)(Having trouble bring things into focus) Vision Assessment?: Yes Eye Alignment: Within Functional Limits Ocular Range of Motion: Within Functional Limits Alignment/Gaze Preference: Within Defined Limits Tracking/Visual Pursuits: Able to track stimulus in all quads without difficulty Saccades: Within functional limits Convergence: Within functional limits Visual Fields: No apparent deficits     Perception     Praxis      Pertinent Vitals/Pain Pain Assessment: No/denies pain     Hand  Dominance Right   Extremity/Trunk Assessment Upper Extremity Assessment Upper Extremity Assessment: Generalized weakness(Symmetrical )           Communication Communication Communication:  Other (comment)(Slight expressive difficulties)   Cognition Arousal/Alertness: Awake/alert Behavior During Therapy: Restless Overall Cognitive Status: No family/caregiver present to determine baseline cognitive functioning Area of Impairment: Safety/judgement                       Following Commands: Follows multi-step commands inconsistently Safety/Judgement: Decreased awareness of safety Awareness: Emergent Problem Solving: Slow processing;Difficulty sequencing;Requires verbal cues;Requires tactile cues General Comments: Demonstrated awareness of deficits, acknowledged that he was leaning to the right and that he felt a little off from normal.    General Comments       Exercises     Shoulder Instructions      Home Living Family/patient expects to be discharged to:: Private residence Living Arrangements: Spouse/significant other Available Help at Discharge: Family Type of Home: Independent living facility Home Access: Level entry     Home Layout: One level     Bathroom Shower/Tub: Occupational psychologist: Standard Bathroom Accessibility: Yes   Home Equipment: None          Prior Functioning/Environment Level of Independence: Independent                 OT Problem List: Decreased strength;Decreased activity tolerance;Impaired balance (sitting and/or standing);Impaired vision/perception;Decreased coordination;Decreased safety awareness;Decreased cognition;Decreased knowledge of precautions;Cardiopulmonary status limiting activity      OT Treatment/Interventions: Self-care/ADL training;Therapeutic exercise;Neuromuscular education;Energy conservation;DME and/or AE instruction;Therapeutic activities;Visual/perceptual remediation/compensation;Balance training;Patient/family education    OT Goals(Current goals can be found in the care plan section) Acute Rehab OT Goals Patient Stated Goal: Go back to golfing OT Goal Formulation: With  patient Time For Goal Achievement: 03/09/19 Potential to Achieve Goals: Good ADL Goals Pt Will Perform Grooming: with modified independence;standing Pt Will Transfer to Toilet: with modified independence;ambulating Pt/caregiver will Perform Home Exercise Program: With theraband;Both right and left upper extremity Additional ADL Goal #1: Patient will increase safety awareness during ADLs Additional ADL Goal #2: Patient will verbalize 3 fall prevention strategies to enact at home.  OT Frequency: Min 3X/week   Barriers to D/C:            Co-evaluation              AM-PAC OT "6 Clicks" Daily Activity     Outcome Measure Help from another person eating meals?: None Help from another person taking care of personal grooming?: A Little Help from another person toileting, which includes using toliet, bedpan, or urinal?: A Lot Help from another person bathing (including washing, rinsing, drying)?: A Little Help from another person to put on and taking off regular upper body clothing?: A Little Help from another person to put on and taking off regular lower body clothing?: A Little 6 Click Score: 18   End of Session Equipment Utilized During Treatment: Gait belt;Rolling walker Nurse Communication: Mobility status  Activity Tolerance: Patient tolerated treatment well Patient left: in chair;with call bell/phone within reach;with chair alarm set  OT Visit Diagnosis: Unsteadiness on feet (R26.81);Other abnormalities of gait and mobility (R26.89);Muscle weakness (generalized) (M62.81);Other symptoms and signs involving the nervous system (R29.898);Other symptoms and signs involving cognitive function                Time: [id number] OT Time Calculation (min): 60 min Charges:  OT General Charges $OT Visit: 1 Visit OT Evaluation $OT  Eval Moderate Complexity: 1 Mod OT Treatments $Self Care/Home Management : 38-52 mins  August Luz, OTR/L   Phylliss Bob 02/23/2019, 3:15 PM

## 2019-02-23 NOTE — Progress Notes (Signed)
Items placed in bag along with inhaler. Discharged via ambulance with personal items.   02/23/19 1349  Patient Belongings  Patient/Family advised about valuables policy? Yes  Belongings at Bedside Clothing;Glasses

## 2019-02-23 NOTE — Progress Notes (Signed)
Patient resides at [place]. Per [facility], he would be able to go to their SNF side with COVID if patient is agreeable.   Percell Locus Antania Hoefling LCSW [phone]

## 2019-02-24 ENCOUNTER — Ambulatory Visit (HOSPITAL_COMMUNITY): Payer: PPO

## 2019-02-24 LAB — CBC WITH DIFFERENTIAL/PLATELET
Abs Immature Granulocytes: 0.06 10*3/uL (ref 0.00–0.07)
Basophils Absolute: 0 10*3/uL (ref 0.0–0.1)
Basophils Relative: 0 %
Eosinophils Absolute: 0 10*3/uL (ref 0.0–0.5)
Eosinophils Relative: 0 %
HCT: 32.5 % — ABNORMAL LOW (ref 39.0–52.0)
Hemoglobin: 11.5 g/dL — ABNORMAL LOW (ref 13.0–17.0)
Immature Granulocytes: 1 %
Lymphocytes Relative: 13 %
Lymphs Abs: 0.8 10*3/uL (ref 0.7–4.0)
MCH: 31.2 pg (ref 26.0–34.0)
MCHC: 35.4 g/dL (ref 30.0–36.0)
MCV: 88.1 fL (ref 80.0–100.0)
Monocytes Absolute: 0.5 10*3/uL (ref 0.1–1.0)
Monocytes Relative: 8 %
Neutro Abs: 4.7 10*3/uL (ref 1.7–7.7)
Neutrophils Relative %: 78 %
Platelets: 174 10*3/uL (ref 150–400)
RBC: 3.69 MIL/uL — ABNORMAL LOW (ref 4.22–5.81)
RDW: 12.1 % (ref 11.5–15.5)
WBC: 6.1 10*3/uL (ref 4.0–10.5)
nRBC: 0 % (ref 0.0–0.2)

## 2019-02-24 LAB — COMPREHENSIVE METABOLIC PANEL
ALT: 30 U/L (ref 0–44)
AST: 34 U/L (ref 15–41)
Albumin: 2.3 g/dL — ABNORMAL LOW (ref 3.5–5.0)
Alkaline Phosphatase: 28 U/L — ABNORMAL LOW (ref 38–126)
Anion gap: 7 (ref 5–15)
BUN: 20 mg/dL (ref 8–23)
CO2: 23 mmol/L (ref 22–32)
Calcium: 7.8 mg/dL — ABNORMAL LOW (ref 8.9–10.3)
Chloride: 104 mmol/L (ref 98–111)
Creatinine, Ser: 0.93 mg/dL (ref 0.61–1.24)
GFR calc Af Amer: >60 mL/min (ref >60)
GFR calc non Af Amer: >60 mL/min (ref >60)
Glucose, Bld: 199 mg/dL — ABNORMAL HIGH (ref 70–99)
Potassium: 3.4 mmol/L — ABNORMAL LOW (ref 3.5–5.1)
Sodium: 134 mmol/L — ABNORMAL LOW (ref 135–145)
Total Bilirubin: 1.1 mg/dL (ref 0.3–1.2)
Total Protein: 5.5 g/dL — ABNORMAL LOW (ref 6.5–8.1)

## 2019-02-24 LAB — GLUCOSE, CAPILLARY
Glucose-Capillary: 177 mg/dL — ABNORMAL HIGH (ref 70–99)
Glucose-Capillary: 234 mg/dL — ABNORMAL HIGH (ref 70–99)
Glucose-Capillary: 152 mg/dL — ABNORMAL HIGH (ref 70–99)
Glucose-Capillary: 178 mg/dL — ABNORMAL HIGH (ref 70–99)

## 2019-02-24 LAB — D-DIMER, QUANTITATIVE: D-Dimer, Quant: 1.25 ug/mL-FEU — ABNORMAL HIGH (ref 0.00–0.50)

## 2019-02-24 LAB — C-REACTIVE PROTEIN: CRP: 8.8 mg/dL — ABNORMAL HIGH (ref <1.0)

## 2019-02-24 LAB — PHOSPHORUS: Phosphorus: 1.9 mg/dL — ABNORMAL LOW (ref 2.5–4.6)

## 2019-02-24 LAB — MAGNESIUM: Magnesium: 2.2 mg/dL (ref 1.7–2.4)

## 2019-02-24 LAB — FERRITIN: Ferritin: 525 ng/mL — ABNORMAL HIGH (ref 24–336)

## 2019-02-24 MED ORDER — POTASSIUM & SODIUM PHOSPHATES 280-160-250 MG PO PACK
1.0000 | PACK | Freq: Three times a day (TID) | ORAL | Status: DC
  Administered 2019-02-24 – 2019-02-25 (×4): 1 via ORAL
  Filled 2019-02-24 (×7): qty 1

## 2019-02-24 MED ORDER — CETIRIZINE HCL 5 MG/5ML PO SOLN
5.0000 mg | Freq: Every day | ORAL | Status: AC
  Administered 2019-02-24: 5 mg via ORAL
  Filled 2019-02-24 (×2): qty 5

## 2019-02-24 NOTE — Progress Notes (Signed)
PROGRESS NOTE    Jose Higgins  [id number] DOB: [birth year] DOA: 02/21/2019 PCP: System, Provider Not In   Brief Narrative: 79 year old gentleman with prior h/o hypertension, diabetes mellitus , tested positive for COVID 19 on 02/21/19 presents to ED for fever, cough, generalized weakness and initially required 2l it of [place] oxygen and ataxia for 3 to 4 days prior to admission.  CXR consistent with viral pneumonia. He was admitted to [facility] but transferred to [facility] for MRI brain.  MRI brain does not show any acute stroke. PT/OT eval recommending SNF.   Assessment & Plan:   Active Problems:   Respiratory tract infection due to COVID-19 virus   Acute respiratory failure with hypoxia secondary to COVID 19 viral pneumonia:  - improving, we were able tto wean him off oxygen.  Last dose of Remdesivir scheduled for tomorrow.  Continue with decadron and IS, FLUTTER valve, and anti tussives, inhalers.  - CRP is 8.8, Ferritin 525, improving d dimer to 1.25.    Ataxia:  - MRI ruled out acute stroke.  Recommend PT/OT evaluation-- to SNF for continued rehab.    Hypertension:  Well controlled.  Check orthostatic vital signs.    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  CBG (last 3)  Recent Labs    02/23/19 1551 02/23/19 2036 02/24/19 0756  GLUCAP 268* 274* 177*   RESUME SSI. Add 4 units of novolog TIDAC if eating more than 50% of his meals.     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     DVT prophylaxis: lovenox.  Code Status: full code.  Family Communication: discussed with daughter over the phone.  Disposition Plan:  . Patient came from: [facility].             . Anticipated d/c place: SNF possibly to [facility]/ pending.  . Barriers to d/c OR conditions which need to be met to effect a safe d/c: completion of remdesevir tomorrow.    Consultants:   Curb side discussion with Dr Rory Percy  regarding the MRI brain.   Procedures: None.  Antimicrobials: none.   Subjective: Still coughing,   Objective: Vitals:   02/23/19 1955 02/23/19 2215 02/24/19 0554 02/24/19 0800  BP: 110/70  (!) 108/45 120/89  Pulse: (!) 59 (!) 53  (!) 57  Resp: 19 14  14   Temp: 98.1 F (36.7 C)  97.7 F (36.5 C) 97.7 F (36.5 C)  TempSrc:    Oral  SpO2: 96% 93%  92%  Weight:      Height:        Intake/Output Summary (Last 24 hours) at 02/24/2019 1222 Last data filed at 02/24/2019 0800 Gross per 24 hour  Intake 220 ml  Output 850 ml  Net -630 ml   Filed Weights   02/21/19 1800 02/21/19 1810  Weight: 87.1 kg 87.1 kg    Examination:  General exam: Appears calm and comfortable  Respiratory system: tachypneic, diminished air entry at bases, no rhonchi.  Cardiovascular system: S1 & S2 heard, RRR. No JVD, murmurs,  No pedal edema. Gastrointestinal system: Abdomen is nondistended, soft and nontender. Normal bowel sounds heard. Central nervous system: Alert and oriented. No focal neurological deficits. Extremities: Symmetric 5 x 5 power. Skin: No rashes, lesions or ulcers Psychiatry:  Anxious appearing.     Data Reviewed: I have personally reviewed following labs and imaging studies  CBC: Recent Labs  Lab 02/21/19 0109 02/22/19 7793  02/23/19 0340 02/24/19 0243  WBC 5.9 5.3 6.6 6.1  NEUTROABS 4.8 4.7 5.2 4.7  HGB 12.4* 11.8* 11.5* 11.5*  HCT 35.7* 34.1* 33.1* 32.5*  MCV 91.1 89.7 90.4 88.1  PLT 130* 121* 147* 947   Basic Metabolic Panel: Recent Labs  Lab 02/21/19 0109 02/21/19 1957 02/22/19 0538 02/23/19 0340 02/24/19 0243  NA 127* 128* 130* 130* 134*  K 3.9 3.6 3.5 3.4* 3.4*  CL 95* 96* 97* 98 104  CO2 23 22 22 23 23   GLUCOSE 212* 205* 200* 180* 199*  BUN 12 15 13 21 20   CREATININE 0.95 1.06 1.04 1.09 0.93  CALCIUM 8.3* 7.9* 7.3* 7.3* 7.8*  MG  --   --  1.6* 2.1 2.2  PHOS  --   --  1.4* 2.0* 1.9*   GFR: Estimated Creatinine Clearance: 66.5 mL/min (by C-G  formula based on SCr of 0.93 mg/dL). Liver Function Tests: Recent Labs  Lab 02/21/19 0109 02/22/19 0538 02/23/19 0340 02/24/19 0243  AST 25 34 40 34  ALT 21 24 25 30   ALKPHOS 38 32* 28* 28*  BILITOT 2.0* 1.3* 1.4* 1.1  PROT 6.5 6.2* 5.7* 5.5*  ALBUMIN 3.2* 2.8* 2.6* 2.3*   No results for input(s): LIPASE, AMYLASE in the last 168 hours. No results for input(s): AMMONIA in the last 168 hours. Coagulation Profile: No results for input(s): INR, PROTIME in the last 168 hours. Cardiac Enzymes: No results for input(s): CKTOTAL, CKMB, CKMBINDEX, TROPONINI in the last 168 hours. BNP (last 3 results) No results for input(s): PROBNP in the last 8760 hours. HbA1C: Recent Labs    02/22/19 1003  HGBA1C 7.1*   CBG: Recent Labs  Lab 02/23/19 0742 02/23/19 1142 02/23/19 1551 02/23/19 2036 02/24/19 0756  GLUCAP 215* 223* 268* 274* 177*   Lipid Profile: No results for input(s): CHOL, HDL, LDLCALC, TRIG, CHOLHDL, LDLDIRECT in the last 72 hours. Thyroid Function Tests: No results for input(s): TSH, T4TOTAL, FREET4, T3FREE, THYROIDAB in the last 72 hours. Anemia Panel: Recent Labs    02/24/19 0243  FERRITIN 525*   Sepsis Labs: Recent Labs  Lab 02/21/19 0109  PROCALCITON <0.10  LATICACIDVEN 1.3    Recent Results (from the past 240 hour(s))  Blood Culture (routine x 2)     Status: None (Preliminary result)   Collection Time: 02/21/19  1:00 AM   Specimen: BLOOD LEFT FOREARM  Result Value Ref Range Status   Specimen Description   Final    BLOOD LEFT FOREARM Performed at [facility], [address] [place], [place] [address]    Special Requests   Final    BOTTLES DRAWN AEROBIC AND ANAEROBIC Blood Culture adequate volume Performed at [facility], [address], [place], [place] [address]    Culture   Final    NO GROWTH 3 DAYS Performed at [facility], [place] [address], [place], [place] [address]    Report Status PENDING  Incomplete  SARS  Coronavirus 2 Ag (30 min TAT) - Nasal Swab (BD Veritor Kit)     Status: Abnormal   Collection Time: 02/21/19  1:09 AM   Specimen: Nasal Swab (BD Veritor Kit)  Result Value Ref Range Status   SARS Coronavirus 2 Ag POSITIVE (A) NEGATIVE Final    Comment: RESULT CALLED TO, READ BACK BY AND VERIFIED WITH: MAYNARD,C,RN @ 0153 02/21/19 BY GWYN,P (NOTE) SARS-CoV-2 antigen PRESENT. Positive results indicate the presence of viral antigens, but clinical correlation with patient history  and other diagnostic information is necessary to determine patient infection status.  Positive results do not rule out bacterial infection or co-infection  with other viruses. False positive results are rare but can occur, and confirmatory RT-PCR testing may be appropriate in some circumstances. The expected result is Negative. Fact Sheet for Patients: PodPark.tn Fact Sheet for Providers: GiftContent.is  This test is not yet approved or cleared by the [place] FDA and  has been authorized for detection and/or diagnosis of SARS-CoV-2 by FDA under an Emergency Use Authorization (EUA).  This EUA will remain in effect (meaning this test can be used) for the duration of  the COVID-19 decla ration under Section 564(b)(1) of the Act, 21 U.S.C. section 360bbb-3(b)(1), unless the authorization is terminated or revoked sooner. Performed at [facility], [address] [place], [place] [address]   Blood Culture (routine x 2)     Status: None (Preliminary result)   Collection Time: 02/21/19  1:10 AM   Specimen: BLOOD LEFT ARM  Result Value Ref Range Status   Specimen Description   Final    BLOOD LEFT ARM Performed at [facility], [address] [place], [place] [address]    Special Requests   Final    BOTTLES DRAWN AEROBIC AND ANAEROBIC Blood Culture adequate volume Performed at [facility], [address] [place], [place] [address]    Culture   Final    NO GROWTH 3 DAYS Performed at [facility], [place] [address] [place], [place] [address]    Report Status PENDING  Incomplete         Radiology Studies: CT HEAD WO CONTRAST  Result Date: 02/22/2019 CLINICAL DATA:  Ataxia.  Rule out stroke EXAM: CT HEAD WITHOUT CONTRAST TECHNIQUE: Contiguous axial images were obtained from the base of the skull through the vertex without intravenous contrast. COMPARISON:  None. FINDINGS: Brain: Mild cortical volume loss. No hydrocephalus. Negative for infarct, hemorrhage, mass. Vascular: Negative for hyperdense vessel. Skull: Negative Sinuses/Orbits: Negative Other: None IMPRESSION: No acute abnormality. Electronically Signed   By: Franchot Gallo M.D.   On: 02/22/2019 14:43   MR BRAIN WO CONTRAST  Result Date: 02/23/2019 CLINICAL DATA:  Ataxia.  Coronavirus infection.  Negative head CT. EXAM: MRI HEAD WITHOUT CONTRAST TECHNIQUE: Multiplanar, multiecho pulse sequences of the brain and surrounding structures were obtained without intravenous contrast. COMPARISON:  Head CT yesterday. FINDINGS: Brain: Diffusion imaging does not show any acute or subacute infarction. The brainstem is normal. Minimal small vessel change affects the cerebral hemispheric white matter, less than often seen at this age. No large vessel infarction. No mass lesion, hemorrhage, hydrocephalus or extra-axial collection. Vascular: Major vessels at the base of the brain show flow. Skull and upper cervical spine: Negative Sinuses/Orbits: Clear/normal Other: None IMPRESSION: No acute finding. Essentially normal study for age. Very few punctate foci of T2 and FLAIR signal affecting the hemispheric white matter, less than often seen at this age. Electronically Signed   By: Nelson Chimes M.D.   On: 02/23/2019 19:06        Scheduled Meds: . albuterol  2 puff Inhalation Q6H  . alfuzosin  10 mg Oral Q breakfast  . vitamin C  500 mg Oral Daily  .  atorvastatin  20 mg Oral Daily  . dexamethasone  6 mg Oral Q24H  . docusate sodium  100 mg Oral BID  . enoxaparin (LOVENOX) injection  40 mg Subcutaneous Q24H  .  feeding supplement (ENSURE ENLIVE)  237 mL Oral TID BM  . insulin aspart  0-15 Units Subcutaneous TID WC  . insulin aspart  0-5 Units Subcutaneous QHS  . insulin detemir  0.075 Units/kg Subcutaneous BID  . linagliptin  5 mg Oral Daily  . metoprolol tartrate  25 mg Oral BID  . potassium & sodium phosphates  1 packet Oral TID WC & HS  . senna  1 tablet Oral BID  . zinc sulfate  220 mg Oral Daily   Continuous Infusions: . remdesivir 100 mg in NS 100 mL 100 mg (02/24/19 0856)     LOS: 3 days    Time spent: 38 minutes.    Hosie Poisson, MD [facility]   To contact the attending provider between 7A-7P or the covering provider during after hours 7P-7A, please log into the web site [email or web address] and access using universal [place] password for that web site. If you do not have the password, please call the hospital operator.  02/24/2019, 12:22 PM

## 2019-02-24 NOTE — Progress Notes (Signed)
Physical Therapy Treatment Patient Details Name: BREANNA WICK MRN: [id number] DOB: 22-Oct-1940 Today's Date: 02/24/2019    History of Present Illness 79 y.o. male admitted on 02/21/19 with N/V, myalgias, cough, SOB.  Pt dx with acute hypoxic respiratory failure due to COVID 19 viral illness.  Pt also with sudden, significant imbalance and ataxia.  CT scan negative for acute stroke.  MRI also negative.  Pt with other significant PMH of HTN, DM.      PT Comments    Patient did much better than on previous visit. No longer leaning to his right or demonstrating ataxic-like gait. Does remain unsteady needing up to min assist for balance while walking. Noted MRI negative.    Follow Up Recommendations  SNF(SNF rehab at [facility]. )     Equipment Recommendations  Other (comment)(TBD at next venue)    Recommendations for Other Services       Precautions / Restrictions Precautions Precautions: Fall Restrictions Weight Bearing Restrictions: No    Mobility  Bed Mobility Overal bed mobility: Modified Independent             General bed mobility comments: supine<>sit  Transfers Overall transfer level: Needs assistance Equipment used: None Transfers: Sit to/from Stand Sit to Stand: Min assist         General transfer comment: initial attempt from EOB took 3 times before he made it to stand and assist due to posterior lean; with repetition progressed to no hands or imbalance  Ambulation/Gait Ambulation/Gait assistance: Min assist Gait Distance (Feet): 75 Feet(seated rest; 400 ft) Assistive device: None Gait Pattern/deviations: Step-through pattern;Drifts right/left;Wide base of support Gait velocity: decreased   General Gait Details: pt with occasional drifting step/almost stagger which significantly lessened when progressed out of room and in hallway with incr velocity; continues with wider steps due to feeling imbalanced   Stairs             Wheelchair  Mobility    Modified Rankin (Stroke Patients Only) Modified Rankin (Stroke Patients Only) Pre-Morbid Rankin Score: No symptoms Modified Rankin: Moderately severe disability     Balance Overall balance assessment: Needs assistance Sitting-balance support: Feet supported;No upper extremity supported Sitting balance-Leahy Scale: Good     Standing balance support: No upper extremity supported Standing balance-Leahy Scale: Poor Standing balance comment: progressed to fair by end of sessin                            Cognition Arousal/Alertness: Awake/alert Behavior During Therapy: WFL for tasks assessed/performed Overall Cognitive Status: No family/caregiver present to determine baseline cognitive functioning Area of Impairment: Safety/judgement                       Following Commands: Follows multi-step commands inconsistently Safety/Judgement: Decreased awareness of safety     General Comments: Initially felt he was safe to walk alone "if not hooked up to all these wires"; by end of session agreed it was not safe      Exercises General Exercises - Lower Extremity Hip ABduction/ADduction: AROM;Strengthening;Both;10 reps;Standing(2 sec hold; bil UE support at counter) Hip Flexion/Marching: AROM;Both;10 reps;Standing(marching; LUE support only on counter) Other Exercises Other Exercises: sit to stand x 5 reps off bed with no hands and slowly lower to sit    General Comments General comments (skin integrity, edema, etc.): Pt still with word-finding issues and reports he is "in a fog"      Pertinent Vitals/Pain Pain  Assessment: No/denies pain    Home Living                      Prior Function            PT Goals (current goals can now be found in the care plan section) Acute Rehab PT Goals Patient Stated Goal: Go back to golfing Time For Goal Achievement: 03/08/19 Potential to Achieve Goals: Good Progress towards PT goals: Progressing  toward goals    Frequency    Min 4X/week      PT Plan Current plan remains appropriate    Co-evaluation              AM-PAC PT "6 Clicks" Mobility   Outcome Measure  Help needed turning from your back to your side while in a flat bed without using bedrails?: None Help needed moving from lying on your back to sitting on the side of a flat bed without using bedrails?: None Help needed moving to and from a bed to a chair (including a wheelchair)?: A Little Help needed standing up from a chair using your arms (e.g., wheelchair or bedside chair)?: A Little Help needed to walk in hospital room?: A Little Help needed climbing 3-5 steps with a railing? : A Little 6 Click Score: 20    End of Session Equipment Utilized During Treatment: Gait belt Activity Tolerance: Patient tolerated treatment well Patient left: with call bell/phone within reach;in bed;with bed alarm set(pt refused recliner due to discomfort)   PT Visit Diagnosis: Muscle weakness (generalized) (M62.81);Difficulty in walking, not elsewhere classified (R26.2);Other symptoms and signs involving the nervous system (R29.898)     Time: [id number] PT Time Calculation (min) (ACUTE ONLY): 43 min  Charges:  $Gait Training: 23-37 mins $Therapeutic Exercise: 8-22 mins                      Arby Barrette, PT Pager [phone]    Rexanne Mano 02/24/2019, 6:06 PM

## 2019-02-24 NOTE — [place] FL2 (Signed)
[place] MEDICAID FL2 LEVEL OF CARE SCREENING TOOL     IDENTIFICATION  Patient Name: Jose Higgins Birthdate: 05-10-1940 Sex: male Admission Date (Current Location): 02/21/2019  [facility] and [place] Number:  Herbalist and Address:  [facility] [place], [place] [address], [place], [place] [address]      Provider Number: [id number]  Attending Physician Name and Address:  Hosie Poisson, MD  Relative Name and Phone Number:  Akeel Boles, [phone], Spouse    Current Level of Care: Hospital Recommended Level of Care: [place] Prior Approval Number:    Date Approved/Denied:   PASRR Number: [id number]  Discharge Plan: SNF    Current Diagnoses: Patient Active Problem List   Diagnosis Date Noted  . Respiratory tract infection due to COVID-19 virus 02/21/2019    Orientation RESPIRATION BLADDER Height & Weight     Self, Time, Situation, Place  Normal Continent Weight: 192 lb (87.1 kg) Height:  5\' 10"  (177.8 cm)  BEHAVIORAL SYMPTOMS/MOOD NEUROLOGICAL BOWEL NUTRITION STATUS      Continent Diet(Carb modified)  AMBULATORY STATUS COMMUNICATION OF NEEDS Skin   Extensive Assist Verbally Normal                       Personal Care Assistance Level of Assistance  Bathing, Feeding, Dressing, Total care Bathing Assistance: Limited assistance Feeding assistance: Independent Dressing Assistance: Maximum assistance Total Care Assistance: Limited assistance   Functional Limitations Info  Sight, Hearing, Speech Sight Info: Impaired Hearing Info: Adequate Speech Info: Adequate    SPECIAL CARE FACTORS FREQUENCY  PT (By licensed PT), OT (By licensed OT)     PT Frequency: 5x OT Frequency: 5x            Contractures Contractures Info: Not present    Additional Factors Info  Code Status, Allergies, Isolation Precautions, Psychotropic Code Status Info: FULL Allergies Info: NKA Psychotropic Info: see dc summary   Isolation  Precautions Info: COVID +     Current Medications (02/24/2019):  This is the current hospital active medication list Current Facility-Administered Medications  Medication Dose Route Frequency Provider Last Rate Last Admin  . acetaminophen (TYLENOL) tablet 650 mg  650 mg Oral Q6H PRN Bennie Pierini, MD      . albuterol (VENTOLIN HFA) 108 (90 Base) MCG/ACT inhaler 2 puff  2 puff Inhalation Q6H Bennie Pierini, MD   2 puff at 02/23/19 1752  . alfuzosin (UROXATRAL) 24 hr tablet 10 mg  10 mg Oral Q breakfast Bennie Pierini, MD   10 mg at 02/23/19 [phone]  . ascorbic acid (VITAMIN C) tablet 500 mg  500 mg Oral Daily Bennie Pierini, MD   500 mg at 02/24/19 0853  . atorvastatin (LIPITOR) tablet 20 mg  20 mg Oral Daily Bennie Pierini, MD   20 mg at 02/23/19 0819  . baclofen (LIORESAL) tablet 5 mg  5 mg Oral TID PRN Caren Griffins, MD   5 mg at 02/24/19 [id number]  . chlorpheniramine-HYDROcodone (TUSSIONEX) 10-8 MG/5ML suspension 5 mL  5 mL Oral Q12H PRN Bennie Pierini, MD      . dexamethasone (DECADRON) tablet 6 mg  6 mg Oral Q24H Bennie Pierini, MD   6 mg at 02/23/19 0336  . docusate sodium (COLACE) capsule 100 mg  100 mg Oral BID Bennie Pierini, MD   100 mg at 02/22/19 0735  . enoxaparin (LOVENOX) injection 40 mg  40 mg Subcutaneous Q24H  Bennie Pierini, MD   40 mg at 02/22/19 2100  . feeding supplement (ENSURE ENLIVE) (ENSURE ENLIVE) liquid 237 mL  237 mL Oral TID BM Caren Griffins, MD   237 mL at 02/23/19 1753  . guaiFENesin-dextromethorphan (ROBITUSSIN DM) 100-10 MG/5ML syrup 10 mL  10 mL Oral Q4H PRN Bennie Pierini, MD      . insulin aspart (novoLOG) injection 0-15 Units  0-15 Units Subcutaneous TID WC Bennie Pierini, MD   8 Units at 02/23/19 1753  . insulin aspart (novoLOG) injection 0-5 Units  0-5 Units Subcutaneous QHS Bennie Pierini, MD   3 Units at 02/23/19 2233  . insulin detemir (LEVEMIR) injection 7 Units  0.075 Units/kg Subcutaneous BID Elodia Florence., MD   7 Units at 02/23/19 2326  . linagliptin (TRADJENTA) tablet 5 mg  5 mg Oral Daily Elodia Florence., MD      . loperamide (IMODIUM) capsule 2 mg  2 mg Oral PRN Elodia Florence., MD   2 mg at 02/23/19 1752  . metoprolol tartrate (LOPRESSOR) tablet 25 mg  25 mg Oral BID Elodia Florence., MD      . ondansetron [facility]) tablet 4 mg  4 mg Oral Q6H PRN Bennie Pierini, MD       Or  . ondansetron [facility]) injection 4 mg  4 mg Intravenous Q6H PRN Bennie Pierini, MD      . polyethylene glycol (MIRALAX / GLYCOLAX) packet 17 g  17 g Oral Daily PRN Bennie Pierini, MD      . remdesivir 100 mg in sodium chloride 0.9 % 100 mL IVPB  100 mg Intravenous Daily Bennie Pierini, MD 200 mL/hr at 02/24/19 0856 100 mg at 02/24/19 0856  . senna (SENOKOT) tablet 8.6 mg  1 tablet Oral BID Bennie Pierini, MD   8.6 mg at 02/22/19 0736  . zinc sulfate capsule 220 mg  220 mg Oral Daily Bennie Pierini, MD   220 mg at 02/24/19 [id number]     Discharge Medications: Please see discharge summary for a list of discharge medications.  Relevant Imaging Results:  Relevant Lab Results:   Additional Information SSN: [id number]  Ralph Dowdy, Student-Social Work

## 2019-02-24 NOTE — TOC Initial Note (Signed)
Transition of Care [facility]) - Initial/Assessment Note    Patient Details  Name: Jose Higgins MRN: [id number] Date of Birth: 09-18-1940  Transition of Care [facility]) CM/SW Contact:    Ralph Dowdy, [place] Work Phone Number: 02/24/2019, 10:17 AM  Clinical Narrative:                 MSW received consult for possible SNF placement at time of discharge. CSW spoke with patient regarding PT recommendation of SNF placement at time of discharge. PT resides at [place]. Patient reported that patient's spouse is currently unable to care for patient at their home given patient's current physical needs and fall risk.  Patient expressed understanding of PT recommendation and is agreeable to SNF placement at time of discharge. He stated that he would like to go back to [facility] for rehab. MSW discussed insurance authorization process and provided Medicare SNF ratings list. Patient expressed being hopeful for rehab and to feel better soon. No further questions reported at this time. CSW to continue to follow and assist with discharge planning needs.  Expected Discharge Plan: Skilled Nursing Facility Barriers to Discharge: Continued Medical Work up   Patient Goals and CMS Choice Patient states their goals for this hospitalization and ongoing recovery are:: To get COVID + CMS Medicare.gov Compare Post Acute Care list provided to:: Patient Choice offered to / list presented to : Patient  Expected Discharge Plan and Services Expected Discharge Plan: [place] In-house Referral: Clinical Social Work Discharge Planning Services: CM Consult Post Acute Care Choice: [place] Living arrangements for the past 2 months: [place]                                      Prior Living Arrangements/Services Living arrangements for the past 2 months: [place] Lives with:: Self Patient language and need for interpreter  reviewed:: Yes Do you feel safe going back to the place where you live?: Yes      Need for Family Participation in Patient Care: Yes (Comment) Care giver support system in place?: Yes (comment)   Criminal Activity/Legal Involvement Pertinent to Current Situation/Hospitalization: No - Comment as needed  Activities of Daily Living Home Assistive Devices/Equipment: None ADL Screening (condition at time of admission) Patient's cognitive ability adequate to safely complete daily activities?: Yes Is the patient deaf or have difficulty hearing?: No Does the patient have difficulty seeing, even when wearing glasses/contacts?: No Does the patient have difficulty concentrating, remembering, or making decisions?: No Patient able to express need for assistance with ADLs?: No Does the patient have difficulty dressing or bathing?: No Independently performs ADLs?: Yes (appropriate for developmental age) Does the patient have difficulty walking or climbing stairs?: No Weakness of Legs: None Weakness of Arms/Hands: None  Permission Sought/Granted Permission sought to share information with : Case Manager, Family Supports Permission granted to share information with : Yes, Verbal Permission Granted  Share Information with NAME: Angelino, S[place] [phone]           Emotional Assessment Appearance:: Appears stated age Attitude/Demeanor/Rapport: Gracious, Engaged, Self-Confident, Charismatic Affect (typically observed): Accepting, Calm Orientation: : Oriented to Self, Oriented to Place, Oriented to  Time, Oriented to Situation Alcohol / Substance Use: Not Applicable Psych Involvement: No (comment)  Admission diagnosis:  Dehydration [E86.0] Respiratory tract infection due to COVID-19 virus [U07.1, J98.8] Patient Active Problem List  Diagnosis Date Noted  . Respiratory tract infection due to COVID-19 virus 02/21/2019   PCP:  System, Provider Not In Pharmacy:   [facility] [place] - [address] [place] [address] Phone: [phone] Fax: [phone]     Social Determinants of Health (SDOH) Interventions    Readmission Risk Interventions No flowsheet data found.

## 2019-02-25 LAB — CBC WITH DIFFERENTIAL/PLATELET
Abs Immature Granulocytes: 0.05 10*3/uL (ref 0.00–0.07)
Basophils Absolute: 0 10*3/uL (ref 0.0–0.1)
Basophils Relative: 0 %
Eosinophils Absolute: 0 10*3/uL (ref 0.0–0.5)
Eosinophils Relative: 0 %
HCT: 34.5 % — ABNORMAL LOW (ref 39.0–52.0)
Hemoglobin: 12.1 g/dL — ABNORMAL LOW (ref 13.0–17.0)
Immature Granulocytes: 1 %
Lymphocytes Relative: 13 %
Lymphs Abs: 1.1 10*3/uL (ref 0.7–4.0)
MCH: 31.8 pg (ref 26.0–34.0)
MCHC: 35.1 g/dL (ref 30.0–36.0)
MCV: 90.6 fL (ref 80.0–100.0)
Monocytes Absolute: 0.5 10*3/uL (ref 0.1–1.0)
Monocytes Relative: 5 %
Neutro Abs: 6.7 10*3/uL (ref 1.7–7.7)
Neutrophils Relative %: 81 %
Platelets: 191 10*3/uL (ref 150–400)
RBC: 3.81 MIL/uL — ABNORMAL LOW (ref 4.22–5.81)
RDW: 12.4 % (ref 11.5–15.5)
WBC: 8.3 10*3/uL (ref 4.0–10.5)
nRBC: 0 % (ref 0.0–0.2)

## 2019-02-25 LAB — COMPREHENSIVE METABOLIC PANEL
ALT: 33 U/L (ref 0–44)
AST: 32 U/L (ref 15–41)
Albumin: 2.4 g/dL — ABNORMAL LOW (ref 3.5–5.0)
Alkaline Phosphatase: 37 U/L — ABNORMAL LOW (ref 38–126)
Anion gap: 3 — ABNORMAL LOW (ref 5–15)
BUN: 16 mg/dL (ref 8–23)
CO2: 23 mmol/L (ref 22–32)
Calcium: 7.9 mg/dL — ABNORMAL LOW (ref 8.9–10.3)
Chloride: 108 mmol/L (ref 98–111)
Creatinine, Ser: 0.99 mg/dL (ref 0.61–1.24)
GFR calc Af Amer: >60 mL/min (ref >60)
GFR calc non Af Amer: >60 mL/min (ref >60)
Glucose, Bld: 176 mg/dL — ABNORMAL HIGH (ref 70–99)
Potassium: 3.9 mmol/L (ref 3.5–5.1)
Sodium: 134 mmol/L — ABNORMAL LOW (ref 135–145)
Total Bilirubin: 1.5 mg/dL — ABNORMAL HIGH (ref 0.3–1.2)
Total Protein: 5.5 g/dL — ABNORMAL LOW (ref 6.5–8.1)

## 2019-02-25 LAB — MAGNESIUM: Magnesium: 2 mg/dL (ref 1.7–2.4)

## 2019-02-25 LAB — PHOSPHORUS: Phosphorus: 2.2 mg/dL — ABNORMAL LOW (ref 2.5–4.6)

## 2019-02-25 LAB — GLUCOSE, CAPILLARY
Glucose-Capillary: 181 mg/dL — ABNORMAL HIGH (ref 70–99)
Glucose-Capillary: 197 mg/dL — ABNORMAL HIGH (ref 70–99)

## 2019-02-25 LAB — D-DIMER, QUANTITATIVE: D-Dimer, Quant: 1.05 ug/mL-FEU — ABNORMAL HIGH (ref 0.00–0.50)

## 2019-02-25 MED ORDER — GUAIFENESIN-DM 100-10 MG/5ML PO SYRP: 10.0000 mL | ORAL_SOLUTION | ORAL | 0 refills | Status: DC | PRN

## 2019-02-25 MED ORDER — ATORVASTATIN CALCIUM 20 MG PO TABS: 20.0000 mg | ORAL_TABLET | Freq: Every day | ORAL | Status: AC

## 2019-02-25 MED ORDER — ASCORBIC ACID 500 MG PO TABS: 500.0000 mg | ORAL_TABLET | Freq: Every day | ORAL | Status: DC

## 2019-02-25 MED ORDER — METOPROLOL TARTRATE 25 MG PO TABS: 12.5000 mg | ORAL_TABLET | Freq: Two times a day (BID) | ORAL | 3 refills | Status: DC

## 2019-02-25 MED ORDER — POTASSIUM & SODIUM PHOSPHATES 280-160-250 MG PO PACK: 1.0000 | PACK | Freq: Three times a day (TID) | ORAL | 0 refills | Status: AC

## 2019-02-25 MED ORDER — ENSURE ENLIVE PO LIQD: 237.0000 mL | Freq: Three times a day (TID) | ORAL | 12 refills | Status: DC

## 2019-02-25 MED ORDER — SENNA 8.6 MG PO TABS: 1.0000 | ORAL_TABLET | Freq: Two times a day (BID) | ORAL | 0 refills | Status: DC

## 2019-02-25 MED ORDER — ZINC SULFATE 220 (50 ZN) MG PO CAPS: 220.0000 mg | ORAL_CAPSULE | Freq: Every day | ORAL | 0 refills | Status: DC

## 2019-02-25 MED ORDER — ALBUTEROL SULFATE HFA 108 (90 BASE) MCG/ACT IN AERS: 2.0000 | INHALATION_SPRAY | Freq: Four times a day (QID) | RESPIRATORY_TRACT | 0 refills | Status: DC

## 2019-02-25 MED ORDER — DEXAMETHASONE 6 MG PO TABS: 6.0000 mg | ORAL_TABLET | ORAL | 0 refills | Status: AC

## 2019-02-25 NOTE — TOC Progression Note (Signed)
Transition of Care [facility]) - Progression Note    Patient Details  Name: Jose Higgins MRN: [id number] Date of Birth: [birth year]  Transition of Care [facility]) CM/SW [place], LCSW Phone Number: 02/25/2019, 3:29 PM  Clinical Narrative:    CSW notified that patient is requesting to return home to [place] rather than SNF. PT to re-evaluate patient. If patient does return home, after 14 days [facility] can send therapy to his home if the MD places an order for home health.   Expected Discharge Plan: [place] Barriers to Discharge: Continued Medical Work up  Expected Discharge Plan and Services Expected Discharge Plan: [place] In-house Referral: Clinical Social Work Discharge Planning Services: CM Consult Post Acute Care Choice: [place] Living arrangements for the past 2 months: [place] Expected Discharge Date: 02/25/19                                     Social Determinants of Health (SDOH) Interventions    Readmission Risk Interventions No flowsheet data found.

## 2019-02-25 NOTE — Discharge Summary (Signed)
Physician Discharge Summary  Jose Higgins SWF:[id number] DOB: March 20, 1940 DOA: 02/21/2019  PCP: System, Provider Not In  Admit date: 02/21/2019 Discharge date: 02/25/2019  Admitted From: [place].  Disposition:  [facility]  Recommendations for Outpatient Follow-up:  1. Follow up with PCP in 1-2 weeks 2. Please obtain BMP/CBC in one week 3. Please follow up a repeat CXR in 2 weeks     Discharge Condition:stable.  CODE STATUS:full code.  Diet recommendation: Heart Healthy    Brief/Interim Summary: 79 year old gentleman with prior h/o hypertension, diabetes mellitus , tested positive for COVID 19 on 02/21/19 presents to ED for fever, cough, generalized weakness and initially required 2l it of [place] oxygen and ataxia for 3 to 4 days prior to admission.  CXR consistent with viral pneumonia. He was admitted to [facility] but transferred to [facility] for MRI brain.  MRI brain does not show any acute stroke. PT/OT eval recommending SNF.   Discharge Diagnoses:  Active Problems:   Respiratory tract infection due to COVID-19 virus  Acute respiratory failure with hypoxia secondary to COVID 19 viral pneumonia:  -weaned off oxygen and completed the course  of  Remdesivir. Continue with decadron to complete the course and IS, flutter valve, and anti tussives, inhalers.  - CRP is 8.8, Ferritin 525, improving d dimer to 1.25.    Ataxia:  - MRI ruled out acute stroke.  Recommend PT/OT evaluation-- to SNF for continued rehab.    Hypertension:  Well controlled.  Check orthostatic vital signs.    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  CBG (last 3)  Recent Labs    02/24/19 1723 02/24/19 2132 02/25/19 0805  GLUCAP 152* 178* 181*    Resume home meds on discharge.     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    Sinus bradycardia:  -decreased the dose of metoprolol from 50 mg BID to 12.5 mg BID.     Discharge Instructions  Discharge Instructions    Diet - low sodium heart healthy   Complete by: As directed    Diet - low sodium heart healthy   Complete by: As directed    Discharge instructions   Complete by: As directed    FOLLOW up with PCP in 1 to 2 weeks.   Increase activity slowly   Complete by: As directed      Allergies as of 02/25/2019   No Known Allergies     Medication List    STOP taking these medications   guaiFENesin 600 MG 12 hr tablet Commonly known as: MUCINEX     TAKE these medications   acetaminophen 325 MG tablet Commonly known as: TYLENOL Take 650 mg by mouth every 6 (six) hours as needed for moderate pain or fever.   albuterol 108 (90 Base) MCG/ACT inhaler Commonly known as: VENTOLIN HFA Inhale 2 puffs into the lungs every 6 (six) hours.   alfuzosin 10 MG 24 hr tablet Commonly known as: UROXATRAL Take 10 mg by mouth daily with breakfast.   ascorbic acid 500 MG tablet Commonly known as: VITAMIN C Take 1 tablet (500 mg total) by mouth daily. Start taking on: February 26, 2019   aspirin EC 81 MG tablet Take 81 mg by mouth daily.   atorvastatin 20 MG tablet Commonly known as: LIPITOR Take 1 tablet (20 mg total) by mouth daily.   cetirizine 10 MG tablet Commonly known as: ZYRTEC Take 10 mg by mouth  daily.   chlorproMAZINE 25 MG tablet Commonly known as: THORAZINE Take 1 tablet (25 mg total) by mouth 3 (three) times daily as needed for hiccoughs.   dexamethasone 6 MG tablet Commonly known as: DECADRON Take 1 tablet (6 mg total) by mouth daily for 6 days. Start taking on: February 26, 2019   feeding supplement (ENSURE ENLIVE) Liqd Take 237 mLs by mouth 3 (three) times daily between meals.   guaiFENesin-dextromethorphan 100-10 MG/5ML syrup Commonly known as: ROBITUSSIN DM Take 10 mLs by mouth every 4 (four) hours as needed for cough.   ipratropium 0.06 % nasal spray Commonly known as: ATROVENT Place 2 sprays into both nostrils  3 (three) times daily as needed for rhinitis.   irbesartan 150 MG tablet Commonly known as: AVAPRO Take 150 mg by mouth daily.   metoprolol tartrate 25 MG tablet Commonly known as: LOPRESSOR Take 0.5 tablets (12.5 mg total) by mouth 2 (two) times daily. What changed:   medication strength  how much to take   ondansetron 4 MG disintegrating tablet Commonly known as: Zofran ODT Take 1 tablet (4 mg total) by mouth every 8 (eight) hours as needed for nausea or vomiting.   potassium & sodium phosphates 280-160-250 MG Pack Commonly known as: PHOS-NAK Take 1 packet by mouth 4 (four) times daily -  with meals and at bedtime for 5 days.   senna 8.6 MG Tabs tablet Commonly known as: SENOKOT Take 1 tablet (8.6 mg total) by mouth 2 (two) times daily.   sitaGLIPtin-metformin 50-1000 MG tablet Commonly known as: JANUMET Take 1 tablet by mouth 2 (two) times daily with a meal.   zinc sulfate 220 (50 Zn) MG capsule Take 1 capsule (220 mg total) by mouth daily. Start taking on: February 26, 2019       Contact information for follow-up providers    primary care physician. Schedule an appointment as soon as possible for a visit in 2 week(s).            Contact information for after-discharge care    Destination    [facility] .   Service: Skilled Nursing Contact information: [place] [phone]                 No Known Allergies  Consultations:  Curb side with Dr Rory Percy regarding the MRI brain.    Procedures/Studies: CT HEAD WO CONTRAST  Result Date: 02/22/2019 CLINICAL DATA:  Ataxia.  Rule out stroke EXAM: CT HEAD WITHOUT CONTRAST TECHNIQUE: Contiguous axial images were obtained from the base of the skull through the vertex without intravenous contrast. COMPARISON:  None. FINDINGS: Brain: Mild cortical volume loss. No hydrocephalus. Negative for infarct, hemorrhage, mass. Vascular: Negative for  hyperdense vessel. Skull: Negative Sinuses/Orbits: Negative Other: None IMPRESSION: No acute abnormality. Electronically Signed   By: Franchot Gallo M.D.   On: 02/22/2019 14:43   MR BRAIN WO CONTRAST  Result Date: 02/23/2019 CLINICAL DATA:  Ataxia.  Coronavirus infection.  Negative head CT. EXAM: MRI HEAD WITHOUT CONTRAST TECHNIQUE: Multiplanar, multiecho pulse sequences of the brain and surrounding structures were obtained without intravenous contrast. COMPARISON:  Head CT yesterday. FINDINGS: Brain: Diffusion imaging does not show any acute or subacute infarction. The brainstem is normal. Minimal small vessel change affects the cerebral hemispheric white matter, less than often seen at this age. No large vessel infarction. No mass lesion, hemorrhage, hydrocephalus or extra-axial collection. Vascular: Major vessels at the base of the brain  show flow. Skull and upper cervical spine: Negative Sinuses/Orbits: Clear/normal Other: None IMPRESSION: No acute finding. Essentially normal study for age. Very few punctate foci of T2 and FLAIR signal affecting the hemispheric white matter, less than often seen at this age. Electronically Signed   By: Nelson Chimes M.D.   On: 02/23/2019 19:06   DG Chest Port 1 View  Result Date: 02/21/2019 CLINICAL DATA:  Shortness of breath EXAM: PORTABLE CHEST 1 VIEW COMPARISON:  02/21/2019 FINDINGS: Patchy airspace opacity in the left mid to lower lung. No pleural effusion. Normal heart size. No pneumothorax. IMPRESSION: Vague patchy opacity in the left mid to lower lung may reflect mild pneumonia Electronically Signed   By: Donavan Foil M.D.   On: 02/21/2019 18:40   DG Chest Port 1 View  Result Date: 02/21/2019 CLINICAL DATA:  Fever and recent COVID exposure EXAM: PORTABLE CHEST 1 VIEW COMPARISON:  None. FINDINGS: The heart size and mediastinal contours are within normal limits. Both lungs are clear. The visualized skeletal structures are unremarkable. IMPRESSION: No active  disease. Electronically Signed   By: Ulyses Jarred M.D.   On: 02/21/2019 01:53       Subjective: No new complaints.   Discharge Exam: Vitals:   02/25/19 0600 02/25/19 0636  BP:    Pulse: (!) 40 (!) 50  Resp: 20 16  Temp:    SpO2: 97% 99%   Vitals:   02/25/19 0400 02/25/19 0500 02/25/19 0600 02/25/19 0636  BP: 118/85     Pulse: 62 (!) 45 (!) 40 (!) 50  Resp: 17 20 20 16   Temp: 97.7 F (36.5 C)     TempSrc: Oral     SpO2: 98% 100% 97% 99%  Weight: 92 kg     Height:        General: Pt is alert, awake, not in acute distress Cardiovascular: RRR, S1/S2 +, no rubs, no gallops Respiratory: CTA bilaterally, no wheezing, no rhonchi Abdominal: Soft, NT, ND, bowel sounds + Extremities: no edema, no cyanosis    The results of significant diagnostics from this hospitalization (including imaging, microbiology, ancillary and laboratory) are listed below for reference.     Microbiology: Recent Results (from the past 240 hour(s))  Blood Culture (routine x 2)     Status: None (Preliminary result)   Collection Time: 02/21/19  1:00 AM   Specimen: BLOOD LEFT FOREARM  Result Value Ref Range Status   Specimen Description   Final    BLOOD LEFT FOREARM Performed at [facility], [address] [place], [place] [address]    Special Requests   Final    BOTTLES DRAWN AEROBIC AND ANAEROBIC Blood Culture adequate volume Performed at [facility], [address] [place], [place] [address]    Culture   Final    NO GROWTH 4 DAYS Performed at [facility], [place] [address] [place], [place] [address]    Report Status PENDING  Incomplete  SARS Coronavirus 2 Ag (30 min TAT) - Nasal Swab (BD Veritor Kit)     Status: Abnormal   Collection Time: 02/21/19  1:09 AM   Specimen: Nasal Swab (BD Veritor Kit)  Result Value Ref Range Status   SARS Coronavirus 2 Ag POSITIVE (A) NEGATIVE Final    Comment: RESULT CALLED TO, READ BACK BY AND VERIFIED WITH: MAYNARD,C,RN @  0153 02/21/19 BY GWYN,P (NOTE) SARS-CoV-2 antigen PRESENT. Positive results indicate the presence of viral antigens, but clinical correlation with patient history  and other diagnostic information is necessary to determine patient infection status.  Positive results do not rule out bacterial infection or co-infection  with other viruses. False positive results are rare but can occur, and confirmatory RT-PCR testing may be appropriate in some circumstances. The expected result is Negative. Fact Sheet for Patients: PodPark.tn Fact Sheet for Providers: GiftContent.is  This test is not yet approved or cleared by the [place] FDA and  has been authorized for detection and/or diagnosis of SARS-CoV-2 by FDA under an Emergency Use Authorization (EUA).  This EUA will remain in effect (meaning this test can be used) for the duration of  the COVID-19 decla ration under Section 564(b)(1) of the Act, 21 U.S.C. section 360bbb-3(b)(1), unless the authorization is terminated or revoked sooner. Performed at [facility], [address] [place], [place] [address]   Blood Culture (routine x 2)     Status: None (Preliminary result)   Collection Time: 02/21/19  1:10 AM   Specimen: BLOOD LEFT ARM  Result Value Ref Range Status   Specimen Description   Final    BLOOD LEFT ARM Performed at [facility], [address] [place], [place] [address]    Special Requests   Final    BOTTLES DRAWN AEROBIC AND ANAEROBIC Blood Culture adequate volume Performed at [facility], [address] [place], [place] [address]    Culture   Final    NO GROWTH 4 DAYS Performed at [facility], [place] [address], [place], [place] [address]    Report Status PENDING  Incomplete     Labs: BNP (last 3 results) Recent Labs    02/22/19 0538  BNP 503.5*   Basic Metabolic Panel: Recent Labs  Lab 02/21/19 1957  02/22/19 0538 02/23/19 0340 02/24/19 0243 02/25/19 0724  NA 128* 130* 130* 134* 134*  K 3.6 3.5 3.4* 3.4* 3.9  CL 96* 97* 98 104 108  CO2 22 22 23 23 23   GLUCOSE 205* 200* 180* 199* 176*  BUN 15 13 21 20 16   CREATININE 1.06 1.04 1.09 0.93 0.99  CALCIUM 7.9* 7.3* 7.3* 7.8* 7.9*  MG  --  1.6* 2.1 2.2 2.0  PHOS  --  1.4* 2.0* 1.9* 2.2*   Liver Function Tests: Recent Labs  Lab 02/21/19 0109 02/22/19 0538 02/23/19 0340 02/24/19 0243 02/25/19 0724  AST 25 34 40 34 32  ALT 21 24 25 30  33  ALKPHOS 38 32* 28* 28* 37*  BILITOT 2.0* 1.3* 1.4* 1.1 1.5*  PROT 6.5 6.2* 5.7* 5.5* 5.5*  ALBUMIN 3.2* 2.8* 2.6* 2.3* 2.4*   No results for input(s): LIPASE, AMYLASE in the last 168 hours. No results for input(s): AMMONIA in the last 168 hours. CBC: Recent Labs  Lab 02/21/19 0109 02/22/19 0538 02/23/19 0340 02/24/19 0243 02/25/19 0724  WBC 5.9 5.3 6.6 6.1 8.3  NEUTROABS 4.8 4.7 5.2 4.7 6.7  HGB 12.4* 11.8* 11.5* 11.5* 12.1*  HCT 35.7* 34.1* 33.1* 32.5* 34.5*  MCV 91.1 89.7 90.4 88.1 90.6  PLT 130* 121* 147* 174 191   Cardiac Enzymes: No results for input(s): CKTOTAL, CKMB, CKMBINDEX, TROPONINI in the last 168 hours. BNP: Invalid input(s): POCBNP CBG: Recent Labs  Lab 02/24/19 0756 02/24/19 1243 02/24/19 1723 02/24/19 2132 02/25/19 0805  GLUCAP 177* 234* 152* 178* 181*   D-Dimer Recent Labs    02/24/19 0243 02/25/19 0724  DDIMER 1.25* 1.05*   Hgb A1c No results for input(s): HGBA1C in  the last 72 hours. Lipid Profile No results for input(s): CHOL, HDL, LDLCALC, TRIG, CHOLHDL, LDLDIRECT in the last 72 hours. Thyroid function studies No results for input(s): TSH, T4TOTAL, T3FREE, THYROIDAB in the last 72 hours.  Invalid input(s): FREET3 Anemia work up Recent Labs    02/24/19 0243  FERRITIN 525*   Urinalysis    Component Value Date/Time   COLORURINE YELLOW 02/22/2019 [place] 02/22/2019 0525   LABSPEC 1.023 02/22/2019 0525   PHURINE 5.0  02/22/2019 0525   GLUCOSEU >=500 (A) 02/22/2019 0525   HGBUR MODERATE (A) 02/22/2019 0525   BILIRUBINUR NEGATIVE 02/22/2019 0525   KETONESUR 80 (A) 02/22/2019 0525   PROTEINUR 100 (A) 02/22/2019 0525   NITRITE NEGATIVE 02/22/2019 0525   LEUKOCYTESUR NEGATIVE 02/22/2019 0525   Sepsis Labs Invalid input(s): PROCALCITONIN,  WBC,  LACTICIDVEN Microbiology Recent Results (from the past 240 hour(s))  Blood Culture (routine x 2)     Status: None (Preliminary result)   Collection Time: 02/21/19  1:00 AM   Specimen: BLOOD LEFT FOREARM  Result Value Ref Range Status   Specimen Description   Final    BLOOD LEFT FOREARM Performed at [facility], [address] [place], [place] [address]    Special Requests   Final    BOTTLES DRAWN AEROBIC AND ANAEROBIC Blood Culture adequate volume Performed at [facility], [address] [place], [place] [address]    Culture   Final    NO GROWTH 4 DAYS Performed at [facility], [place] [address] [place], [place] [address]    Report Status PENDING  Incomplete  SARS Coronavirus 2 Ag (30 min TAT) - Nasal Swab (BD Veritor Kit)     Status: Abnormal   Collection Time: 02/21/19  1:09 AM   Specimen: Nasal Swab (BD Veritor Kit)  Result Value Ref Range Status   SARS Coronavirus 2 Ag POSITIVE (A) NEGATIVE Final    Comment: RESULT CALLED TO, READ BACK BY AND VERIFIED WITH: MAYNARD,C,RN @ 0153 02/21/19 BY GWYN,P (NOTE) SARS-CoV-2 antigen PRESENT. Positive results indicate the presence of viral antigens, but clinical correlation with patient history and other diagnostic information is necessary to determine patient infection status.  Positive results do not rule out bacterial infection or co-infection  with other viruses. False positive results are rare but can occur, and confirmatory RT-PCR testing may be appropriate in some circumstances. The expected result is Negative. Fact Sheet for Patients:  PodPark.tn Fact Sheet for Providers: GiftContent.is  This test is not yet approved or cleared by the [place] FDA and  has been authorized for detection and/or diagnosis of SARS-CoV-2 by FDA under an Emergency Use Authorization (EUA).  This EUA will remain in effect (meaning this test can be used) for the duration of  the COVID-19 decla ration under Section 564(b)(1) of the Act, 21 U.S.C. section 360bbb-3(b)(1), unless the authorization is terminated or revoked sooner. Performed at [facility], [address] [place], [place] [address]   Blood Culture (routine x 2)     Status: None (Preliminary result)   Collection Time: 02/21/19  1:10 AM   Specimen: BLOOD LEFT ARM  Result Value Ref Range Status   Specimen Description   Final    BLOOD LEFT ARM Performed at [facility], [address] [place], [place] [address]    Special Requests   Final    BOTTLES DRAWN AEROBIC AND ANAEROBIC Blood Culture adequate  volume Performed at [facility], [address], [place], [place] [address]    Culture   Final    NO GROWTH 4 DAYS Performed at [facility], [place] [address] [place], [place] [address]    Report Status PENDING  Incomplete     Time coordinating discharge: 35 minutes  SIGNED:   Hosie Poisson, MD  [facility] 02/25/2019, 11:41 AM

## 2019-02-25 NOTE — TOC Progression Note (Signed)
Transition of Care [facility]) - Progression Note    Patient Details  Name: Jose Higgins MRN: [id number] Date of Birth: 30-May-1940  Transition of Care [facility]) CM/SW [place], LCSW Phone Number: 02/25/2019, 10:09 AM  Clinical Narrative:    Insurance approval received for [facility]: [phone]. PTAR authorization: [id number].   Expected Discharge Plan: [place] Barriers to Discharge: Continued Medical Work up  Expected Discharge Plan and Services Expected Discharge Plan: [place] In-house Referral: Clinical Social Work Discharge Planning Services: CM Consult Post Acute Care Choice: [place] Living arrangements for the past 2 months: [place] Expected Discharge Date: 02/25/19                                     Social Determinants of Health (SDOH) Interventions    Readmission Risk Interventions No flowsheet data found.

## 2019-02-25 NOTE — Progress Notes (Signed)
Physical Therapy Treatment Patient Details Name: Jose Higgins MRN: [id number] DOB: [birth year] Today's Date: 02/25/2019    History of Present Illness 79 y.o. male admitted on 02/21/19 with N/V, myalgias, cough, SOB.  Pt dx with acute hypoxic respiratory failure due to COVID 19 viral illness.  Pt also with sudden, significant imbalance and ataxia.  CT scan negative for acute stroke.  MRI also negative.  Pt with other significant PMH of HTN, DM.      PT Comments    Patient progressing with mobility, but still somewhat unsteady on his feet.  Berg balance assessment performed demonstrating high fall risk with score 41/56 (less than 45 indicates high fall risk.)  However, pt refusing to use any assistive devices which means he would have to rely on close supervision from his wife if he decides to return home.  He can get HHPT in the home as well, but seems also appropriate for STSNF rehab if he agrees.  Reviewed fall prevention information with patient as well.  PT to follow acutely.    Follow Up Recommendations  Home health PT;SNF;Supervision/Assistance - 24 hour(appropriate for STSNF stay, but if pt prefers home has available help from wife and can get HHPT)     Equipment Recommendations  3in1 (PT);Rolling walker with 5" wheels(patient refusing any assistive devices)    Recommendations for Other Services       Precautions / Restrictions Precautions Precautions: Fall    Mobility  Bed Mobility               General bed mobility comments: up in recliner  Transfers   Equipment used: None Transfers: Sit to/from Stand Sit to Stand: Supervision         General transfer comment: mild imbalance initially standing, bracing with backs of his legs against chair to gain balance  Ambulation/Gait Ambulation/Gait assistance: Supervision;Min guard Gait Distance (Feet): 200 Feet Assistive device: None Gait Pattern/deviations: Step-through pattern;Drifts right/left;Wide base of support     General Gait Details: performed head turns with ambulation withsome veering to sides, no AD and wide BOS throughout, minguard at times due to veering   Stairs             Wheelchair Mobility    Modified Rankin (Stroke Patients Only)       Balance Overall balance assessment: Needs assistance   Sitting balance-Leahy Scale: Good       Standing balance-Leahy Scale: Fair                   Standardized Balance Assessment Standardized Balance Assessment : Berg Balance Test Berg Balance Test Sit to Stand: Able to stand without using hands and stabilize independently Standing Unsupported: Able to stand safely 2 minutes Sitting with Back Unsupported but Feet Supported on Floor or Stool: Able to sit safely and securely 2 minutes Stand to Sit: Controls descent by using hands Transfers: Able to transfer safely, definite need of hands Standing Unsupported with Eyes Closed: Able to stand 10 seconds with supervision Standing Ubsupported with Feet Together: Able to place feet together independently and stand for 1 minute with supervision From Standing, Reach Forward with Outstretched Arm: Can reach confidently >25 cm (10") From Standing Position, Pick up Object from Floor: Able to pick up shoe, needs supervision From Standing Position, Turn to Look Behind Over each Shoulder: Looks behind one side only/other side shows less weight shift Turn 360 Degrees: Able to turn 360 degrees safely but slowly Standing Unsupported, Alternately Place Feet on Step/Stool:  Able to complete >2 steps/needs minimal assist Standing Unsupported, One Foot in Front: Able to plae foot ahead of the other independently and hold 30 seconds Standing on One Leg: Tries to lift leg/unable to hold 3 seconds but remains standing independently Total Score: 41        Cognition Arousal/Alertness: Awake/alert Behavior During Therapy: WFL for tasks assessed/performed Overall Cognitive Status: No  family/caregiver present to determine baseline cognitive functioning Area of Impairment: Safety/judgement                         Safety/Judgement: Decreased awareness of safety;Decreased awareness of deficits     General Comments: stubbornly refuses any assistive device, though uses walls and furniture at times for balance      Exercises      General Comments General comments (skin integrity, edema, etc.): Educated in fall risk and need for fall prevention including foowear, lighting, clear pathways, slow to rise, nonskid surface in shower and keeping items frequently used at counter level      Pertinent Vitals/Pain Pain Assessment: No/denies pain    Home Living                      Prior Function            PT Goals (current goals can now be found in the care plan section) Progress towards PT goals: Progressing toward goals    Frequency    Min 4X/week      PT Plan Discharge plan needs to be updated    Co-evaluation              AM-PAC PT "6 Clicks" Mobility   Outcome Measure  Help needed turning from your back to your side while in a flat bed without using bedrails?: None Help needed moving from lying on your back to sitting on the side of a flat bed without using bedrails?: None Help needed moving to and from a bed to a chair (including a wheelchair)?: None Help needed standing up from a chair using your arms (e.g., wheelchair or bedside chair)?: None Help needed to walk in hospital room?: A Little Help needed climbing 3-5 steps with a railing? : A Little 6 Click Score: 22    End of Session   Activity Tolerance: Patient tolerated treatment well Patient left: in chair;with call bell/phone within reach   PT Visit Diagnosis: Muscle weakness (generalized) (M62.81);Other symptoms and signs involving the nervous system (R29.898);Other abnormalities of gait and mobility (R26.89)     Time: 1600-1620 PT Time Calculation (min) (ACUTE  ONLY): 20 min  Charges:  $Gait Training: 8-22 mins                     Fisher [phone] 02/25/2019    Reginia Naas 02/25/2019, 4:29 PM

## 2019-02-25 NOTE — Progress Notes (Signed)
   02/25/19 1712  AVS Discharge Documentation  AVS Discharge Instructions Including Medications Provided to patient/caregiver  Name of Person Receiving AVS Discharge Instructions Including Medications Jose Higgins  Name of Clinician That Reviewed AVS Discharge Instructions Including Medications Venida Jarvis, RN

## 2019-02-25 NOTE — TOC Transition Note (Deleted)
Transition of Care [facility]) - CM/SW Discharge Note   Patient Details  Name: Jose Higgins MRN: [id number] Date of Birth: 1940-06-01  Transition of Care [facility]) CM/SW Contact:  Benard Halsted, LCSW Phone Number: 02/25/2019, 4:56 PM   Clinical Narrative:    CSW spoke with patient post-PT session. Patient reports that he feels at his baseline and would like to return home with his wife. CSW contacted patient's spouse. She reported that she is comfortable bringing patient back home. She states her grandson is here assisting them as needed and she knows patient wants to return home. She is in agreement with a rolling walker and 3in1. [employer] to deliver them to [place] prior to patient discharging. Grandson to pick patient up after that. Home Health orders faxed to [facility] so they can set up care for him.    Final next level of care: Skilled Nursing Facility([facility]) Barriers to Discharge: Continued Medical Work up   Patient Goals and CMS Choice Patient states their goals for this hospitalization and ongoing recovery are:: To get COVID + CMS Medicare.gov Compare Post Acute Care list provided to:: Patient Choice offered to / list presented to : Patient  Discharge Placement                       Discharge Plan and Services In-house Referral: Clinical Social Work Discharge Planning Services: CM Consult Post Acute Care Choice: Skilled Nursing Facility                               Social Determinants of Health (SDOH) Interventions     Readmission Risk Interventions No flowsheet data found.

## 2019-02-25 NOTE — TOC Transition Note (Signed)
Transition of Care [facility]) - CM/SW Discharge Note   Patient Details  Name: Jose Higgins MRN: [id number] Date of Birth: 1940/05/31  Transition of Care [facility]) CM/SW Contact:  Benard Halsted, LCSW Phone Number: 02/25/2019, 5:12 PM   Clinical Narrative:    CSW spoke with patient post-PT session. Patient reports that he feels at his baseline and would like to return home with his wife. CSW contacted patient's spouse. She reported that she is comfortable bringing patient back home. She states her grandson is here assisting them as needed and she knows patient wants to return home. She is in agreement with a rolling walker and 3in1. [employer] to deliver them to [place] prior to patient discharging. Grandson to pick patient up after that. Home Health orders faxed to [facility] so they can set up care for him.    Final next level of care: [place] Barriers to Discharge: No Barriers Identified   Patient Goals and CMS Choice Patient states their goals for this hospitalization and ongoing recovery are:: To get COVID + CMS Medicare.gov Compare Post Acute Care list provided to:: Patient Choice offered to / list presented to : Patient  Discharge Placement                Patient to be transferred to facility by: Car Name of family member notified: Spouse    Discharge Plan and Services In-house Referral: Clinical Social Work Discharge Planning Services: CM Consult Post Acute Care Choice: [place]          DME Arranged: 3-N-1, Walker rolling DME Agency: [employer] Date DME Agency Contacted: 02/25/19 Time DME Agency Contacted: 1700 Representative spoke with at DME Agency: [place] ([place] Interventions     Readmission Risk Interventions No flowsheet data found.

## 2019-02-25 NOTE — TOC Progression Note (Addendum)
Transition of Care [facility]) - Progression Note    Patient Details  Name: Jose Higgins MRN: [id number] Date of Birth: 1940-11-25  Transition of Care [facility]) CM/SW [place] Work Phone Number: 02/25/2019, 11:13 AM  Clinical Narrative:    MSW was informed that pt's dtr wanted information regarding pt's discharge planning. MSW updated daughter on current discharging plans and all questions were answered. MSW stated where the pt was going for rehab [facility]) which is where the patient is from. MSW informed dtr that we would transport pt by [facility] ambulance. Will continue to follow discharge needs.   Expected Discharge Plan: [place] Barriers to Discharge: Continued Medical Work up  Expected Discharge Plan and Services Expected Discharge Plan: [place] In-house Referral: Clinical Social Work Discharge Planning Services: CM Consult Post Acute Care Choice: [place] arrangements for the past 2 months: [place] Expected Discharge Date: 02/25/19                                     Social Determinants of Health (SDOH) Interventions    Readmission Risk Interventions No flowsheet data found.

## 2019-02-25 NOTE — Progress Notes (Signed)
Notified Dr. Criss Alvine about patient's HR being in 75's after being given dose of metoprolol at hs. Stated OK to hold metoprolol until addressed by the provider team.

## 2019-02-25 NOTE — Care Management Important Message (Signed)
Important Message  Patient Details  Name: Jose Higgins MRN: [id number] Date of Birth: 10/24/1940   Medicare Important Message Given:  Yes - Important Message mailed due to current National Emergency  Verbal consent obtained due to current National Emergency  Relationship to patient: Spouse/Significant Other Contact Name: Kaysen Flora Call Date: 02/25/19  Time: 1259 Phone: [phone] Outcome: Spoke with contact Important Message mailed to: Patient address on file    Delorse Lek 02/25/2019, 12:59 PM

## 2019-02-26 LAB — CULTURE, BLOOD (ROUTINE X 2)
Culture: NO GROWTH
Culture: NO GROWTH
Special Requests: ADEQUATE
Special Requests: ADEQUATE

## 2019-03-08 DIAGNOSIS — I10 Essential (primary) hypertension: Secondary | ICD-10-CM | POA: Diagnosis not present

## 2019-03-08 DIAGNOSIS — E1140 Type 2 diabetes mellitus with diabetic neuropathy, unspecified: Secondary | ICD-10-CM | POA: Diagnosis not present

## 2019-03-08 DIAGNOSIS — E785 Hyperlipidemia, unspecified: Secondary | ICD-10-CM | POA: Diagnosis not present

## 2019-03-30 DIAGNOSIS — R918 Other nonspecific abnormal finding of lung field: Secondary | ICD-10-CM | POA: Diagnosis not present

## 2019-03-30 DIAGNOSIS — B356 Tinea cruris: Secondary | ICD-10-CM | POA: Diagnosis not present

## 2019-03-30 DIAGNOSIS — I10 Essential (primary) hypertension: Secondary | ICD-10-CM | POA: Diagnosis not present

## 2019-03-30 DIAGNOSIS — B999 Unspecified infectious disease: Secondary | ICD-10-CM | POA: Diagnosis not present

## 2019-03-30 DIAGNOSIS — U071 COVID-19: Secondary | ICD-10-CM | POA: Diagnosis not present

## 2019-04-07 DIAGNOSIS — L299 Pruritus, unspecified: Secondary | ICD-10-CM | POA: Diagnosis not present

## 2019-04-07 DIAGNOSIS — Z23 Encounter for immunization: Secondary | ICD-10-CM | POA: Diagnosis not present

## 2019-05-17 DIAGNOSIS — E1140 Type 2 diabetes mellitus with diabetic neuropathy, unspecified: Secondary | ICD-10-CM | POA: Diagnosis not present

## 2019-05-17 DIAGNOSIS — I10 Essential (primary) hypertension: Secondary | ICD-10-CM | POA: Diagnosis not present

## 2019-05-17 DIAGNOSIS — R972 Elevated prostate specific antigen [PSA]: Secondary | ICD-10-CM | POA: Diagnosis not present

## 2019-05-17 DIAGNOSIS — E785 Hyperlipidemia, unspecified: Secondary | ICD-10-CM | POA: Diagnosis not present

## 2019-08-24 DIAGNOSIS — H2513 Age-related nuclear cataract, bilateral: Secondary | ICD-10-CM | POA: Diagnosis not present

## 2019-08-24 DIAGNOSIS — H524 Presbyopia: Secondary | ICD-10-CM | POA: Diagnosis not present

## 2019-08-24 DIAGNOSIS — E119 Type 2 diabetes mellitus without complications: Secondary | ICD-10-CM | POA: Diagnosis not present

## 2019-09-19 DIAGNOSIS — I10 Essential (primary) hypertension: Secondary | ICD-10-CM | POA: Diagnosis not present

## 2019-09-19 DIAGNOSIS — E1140 Type 2 diabetes mellitus with diabetic neuropathy, unspecified: Secondary | ICD-10-CM | POA: Diagnosis not present

## 2019-09-19 DIAGNOSIS — E785 Hyperlipidemia, unspecified: Secondary | ICD-10-CM | POA: Diagnosis not present

## 2019-09-19 DIAGNOSIS — R972 Elevated prostate specific antigen [PSA]: Secondary | ICD-10-CM | POA: Diagnosis not present

## 2019-09-20 DIAGNOSIS — E785 Hyperlipidemia, unspecified: Secondary | ICD-10-CM | POA: Diagnosis not present

## 2019-09-20 DIAGNOSIS — I10 Essential (primary) hypertension: Secondary | ICD-10-CM | POA: Diagnosis not present

## 2019-09-20 DIAGNOSIS — E875 Hyperkalemia: Secondary | ICD-10-CM | POA: Diagnosis not present

## 2019-09-20 DIAGNOSIS — E1140 Type 2 diabetes mellitus with diabetic neuropathy, unspecified: Secondary | ICD-10-CM | POA: Diagnosis not present

## 2019-09-20 DIAGNOSIS — N179 Acute kidney failure, unspecified: Secondary | ICD-10-CM | POA: Diagnosis not present

## 2019-10-17 DIAGNOSIS — N401 Enlarged prostate with lower urinary tract symptoms: Secondary | ICD-10-CM | POA: Diagnosis not present

## 2019-10-17 DIAGNOSIS — R972 Elevated prostate specific antigen [PSA]: Secondary | ICD-10-CM | POA: Diagnosis not present

## 2019-10-17 DIAGNOSIS — N5201 Erectile dysfunction due to arterial insufficiency: Secondary | ICD-10-CM | POA: Diagnosis not present

## 2019-10-17 DIAGNOSIS — R3915 Urgency of urination: Secondary | ICD-10-CM | POA: Diagnosis not present

## 2019-10-17 DIAGNOSIS — Z87442 Personal history of urinary calculi: Secondary | ICD-10-CM | POA: Diagnosis not present

## 2019-10-18 DIAGNOSIS — M25351 Other instability, right hip: Secondary | ICD-10-CM | POA: Diagnosis not present

## 2019-10-18 DIAGNOSIS — M1611 Unilateral primary osteoarthritis, right hip: Secondary | ICD-10-CM | POA: Diagnosis not present

## 2019-12-01 DIAGNOSIS — M160 Bilateral primary osteoarthritis of hip: Secondary | ICD-10-CM | POA: Diagnosis not present

## 2019-12-01 DIAGNOSIS — M1611 Unilateral primary osteoarthritis, right hip: Secondary | ICD-10-CM | POA: Diagnosis not present

## 2019-12-01 DIAGNOSIS — M8588 Other specified disorders of bone density and structure, other site: Secondary | ICD-10-CM | POA: Diagnosis not present

## 2019-12-01 DIAGNOSIS — E1165 Type 2 diabetes mellitus with hyperglycemia: Secondary | ICD-10-CM | POA: Diagnosis not present

## 2019-12-01 DIAGNOSIS — E119 Type 2 diabetes mellitus without complications: Secondary | ICD-10-CM | POA: Diagnosis not present

## 2019-12-01 DIAGNOSIS — Z01818 Encounter for other preprocedural examination: Secondary | ICD-10-CM | POA: Diagnosis not present

## 2019-12-01 DIAGNOSIS — M461 Sacroiliitis, not elsewhere classified: Secondary | ICD-10-CM | POA: Diagnosis not present

## 2019-12-12 DIAGNOSIS — L282 Other prurigo: Secondary | ICD-10-CM | POA: Diagnosis not present

## 2019-12-12 DIAGNOSIS — L309 Dermatitis, unspecified: Secondary | ICD-10-CM | POA: Diagnosis not present

## 2019-12-26 DIAGNOSIS — L814 Other melanin hyperpigmentation: Secondary | ICD-10-CM | POA: Diagnosis not present

## 2019-12-26 DIAGNOSIS — L821 Other seborrheic keratosis: Secondary | ICD-10-CM | POA: Diagnosis not present

## 2019-12-26 DIAGNOSIS — Z85828 Personal history of other malignant neoplasm of skin: Secondary | ICD-10-CM | POA: Diagnosis not present

## 2019-12-26 DIAGNOSIS — L578 Other skin changes due to chronic exposure to nonionizing radiation: Secondary | ICD-10-CM | POA: Diagnosis not present

## 2019-12-26 DIAGNOSIS — D1801 Hemangioma of skin and subcutaneous tissue: Secondary | ICD-10-CM | POA: Diagnosis not present

## 2019-12-26 DIAGNOSIS — L570 Actinic keratosis: Secondary | ICD-10-CM | POA: Diagnosis not present

## 2020-01-05 DIAGNOSIS — I10 Essential (primary) hypertension: Secondary | ICD-10-CM | POA: Diagnosis not present

## 2020-01-05 DIAGNOSIS — Z125 Encounter for screening for malignant neoplasm of prostate: Secondary | ICD-10-CM | POA: Diagnosis not present

## 2020-01-05 DIAGNOSIS — Z23 Encounter for immunization: Secondary | ICD-10-CM | POA: Diagnosis not present

## 2020-01-05 DIAGNOSIS — E119 Type 2 diabetes mellitus without complications: Secondary | ICD-10-CM | POA: Diagnosis not present

## 2020-01-05 DIAGNOSIS — E785 Hyperlipidemia, unspecified: Secondary | ICD-10-CM | POA: Diagnosis not present

## 2020-01-11 DIAGNOSIS — E1140 Type 2 diabetes mellitus with diabetic neuropathy, unspecified: Secondary | ICD-10-CM | POA: Diagnosis not present

## 2020-01-11 DIAGNOSIS — I10 Essential (primary) hypertension: Secondary | ICD-10-CM | POA: Diagnosis not present

## 2020-01-11 DIAGNOSIS — E785 Hyperlipidemia, unspecified: Secondary | ICD-10-CM | POA: Diagnosis not present

## 2020-01-11 DIAGNOSIS — N401 Enlarged prostate with lower urinary tract symptoms: Secondary | ICD-10-CM | POA: Diagnosis not present

## 2020-01-11 DIAGNOSIS — R972 Elevated prostate specific antigen [PSA]: Secondary | ICD-10-CM | POA: Diagnosis not present

## 2020-01-11 DIAGNOSIS — Z0000 Encounter for general adult medical examination without abnormal findings: Secondary | ICD-10-CM | POA: Diagnosis not present

## 2020-01-11 DIAGNOSIS — Z01818 Encounter for other preprocedural examination: Secondary | ICD-10-CM | POA: Diagnosis not present

## 2020-01-16 DIAGNOSIS — R2681 Unsteadiness on feet: Secondary | ICD-10-CM | POA: Diagnosis not present

## 2020-01-16 DIAGNOSIS — E1122 Type 2 diabetes mellitus with diabetic chronic kidney disease: Secondary | ICD-10-CM | POA: Diagnosis not present

## 2020-01-16 DIAGNOSIS — E669 Obesity, unspecified: Secondary | ICD-10-CM | POA: Diagnosis not present

## 2020-01-16 DIAGNOSIS — E785 Hyperlipidemia, unspecified: Secondary | ICD-10-CM | POA: Diagnosis not present

## 2020-01-16 DIAGNOSIS — I129 Hypertensive chronic kidney disease with stage 1 through stage 4 chronic kidney disease, or unspecified chronic kidney disease: Secondary | ICD-10-CM | POA: Diagnosis not present

## 2020-01-16 DIAGNOSIS — M1611 Unilateral primary osteoarthritis, right hip: Secondary | ICD-10-CM | POA: Diagnosis not present

## 2020-01-16 DIAGNOSIS — Z471 Aftercare following joint replacement surgery: Secondary | ICD-10-CM | POA: Diagnosis not present

## 2020-01-16 DIAGNOSIS — N182 Chronic kidney disease, stage 2 (mild): Secondary | ICD-10-CM | POA: Diagnosis not present

## 2020-01-16 DIAGNOSIS — G8918 Other acute postprocedural pain: Secondary | ICD-10-CM | POA: Diagnosis not present

## 2020-01-16 DIAGNOSIS — Z96641 Presence of right artificial hip joint: Secondary | ICD-10-CM | POA: Diagnosis not present

## 2020-01-16 DIAGNOSIS — M25551 Pain in right hip: Secondary | ICD-10-CM | POA: Diagnosis not present

## 2020-01-17 ENCOUNTER — Other Ambulatory Visit: Payer: Self-pay

## 2020-01-17 ENCOUNTER — Emergency Department (HOSPITAL_COMMUNITY)
Admission: EM | Admit: 2020-01-17 | Discharge: 2020-01-17 | Disposition: A | Discharge status: Home / Self Care | Payer: PPO | Attending: Emergency Medicine | Admitting: Emergency Medicine

## 2020-01-17 ENCOUNTER — Encounter (HOSPITAL_COMMUNITY): Payer: Self-pay

## 2020-01-17 DIAGNOSIS — E785 Hyperlipidemia, unspecified: Secondary | ICD-10-CM | POA: Diagnosis not present

## 2020-01-17 DIAGNOSIS — R509 Fever, unspecified: Secondary | ICD-10-CM | POA: Insufficient documentation

## 2020-01-17 DIAGNOSIS — Z20822 Contact with and (suspected) exposure to covid-19: Secondary | ICD-10-CM | POA: Insufficient documentation

## 2020-01-17 DIAGNOSIS — M1611 Unilateral primary osteoarthritis, right hip: Secondary | ICD-10-CM | POA: Diagnosis not present

## 2020-01-17 DIAGNOSIS — I129 Hypertensive chronic kidney disease with stage 1 through stage 4 chronic kidney disease, or unspecified chronic kidney disease: Secondary | ICD-10-CM | POA: Diagnosis not present

## 2020-01-17 DIAGNOSIS — Z5321 Procedure and treatment not carried out due to patient leaving prior to being seen by health care provider: Secondary | ICD-10-CM | POA: Insufficient documentation

## 2020-01-17 DIAGNOSIS — N189 Chronic kidney disease, unspecified: Secondary | ICD-10-CM | POA: Diagnosis not present

## 2020-01-17 LAB — CBC WITH DIFFERENTIAL/PLATELET
Abs Immature Granulocytes: 0.05 10*3/uL (ref 0.00–0.07)
Basophils Absolute: 0 10*3/uL (ref 0.0–0.1)
Basophils Relative: 0 %
Eosinophils Absolute: 0 10*3/uL (ref 0.0–0.5)
Eosinophils Relative: 0 %
HCT: 35 % — ABNORMAL LOW (ref 39.0–52.0)
Hemoglobin: 11.8 g/dL — ABNORMAL LOW (ref 13.0–17.0)
Immature Granulocytes: 1 %
Lymphocytes Relative: 16 %
Lymphs Abs: 1.6 10*3/uL (ref 0.7–4.0)
MCH: 31 pg (ref 26.0–34.0)
MCHC: 33.7 g/dL (ref 30.0–36.0)
MCV: 91.9 fL (ref 80.0–100.0)
Monocytes Absolute: 0.8 10*3/uL (ref 0.1–1.0)
Monocytes Relative: 8 %
Neutro Abs: 8 10*3/uL — ABNORMAL HIGH (ref 1.7–7.7)
Neutrophils Relative %: 75 %
Platelets: 131 10*3/uL — ABNORMAL LOW (ref 150–400)
RBC: 3.81 MIL/uL — ABNORMAL LOW (ref 4.22–5.81)
RDW: 12.3 % (ref 11.5–15.5)
WBC: 10.5 10*3/uL (ref 4.0–10.5)
nRBC: 0 % (ref 0.0–0.2)

## 2020-01-17 LAB — COMPREHENSIVE METABOLIC PANEL
ALT: 20 U/L (ref 0–44)
AST: 36 U/L (ref 15–41)
Albumin: 3.2 g/dL — ABNORMAL LOW (ref 3.5–5.0)
Alkaline Phosphatase: 38 U/L (ref 38–126)
Anion gap: 9 (ref 5–15)
BUN: 15 mg/dL (ref 8–23)
CO2: 20 mmol/L — ABNORMAL LOW (ref 22–32)
Calcium: 8.6 mg/dL — ABNORMAL LOW (ref 8.9–10.3)
Chloride: 99 mmol/L (ref 98–111)
Creatinine, Ser: 1.23 mg/dL (ref 0.61–1.24)
GFR, Estimated: 59 mL/min — ABNORMAL LOW (ref >60)
Glucose, Bld: 282 mg/dL — ABNORMAL HIGH (ref 70–99)
Potassium: 3.6 mmol/L (ref 3.5–5.1)
Sodium: 128 mmol/L — ABNORMAL LOW (ref 135–145)
Total Bilirubin: 2.6 mg/dL — ABNORMAL HIGH (ref 0.3–1.2)
Total Protein: 5.9 g/dL — ABNORMAL LOW (ref 6.5–8.1)

## 2020-01-17 LAB — RESP PANEL BY RT-PCR (FLU A&B, COVID) ARPGX2
Influenza A by PCR: NEGATIVE
Influenza B by PCR: NEGATIVE
SARS Coronavirus 2 by RT PCR: NEGATIVE

## 2020-01-17 LAB — LACTIC ACID, PLASMA: Lactic Acid, Venous: 2.4 mmol/L (ref 0.5–1.9)

## 2020-01-17 NOTE — ED Triage Notes (Signed)
Pt reports that he had a recent hip surgery  yesterday, today he began to have congestion and fevers, denies SOB. Reports fevers at home of 102

## 2020-01-17 NOTE — ED Notes (Signed)
pt's wife states that pt cannot remain sitting in the wheelchair. I offered to move the pt into a more comfortable chair and pt's wife stated that "she should have called EMS so that he could go straight to a room". I explained that that is not how the ED waiting process works and explained that we have long wait times at the moment but are working diligently to have people roomed. Pt's wife stated that "I'm calling my grandson to pick him up and we're leaving".

## 2020-01-23 DIAGNOSIS — Z96641 Presence of right artificial hip joint: Secondary | ICD-10-CM | POA: Diagnosis not present

## 2020-01-23 DIAGNOSIS — M6281 Muscle weakness (generalized): Secondary | ICD-10-CM | POA: Diagnosis not present

## 2020-01-23 DIAGNOSIS — M1611 Unilateral primary osteoarthritis, right hip: Secondary | ICD-10-CM | POA: Diagnosis not present

## 2020-01-23 DIAGNOSIS — R262 Difficulty in walking, not elsewhere classified: Secondary | ICD-10-CM | POA: Diagnosis not present

## 2020-01-23 DIAGNOSIS — Z471 Aftercare following joint replacement surgery: Secondary | ICD-10-CM | POA: Diagnosis not present

## 2020-01-25 DIAGNOSIS — R262 Difficulty in walking, not elsewhere classified: Secondary | ICD-10-CM | POA: Diagnosis not present

## 2020-01-25 DIAGNOSIS — M1611 Unilateral primary osteoarthritis, right hip: Secondary | ICD-10-CM | POA: Diagnosis not present

## 2020-01-25 DIAGNOSIS — Z471 Aftercare following joint replacement surgery: Secondary | ICD-10-CM | POA: Diagnosis not present

## 2020-01-25 DIAGNOSIS — M6281 Muscle weakness (generalized): Secondary | ICD-10-CM | POA: Diagnosis not present

## 2020-01-25 DIAGNOSIS — Z96641 Presence of right artificial hip joint: Secondary | ICD-10-CM | POA: Diagnosis not present

## 2020-01-27 DIAGNOSIS — R262 Difficulty in walking, not elsewhere classified: Secondary | ICD-10-CM | POA: Diagnosis not present

## 2020-01-27 DIAGNOSIS — M6281 Muscle weakness (generalized): Secondary | ICD-10-CM | POA: Diagnosis not present

## 2020-01-27 DIAGNOSIS — Z471 Aftercare following joint replacement surgery: Secondary | ICD-10-CM | POA: Diagnosis not present

## 2020-01-27 DIAGNOSIS — M1611 Unilateral primary osteoarthritis, right hip: Secondary | ICD-10-CM | POA: Diagnosis not present

## 2020-01-27 DIAGNOSIS — Z96641 Presence of right artificial hip joint: Secondary | ICD-10-CM | POA: Diagnosis not present

## 2020-01-30 DIAGNOSIS — R262 Difficulty in walking, not elsewhere classified: Secondary | ICD-10-CM | POA: Diagnosis not present

## 2020-01-30 DIAGNOSIS — Z471 Aftercare following joint replacement surgery: Secondary | ICD-10-CM | POA: Diagnosis not present

## 2020-01-30 DIAGNOSIS — M6281 Muscle weakness (generalized): Secondary | ICD-10-CM | POA: Diagnosis not present

## 2020-01-30 DIAGNOSIS — M1611 Unilateral primary osteoarthritis, right hip: Secondary | ICD-10-CM | POA: Diagnosis not present

## 2020-01-30 DIAGNOSIS — Z96641 Presence of right artificial hip joint: Secondary | ICD-10-CM | POA: Diagnosis not present

## 2020-02-01 DIAGNOSIS — R262 Difficulty in walking, not elsewhere classified: Secondary | ICD-10-CM | POA: Diagnosis not present

## 2020-02-01 DIAGNOSIS — M6281 Muscle weakness (generalized): Secondary | ICD-10-CM | POA: Diagnosis not present

## 2020-02-01 DIAGNOSIS — Z96641 Presence of right artificial hip joint: Secondary | ICD-10-CM | POA: Diagnosis not present

## 2020-02-01 DIAGNOSIS — Z471 Aftercare following joint replacement surgery: Secondary | ICD-10-CM | POA: Diagnosis not present

## 2020-02-01 DIAGNOSIS — M1611 Unilateral primary osteoarthritis, right hip: Secondary | ICD-10-CM | POA: Diagnosis not present

## 2020-02-02 DIAGNOSIS — M1611 Unilateral primary osteoarthritis, right hip: Secondary | ICD-10-CM | POA: Diagnosis not present

## 2020-02-02 DIAGNOSIS — Z471 Aftercare following joint replacement surgery: Secondary | ICD-10-CM | POA: Diagnosis not present

## 2020-02-02 DIAGNOSIS — Z96641 Presence of right artificial hip joint: Secondary | ICD-10-CM | POA: Diagnosis not present

## 2020-02-02 DIAGNOSIS — R262 Difficulty in walking, not elsewhere classified: Secondary | ICD-10-CM | POA: Diagnosis not present

## 2020-02-02 DIAGNOSIS — M6281 Muscle weakness (generalized): Secondary | ICD-10-CM | POA: Diagnosis not present

## 2020-02-06 DIAGNOSIS — Z471 Aftercare following joint replacement surgery: Secondary | ICD-10-CM | POA: Diagnosis not present

## 2020-02-06 DIAGNOSIS — Z96641 Presence of right artificial hip joint: Secondary | ICD-10-CM | POA: Diagnosis not present

## 2020-02-06 DIAGNOSIS — R262 Difficulty in walking, not elsewhere classified: Secondary | ICD-10-CM | POA: Diagnosis not present

## 2020-02-06 DIAGNOSIS — M6281 Muscle weakness (generalized): Secondary | ICD-10-CM | POA: Diagnosis not present

## 2020-02-06 DIAGNOSIS — M1611 Unilateral primary osteoarthritis, right hip: Secondary | ICD-10-CM | POA: Diagnosis not present

## 2020-02-10 DIAGNOSIS — M6281 Muscle weakness (generalized): Secondary | ICD-10-CM | POA: Diagnosis not present

## 2020-02-10 DIAGNOSIS — Z471 Aftercare following joint replacement surgery: Secondary | ICD-10-CM | POA: Diagnosis not present

## 2020-02-10 DIAGNOSIS — Z96641 Presence of right artificial hip joint: Secondary | ICD-10-CM | POA: Diagnosis not present

## 2020-02-10 DIAGNOSIS — M1611 Unilateral primary osteoarthritis, right hip: Secondary | ICD-10-CM | POA: Diagnosis not present

## 2020-02-10 DIAGNOSIS — R262 Difficulty in walking, not elsewhere classified: Secondary | ICD-10-CM | POA: Diagnosis not present

## 2020-02-15 DIAGNOSIS — R262 Difficulty in walking, not elsewhere classified: Secondary | ICD-10-CM | POA: Diagnosis not present

## 2020-02-15 DIAGNOSIS — Z471 Aftercare following joint replacement surgery: Secondary | ICD-10-CM | POA: Diagnosis not present

## 2020-02-15 DIAGNOSIS — M1611 Unilateral primary osteoarthritis, right hip: Secondary | ICD-10-CM | POA: Diagnosis not present

## 2020-02-15 DIAGNOSIS — Z96641 Presence of right artificial hip joint: Secondary | ICD-10-CM | POA: Diagnosis not present

## 2020-02-15 DIAGNOSIS — M6281 Muscle weakness (generalized): Secondary | ICD-10-CM | POA: Diagnosis not present

## 2020-02-17 DIAGNOSIS — R262 Difficulty in walking, not elsewhere classified: Secondary | ICD-10-CM | POA: Diagnosis not present

## 2020-02-17 DIAGNOSIS — M6281 Muscle weakness (generalized): Secondary | ICD-10-CM | POA: Diagnosis not present

## 2020-02-17 DIAGNOSIS — Z96641 Presence of right artificial hip joint: Secondary | ICD-10-CM | POA: Diagnosis not present

## 2020-02-17 DIAGNOSIS — Z471 Aftercare following joint replacement surgery: Secondary | ICD-10-CM | POA: Diagnosis not present

## 2020-02-17 DIAGNOSIS — M1611 Unilateral primary osteoarthritis, right hip: Secondary | ICD-10-CM | POA: Diagnosis not present

## 2020-02-20 DIAGNOSIS — M1611 Unilateral primary osteoarthritis, right hip: Secondary | ICD-10-CM | POA: Diagnosis not present

## 2020-02-20 DIAGNOSIS — Z96641 Presence of right artificial hip joint: Secondary | ICD-10-CM | POA: Diagnosis not present

## 2020-02-20 DIAGNOSIS — M6281 Muscle weakness (generalized): Secondary | ICD-10-CM | POA: Diagnosis not present

## 2020-02-20 DIAGNOSIS — R262 Difficulty in walking, not elsewhere classified: Secondary | ICD-10-CM | POA: Diagnosis not present

## 2020-02-20 DIAGNOSIS — Z471 Aftercare following joint replacement surgery: Secondary | ICD-10-CM | POA: Diagnosis not present

## 2020-02-22 DIAGNOSIS — R262 Difficulty in walking, not elsewhere classified: Secondary | ICD-10-CM | POA: Diagnosis not present

## 2020-02-22 DIAGNOSIS — Z96641 Presence of right artificial hip joint: Secondary | ICD-10-CM | POA: Diagnosis not present

## 2020-02-22 DIAGNOSIS — M6281 Muscle weakness (generalized): Secondary | ICD-10-CM | POA: Diagnosis not present

## 2020-02-22 DIAGNOSIS — M1611 Unilateral primary osteoarthritis, right hip: Secondary | ICD-10-CM | POA: Diagnosis not present

## 2020-02-22 DIAGNOSIS — Z471 Aftercare following joint replacement surgery: Secondary | ICD-10-CM | POA: Diagnosis not present

## 2020-02-24 DIAGNOSIS — R262 Difficulty in walking, not elsewhere classified: Secondary | ICD-10-CM | POA: Diagnosis not present

## 2020-02-24 DIAGNOSIS — M6281 Muscle weakness (generalized): Secondary | ICD-10-CM | POA: Diagnosis not present

## 2020-02-24 DIAGNOSIS — Z471 Aftercare following joint replacement surgery: Secondary | ICD-10-CM | POA: Diagnosis not present

## 2020-02-24 DIAGNOSIS — Z96641 Presence of right artificial hip joint: Secondary | ICD-10-CM | POA: Diagnosis not present

## 2020-02-24 DIAGNOSIS — M1611 Unilateral primary osteoarthritis, right hip: Secondary | ICD-10-CM | POA: Diagnosis not present

## 2020-02-27 DIAGNOSIS — Z96641 Presence of right artificial hip joint: Secondary | ICD-10-CM | POA: Diagnosis not present

## 2020-02-27 DIAGNOSIS — M1611 Unilateral primary osteoarthritis, right hip: Secondary | ICD-10-CM | POA: Diagnosis not present

## 2020-02-27 DIAGNOSIS — Z471 Aftercare following joint replacement surgery: Secondary | ICD-10-CM | POA: Diagnosis not present

## 2020-02-27 DIAGNOSIS — M6281 Muscle weakness (generalized): Secondary | ICD-10-CM | POA: Diagnosis not present

## 2020-02-27 DIAGNOSIS — R262 Difficulty in walking, not elsewhere classified: Secondary | ICD-10-CM | POA: Diagnosis not present

## 2020-02-29 DIAGNOSIS — R262 Difficulty in walking, not elsewhere classified: Secondary | ICD-10-CM | POA: Diagnosis not present

## 2020-02-29 DIAGNOSIS — Z471 Aftercare following joint replacement surgery: Secondary | ICD-10-CM | POA: Diagnosis not present

## 2020-02-29 DIAGNOSIS — M1611 Unilateral primary osteoarthritis, right hip: Secondary | ICD-10-CM | POA: Diagnosis not present

## 2020-02-29 DIAGNOSIS — Z96641 Presence of right artificial hip joint: Secondary | ICD-10-CM | POA: Diagnosis not present

## 2020-02-29 DIAGNOSIS — M6281 Muscle weakness (generalized): Secondary | ICD-10-CM | POA: Diagnosis not present

## 2020-03-02 DIAGNOSIS — M6281 Muscle weakness (generalized): Secondary | ICD-10-CM | POA: Diagnosis not present

## 2020-03-02 DIAGNOSIS — Z96641 Presence of right artificial hip joint: Secondary | ICD-10-CM | POA: Diagnosis not present

## 2020-03-02 DIAGNOSIS — Z471 Aftercare following joint replacement surgery: Secondary | ICD-10-CM | POA: Diagnosis not present

## 2020-03-02 DIAGNOSIS — R262 Difficulty in walking, not elsewhere classified: Secondary | ICD-10-CM | POA: Diagnosis not present

## 2020-03-02 DIAGNOSIS — M1611 Unilateral primary osteoarthritis, right hip: Secondary | ICD-10-CM | POA: Diagnosis not present

## 2020-03-07 DIAGNOSIS — R262 Difficulty in walking, not elsewhere classified: Secondary | ICD-10-CM | POA: Diagnosis not present

## 2020-03-07 DIAGNOSIS — M6281 Muscle weakness (generalized): Secondary | ICD-10-CM | POA: Diagnosis not present

## 2020-03-07 DIAGNOSIS — Z471 Aftercare following joint replacement surgery: Secondary | ICD-10-CM | POA: Diagnosis not present

## 2020-03-07 DIAGNOSIS — Z96641 Presence of right artificial hip joint: Secondary | ICD-10-CM | POA: Diagnosis not present

## 2020-03-07 DIAGNOSIS — M1611 Unilateral primary osteoarthritis, right hip: Secondary | ICD-10-CM | POA: Diagnosis not present

## 2020-03-09 DIAGNOSIS — Z96641 Presence of right artificial hip joint: Secondary | ICD-10-CM | POA: Diagnosis not present

## 2020-03-09 DIAGNOSIS — Z471 Aftercare following joint replacement surgery: Secondary | ICD-10-CM | POA: Diagnosis not present

## 2020-03-09 DIAGNOSIS — R262 Difficulty in walking, not elsewhere classified: Secondary | ICD-10-CM | POA: Diagnosis not present

## 2020-03-09 DIAGNOSIS — M6281 Muscle weakness (generalized): Secondary | ICD-10-CM | POA: Diagnosis not present

## 2020-03-09 DIAGNOSIS — M1611 Unilateral primary osteoarthritis, right hip: Secondary | ICD-10-CM | POA: Diagnosis not present

## 2020-03-15 DIAGNOSIS — M1612 Unilateral primary osteoarthritis, left hip: Secondary | ICD-10-CM | POA: Diagnosis not present

## 2020-03-15 DIAGNOSIS — Z96641 Presence of right artificial hip joint: Secondary | ICD-10-CM | POA: Diagnosis not present

## 2020-03-15 DIAGNOSIS — Z471 Aftercare following joint replacement surgery: Secondary | ICD-10-CM | POA: Diagnosis not present

## 2020-04-16 DIAGNOSIS — I10 Essential (primary) hypertension: Secondary | ICD-10-CM | POA: Diagnosis not present

## 2020-04-16 DIAGNOSIS — E1140 Type 2 diabetes mellitus with diabetic neuropathy, unspecified: Secondary | ICD-10-CM | POA: Diagnosis not present

## 2020-04-16 DIAGNOSIS — E785 Hyperlipidemia, unspecified: Secondary | ICD-10-CM | POA: Diagnosis not present

## 2020-04-17 DIAGNOSIS — I10 Essential (primary) hypertension: Secondary | ICD-10-CM | POA: Diagnosis not present

## 2020-04-17 DIAGNOSIS — E1140 Type 2 diabetes mellitus with diabetic neuropathy, unspecified: Secondary | ICD-10-CM | POA: Diagnosis not present

## 2020-04-17 DIAGNOSIS — E785 Hyperlipidemia, unspecified: Secondary | ICD-10-CM | POA: Diagnosis not present

## 2020-04-24 DIAGNOSIS — H6123 Impacted cerumen, bilateral: Secondary | ICD-10-CM | POA: Diagnosis not present

## 2020-08-20 DIAGNOSIS — E785 Hyperlipidemia, unspecified: Secondary | ICD-10-CM | POA: Diagnosis not present

## 2020-08-20 DIAGNOSIS — E1140 Type 2 diabetes mellitus with diabetic neuropathy, unspecified: Secondary | ICD-10-CM | POA: Diagnosis not present

## 2020-08-21 DIAGNOSIS — E785 Hyperlipidemia, unspecified: Secondary | ICD-10-CM | POA: Diagnosis not present

## 2020-08-21 DIAGNOSIS — E1140 Type 2 diabetes mellitus with diabetic neuropathy, unspecified: Secondary | ICD-10-CM | POA: Diagnosis not present

## 2020-08-21 DIAGNOSIS — I10 Essential (primary) hypertension: Secondary | ICD-10-CM | POA: Diagnosis not present

## 2020-08-27 DIAGNOSIS — H35033 Hypertensive retinopathy, bilateral: Secondary | ICD-10-CM | POA: Diagnosis not present

## 2020-08-27 DIAGNOSIS — H5203 Hypermetropia, bilateral: Secondary | ICD-10-CM | POA: Diagnosis not present

## 2020-08-27 DIAGNOSIS — E1165 Type 2 diabetes mellitus with hyperglycemia: Secondary | ICD-10-CM | POA: Diagnosis not present

## 2020-08-27 DIAGNOSIS — H2513 Age-related nuclear cataract, bilateral: Secondary | ICD-10-CM | POA: Diagnosis not present

## 2020-08-27 DIAGNOSIS — H11153 Pinguecula, bilateral: Secondary | ICD-10-CM | POA: Diagnosis not present

## 2020-11-20 DIAGNOSIS — E1140 Type 2 diabetes mellitus with diabetic neuropathy, unspecified: Secondary | ICD-10-CM | POA: Diagnosis not present

## 2020-11-20 DIAGNOSIS — I10 Essential (primary) hypertension: Secondary | ICD-10-CM | POA: Diagnosis not present

## 2020-11-20 DIAGNOSIS — E785 Hyperlipidemia, unspecified: Secondary | ICD-10-CM | POA: Diagnosis not present

## 2020-12-24 DIAGNOSIS — N401 Enlarged prostate with lower urinary tract symptoms: Secondary | ICD-10-CM | POA: Diagnosis not present

## 2020-12-24 DIAGNOSIS — N5201 Erectile dysfunction due to arterial insufficiency: Secondary | ICD-10-CM | POA: Diagnosis not present

## 2020-12-24 DIAGNOSIS — R3915 Urgency of urination: Secondary | ICD-10-CM | POA: Diagnosis not present

## 2020-12-24 DIAGNOSIS — R972 Elevated prostate specific antigen [PSA]: Secondary | ICD-10-CM | POA: Diagnosis not present

## 2020-12-31 DIAGNOSIS — D1801 Hemangioma of skin and subcutaneous tissue: Secondary | ICD-10-CM | POA: Diagnosis not present

## 2020-12-31 DIAGNOSIS — L578 Other skin changes due to chronic exposure to nonionizing radiation: Secondary | ICD-10-CM | POA: Diagnosis not present

## 2020-12-31 DIAGNOSIS — L821 Other seborrheic keratosis: Secondary | ICD-10-CM | POA: Diagnosis not present

## 2020-12-31 DIAGNOSIS — Z23 Encounter for immunization: Secondary | ICD-10-CM | POA: Diagnosis not present

## 2020-12-31 DIAGNOSIS — L570 Actinic keratosis: Secondary | ICD-10-CM | POA: Diagnosis not present

## 2020-12-31 DIAGNOSIS — L814 Other melanin hyperpigmentation: Secondary | ICD-10-CM | POA: Diagnosis not present

## 2020-12-31 DIAGNOSIS — Z85828 Personal history of other malignant neoplasm of skin: Secondary | ICD-10-CM | POA: Diagnosis not present

## 2021-01-10 DIAGNOSIS — M47817 Spondylosis without myelopathy or radiculopathy, lumbosacral region: Secondary | ICD-10-CM | POA: Diagnosis not present

## 2021-01-10 DIAGNOSIS — Z125 Encounter for screening for malignant neoplasm of prostate: Secondary | ICD-10-CM | POA: Diagnosis not present

## 2021-01-10 DIAGNOSIS — M461 Sacroiliitis, not elsewhere classified: Secondary | ICD-10-CM | POA: Diagnosis not present

## 2021-01-10 DIAGNOSIS — E785 Hyperlipidemia, unspecified: Secondary | ICD-10-CM | POA: Diagnosis not present

## 2021-01-10 DIAGNOSIS — M1612 Unilateral primary osteoarthritis, left hip: Secondary | ICD-10-CM | POA: Diagnosis not present

## 2021-01-10 DIAGNOSIS — I10 Essential (primary) hypertension: Secondary | ICD-10-CM | POA: Diagnosis not present

## 2021-01-10 DIAGNOSIS — Z471 Aftercare following joint replacement surgery: Secondary | ICD-10-CM | POA: Diagnosis not present

## 2021-01-10 DIAGNOSIS — Z96641 Presence of right artificial hip joint: Secondary | ICD-10-CM | POA: Diagnosis not present

## 2021-01-10 DIAGNOSIS — E119 Type 2 diabetes mellitus without complications: Secondary | ICD-10-CM | POA: Diagnosis not present

## 2021-01-14 DIAGNOSIS — N401 Enlarged prostate with lower urinary tract symptoms: Secondary | ICD-10-CM | POA: Diagnosis not present

## 2021-01-14 DIAGNOSIS — E1140 Type 2 diabetes mellitus with diabetic neuropathy, unspecified: Secondary | ICD-10-CM | POA: Diagnosis not present

## 2021-01-14 DIAGNOSIS — R972 Elevated prostate specific antigen [PSA]: Secondary | ICD-10-CM | POA: Diagnosis not present

## 2021-01-14 DIAGNOSIS — Z0000 Encounter for general adult medical examination without abnormal findings: Secondary | ICD-10-CM | POA: Diagnosis not present

## 2021-01-14 DIAGNOSIS — I10 Essential (primary) hypertension: Secondary | ICD-10-CM | POA: Diagnosis not present

## 2021-01-15 ENCOUNTER — Other Ambulatory Visit: Payer: Self-pay | Admitting: Internal Medicine

## 2021-01-15 DIAGNOSIS — Z794 Long term (current) use of insulin: Secondary | ICD-10-CM

## 2021-02-18 ENCOUNTER — Ambulatory Visit
Admission: RE | Admit: 2021-02-18 | Discharge: 2021-02-18 | Disposition: A | Discharge status: Home / Self Care | Payer: No Typology Code available for payment source | Source: Ambulatory Visit | Attending: Internal Medicine | Admitting: Internal Medicine

## 2021-02-18 DIAGNOSIS — E1140 Type 2 diabetes mellitus with diabetic neuropathy, unspecified: Secondary | ICD-10-CM

## 2021-02-18 DIAGNOSIS — I10 Essential (primary) hypertension: Secondary | ICD-10-CM | POA: Diagnosis not present

## 2021-02-18 DIAGNOSIS — Z794 Long term (current) use of insulin: Secondary | ICD-10-CM

## 2021-02-18 DIAGNOSIS — Z8249 Family history of ischemic heart disease and other diseases of the circulatory system: Secondary | ICD-10-CM | POA: Diagnosis not present

## 2021-02-21 DIAGNOSIS — I2510 Atherosclerotic heart disease of native coronary artery without angina pectoris: Secondary | ICD-10-CM | POA: Diagnosis not present

## 2021-02-21 DIAGNOSIS — I10 Essential (primary) hypertension: Secondary | ICD-10-CM | POA: Diagnosis not present

## 2021-02-21 DIAGNOSIS — E1140 Type 2 diabetes mellitus with diabetic neuropathy, unspecified: Secondary | ICD-10-CM | POA: Diagnosis not present

## 2021-02-21 DIAGNOSIS — E785 Hyperlipidemia, unspecified: Secondary | ICD-10-CM | POA: Diagnosis not present

## 2021-03-14 ENCOUNTER — Other Ambulatory Visit: Payer: Self-pay

## 2021-03-14 ENCOUNTER — Encounter: Payer: Self-pay | Admitting: Cardiology

## 2021-03-14 ENCOUNTER — Ambulatory Visit: Payer: PPO | Admitting: Cardiology

## 2021-03-14 VITALS — BP 153/69 | HR 61 | Temp 97.9°F | Resp 16 | Ht 70.0 in | Wt 202.4 lb

## 2021-03-14 DIAGNOSIS — E7800 Pure hypercholesterolemia, unspecified: Secondary | ICD-10-CM

## 2021-03-14 DIAGNOSIS — N1831 Chronic kidney disease, stage 3a: Secondary | ICD-10-CM

## 2021-03-14 DIAGNOSIS — R0989 Other specified symptoms and signs involving the circulatory and respiratory systems: Secondary | ICD-10-CM | POA: Diagnosis not present

## 2021-03-14 DIAGNOSIS — R931 Abnormal findings on diagnostic imaging of heart and coronary circulation: Secondary | ICD-10-CM | POA: Diagnosis not present

## 2021-03-14 DIAGNOSIS — E1122 Type 2 diabetes mellitus with diabetic chronic kidney disease: Secondary | ICD-10-CM

## 2021-03-14 DIAGNOSIS — I10 Essential (primary) hypertension: Secondary | ICD-10-CM

## 2021-03-14 DIAGNOSIS — I129 Hypertensive chronic kidney disease with stage 1 through stage 4 chronic kidney disease, or unspecified chronic kidney disease: Secondary | ICD-10-CM | POA: Diagnosis not present

## 2021-03-14 NOTE — Progress Notes (Signed)
Primary Physician/Referring:  Deland Pretty, MD  Patient ID: Jose Higgins, male    DOB: 18-Jul-1940, 81 y.o.   MRN: [id number]  Chief Complaint  Patient presents with   Coronary Artery Disease   New Patient (Initial Visit)    Referred by Deland Pretty, MD   HPI:    Jose Higgins  is a 81 y.o. Caucasian male patient who is very active, goes to the gym 3 times a week by walking 1 mile to the gym and back and also uses different machines or there without any dyspnea or chest pain, referred to me for cardiac risk stratification after he underwent coronary calcium scoring.  Patient's past history significant for hypertension, hyperlipidemia, diabetes mellitus and stage III chronic kidney disease.   Past Medical History:  Diagnosis Date   Diabetes mellitus without complication ([place])    Hypertension    Past Surgical History:  Procedure Laterality Date   hip replacment     Family History  Problem Relation Age of Onset   Emphysema Mother    Heart failure Father    Heart attack Father 23    Social History   Tobacco Use   Smoking status: Never   Smokeless tobacco: Never  Substance Use Topics   Alcohol use: Never   Marital Status: Married  ROS  Review of Systems  Cardiovascular:  Negative for chest pain, dyspnea on exertion and leg swelling.  Objective  Blood pressure (!) 153/69, pulse 61, temperature 97.9 F (36.6 C), temperature source Temporal, resp. rate 16, height 5' 10"  (1.778 m), weight 202 lb 6.4 oz (91.8 kg), SpO2 97 %. Body mass index is 29.04 kg/m.  Vitals with BMI 03/14/2021 01/17/2020 02/25/2019  Height 5' 10"  - -  Weight 202 lbs 6 oz - -  BMI 27.78 - -  Systolic 242 353 614  Diastolic 69 56 71  Pulse 61 78 62    Physical Exam Neck:     Vascular: No JVD.  Cardiovascular:     Rate and Rhythm: Normal rate and regular rhythm.     Pulses: Intact distal pulses.          Carotid pulses are  on the right side with bruit and  on the left side with bruit.    Heart  sounds: S1 normal and S2 normal. Murmur heard.  Early systolic murmur is present with a grade of 2/6 at the upper right sternal border.    No gallop.  Pulmonary:     Effort: Pulmonary effort is normal.     Breath sounds: Normal breath sounds.  Abdominal:     General: Bowel sounds are normal.     Palpations: Abdomen is soft.  Musculoskeletal:     Right lower leg: No edema.     Left lower leg: No edema.     Medications and allergies  No Known Allergies   Medication list after today's encounter    Current Outpatient Medications:    acetaminophen (TYLENOL) 325 MG tablet, Take 650 mg by mouth every 6 (six) hours as needed for moderate pain or fever., Disp: , Rfl:    alfuzosin (UROXATRAL) 10 MG 24 hr tablet, Take 10 mg by mouth daily with breakfast., Disp: , Rfl:    aspirin EC 81 MG tablet, Take 81 mg by mouth daily., Disp: , Rfl:    atorvastatin (LIPITOR) 20 MG tablet, Take 1 tablet (20 mg total) by mouth daily., Disp:  , Rfl:    b complex vitamins capsule,  Take 1 capsule by mouth 2 (two) times a week., Disp: , Rfl:    calcium carbonate (OSCAL) 1500 (600 Ca) MG TABS tablet, Take 1,500 mg by mouth 2 (two) times daily with a meal., Disp: , Rfl:    cetirizine (ZYRTEC) 10 MG tablet, Take 10 mg by mouth daily., Disp: , Rfl:    cetirizine (ZYRTEC) 10 MG tablet, 1 tablet, Disp: , Rfl:    co-enzyme Q-10 30 MG capsule, Take 30 mg by mouth 3 (three) times daily., Disp: , Rfl:    Empagliflozin-metFORMIN HCl ER (SYNJARDY XR) 12.05-998 MG TB24, Take 12.5 tablets by mouth daily., Disp: , Rfl:    ipratropium (ATROVENT) 0.06 % nasal spray, Place 2 sprays into both nostrils 3 (three) times daily as needed for rhinitis. , Disp: , Rfl:    irbesartan (AVAPRO) 150 MG tablet, Take 150 mg by mouth daily. , Disp: , Rfl:    metoprolol succinate (TOPROL-XL) 50 MG 24 hr tablet, Take 1 tablet by mouth daily., Disp: , Rfl:    Omega-3 Fatty Acids (FISH OIL) 1000 MG CPDR, Take 1 capsule by mouth in the morning and at  bedtime., Disp: , Rfl:    sitaGLIPtin-metformin (JANUMET) 50-1000 MG tablet, Take 1 tablet by mouth 2 (two) times daily with a meal., Disp: , Rfl:    chlorproMAZINE (THORAZINE) 25 MG tablet, Take 1 tablet (25 mg total) by mouth 3 (three) times daily as needed for hiccoughs. (Patient not taking: Reported on 03/14/2021), Disp: 21 tablet, Rfl: 0  Laboratory examination:   No results for input(s): NA, K, CL, CO2, GLUCOSE, BUN, CREATININE, CALCIUM, GFRNONAA, GFRAA in the last 8760 hours. CrCl cannot be calculated (Patient's most recent lab result is older than the maximum 21 days allowed.).  CMP Latest Ref Rng & Units 01/17/2020 02/25/2019 02/24/2019  Glucose 70 - 99 mg/dL 282(H) 176(H) 199(H)  BUN 8 - 23 mg/dL 15 16 20   Creatinine 0.61 - 1.24 mg/dL 1.23 0.99 0.93  Sodium 135 - 145 mmol/L 128(L) 134(L) 134(L)  Potassium 3.5 - 5.1 mmol/L 3.6 3.9 3.4(L)  Chloride 98 - 111 mmol/L 99 108 104  CO2 22 - 32 mmol/L 20(L) 23 23  Calcium 8.9 - 10.3 mg/dL 8.6(L) 7.9(L) 7.8(L)  Total Protein 6.5 - 8.1 g/dL 5.9(L) 5.5(L) 5.5(L)  Total Bilirubin 0.3 - 1.2 mg/dL 2.6(H) 1.5(H) 1.1  Alkaline Phos 38 - 126 U/L 38 37(L) 28(L)  AST 15 - 41 U/L 36 32 34  ALT 0 - 44 U/L 20 33 30   CBC Latest Ref Rng & Units 01/17/2020 02/25/2019 02/24/2019  WBC 4.0 - 10.5 K/uL 10.5 8.3 6.1  Hemoglobin 13.0 - 17.0 g/dL 11.8(L) 12.1(L) 11.5(L)  Hematocrit 39.0 - 52.0 % 35.0(L) 34.5(L) 32.5(L)  Platelets 150 - 400 K/uL 131(L) 191 174     HEMOGLOBIN A1C Lab Results  Component Value Date   HGBA1C 7.1 (H) 02/22/2019   MPG 157.07 02/22/2019   TSH No results for input(s): TSH in the last 8760 hours.  External labs:   Labs 01/10/2021:  Total cholesterol 140, triglycerides 89, HDL 54, LDL 68.  Hb 15.0/HCT 45.4, platelets 170.  Normal indicis.  Sodium 139, potassium 4.6, BUN 10, creatinine 1.21, EGFR 56 mL / 64 mL.  Labs 08/20/2020:  A1c 7.4%.  Radiology:   NA  Cardiac Studies:   Coronary calcium score  02/18/2021: CORONARY CALCIUM SCORES: Left Main: 0 LAD: 653 LCx: 181 RCA: 167 Total Agatston Score: 1002.  MESA database percentile: 69 Ascending and descending thoracic  aortic measurements normal.  Aortic valve calcification, correlate with echocardiogram.  No significant extracardiac abnormality.  EKG:   EKG 03/14/2021: Normal sinus rhythm at the rate of 57 bpm.    Assessment     ICD-10-CM   1. Elevated coronary artery calcium score 02/18/2021: Total Agatston Score: 1002.  MESA database percentile: 69  R93.1 PCV ECHOCARDIOGRAM COMPLETE    2. Type 2 diabetes mellitus with stage 3a chronic kidney disease, without long-term current use of insulin (HCC)  E11.22    N18.31     3. Primary hypertension  I10 EKG 12-Lead    PCV ECHOCARDIOGRAM COMPLETE    4. Bilateral carotid bruits  R09.89 PCV CAROTID DUPLEX (BILATERAL)    5. Pure hypercholesterolemia  E78.00       No orders of the defined types were placed in this encounter.  Orders Placed This Encounter  Procedures   EKG 12-Lead   PCV ECHOCARDIOGRAM COMPLETE    Standing Status:   Future    Standing Expiration Date:   03/14/2022   Recommendations:   Jose Higgins is a 81 y.o. Caucasian male patient who is very active, goes to the gym 3 times a week by walking 1 mile to the gym and back and also uses different machines or there without any dyspnea or chest pain, referred to me for cardiac risk stratification after he underwent coronary calcium scoring.  Patient's past history significant for hypertension, hyperlipidemia, diabetes mellitus and stage III chronic kidney disease.  His physical examination except for systolic ejection murmur in the aortic area and bilateral carotid bruit left worse than the right, is completely normal.  He maintains an active lifestyle, completely asymptomatic without any dyspnea or chest pain.  Hence do not think he needs a stress test.  Primary prevention was evaluated, he is on appropriate medical therapy.   Blood pressure at home is very well controlled although was elevated here, he will continue to monitor this at home and let me know.  Reviewed his external labs, lipids under excellent control.  Diabetes is also under control with mild elevation in A1c.  He does have stage III chronic kidney disease and is appropriately on Avapro.  We will obtain an echocardiogram to evaluate his murmur and also carotid artery duplex and I will like to see him back in 6 weeks for follow-up.  I congratulated him on being active physically.  Advised him to lose about 5 pounds in weight.    Adrian Prows, MD, [facility] 03/14/2021, 2:25 PM Office: [phone]

## 2021-03-29 ENCOUNTER — Other Ambulatory Visit: Payer: Self-pay

## 2021-03-29 ENCOUNTER — Ambulatory Visit: Payer: PPO

## 2021-03-29 DIAGNOSIS — I10 Essential (primary) hypertension: Secondary | ICD-10-CM

## 2021-03-29 DIAGNOSIS — R0989 Other specified symptoms and signs involving the circulatory and respiratory systems: Secondary | ICD-10-CM | POA: Diagnosis not present

## 2021-03-29 DIAGNOSIS — R931 Abnormal findings on diagnostic imaging of heart and coronary circulation: Secondary | ICD-10-CM

## 2021-04-25 ENCOUNTER — Ambulatory Visit: Payer: PPO | Admitting: Cardiology

## 2021-04-25 ENCOUNTER — Encounter: Payer: Self-pay | Admitting: Cardiology

## 2021-04-25 VITALS — BP 126/73 | HR 72 | Temp 97.8°F | Resp 16 | Ht 70.0 in | Wt 197.0 lb

## 2021-04-25 DIAGNOSIS — I350 Nonrheumatic aortic (valve) stenosis: Secondary | ICD-10-CM | POA: Diagnosis not present

## 2021-04-25 DIAGNOSIS — R931 Abnormal findings on diagnostic imaging of heart and coronary circulation: Secondary | ICD-10-CM

## 2021-04-25 DIAGNOSIS — I10 Essential (primary) hypertension: Secondary | ICD-10-CM | POA: Diagnosis not present

## 2021-04-25 DIAGNOSIS — E7800 Pure hypercholesterolemia, unspecified: Secondary | ICD-10-CM

## 2021-04-25 NOTE — Progress Notes (Signed)
? ?Primary Physician/Referring:  Deland Pretty, MD ? ?Patient ID: Jose Higgins, male    DOB: 03/02/1940, 81 y.o.   MRN: [id number] ? ?Chief Complaint  ?Patient presents with  ? Hypertension  ? carotid bruit  ? Heart Murmur  ? Follow-up  ?  6 weeks  ? ?HPI:   ? ?Jose Higgins  is a 81 y.o. Caucasian male patient who is very active, goes to the gym 3 times a week by walking 1 mile to the gym and back and also uses different machines or there without any dyspnea or chest pain, referred to me for cardiac risk stratification after he underwent coronary calcium scoring. ? ?Patient's past history significant for hypertension, hyperlipidemia, diabetes mellitus and stage III chronic kidney disease. He maintains an active lifestyle, completely asymptomatic without any dyspnea or chest pain.  Due to suspicion for aortic stenosis, underwent echocardiogram and presents for follow-up.  No new symptoms. ? ?Past Medical History:  ?Diagnosis Date  ? Diabetes mellitus without complication ([place])   ? Hypertension   ? ?Past Surgical History:  ?Procedure Laterality Date  ? hip replacment    ? ?Family History  ?Problem Relation Age of Onset  ? Emphysema Mother   ? Heart failure Father   ? Heart attack Father 40  ?  ?Social History  ? ?Tobacco Use  ? Smoking status: Never  ? Smokeless tobacco: Never  ?Substance Use Topics  ? Alcohol use: Never  ? ?Marital Status: Married  ?ROS  ?Review of Systems  ?Cardiovascular:  Negative for chest pain, dyspnea on exertion and leg swelling.  ?Objective  ?Blood pressure 126/73, pulse 72, temperature 97.8 ?F (36.6 ?C), resp. rate 16, height _0  (1.778 m), weight 197 lb (89.4 kg), SpO2 97 %. Body mass index is 28.27 kg/m?.  ? ?  04/25/2021  ?  2:44 PM 03/14/2021  ?  1:22 PM 01/17/2020  ?  9:40 PM  ?Vitals with BMI  ?Height _1  _2    ?Weight 197 lbs 202 lbs 6 oz   ?BMI 28.27 29.04   ?Systolic 916 384 665  ?Diastolic 73 69 56  ?Pulse 72 61 78  ?  ?Physical Exam ?Neck:  ?   Vascular: No JVD.   ?Cardiovascular:  ?   Rate and Rhythm: Normal rate and regular rhythm.  ?   Pulses: Intact distal pulses.     ?     Carotid pulses are  on the right side with bruit and  on the left side with bruit. ?   Heart sounds: S1 normal and S2 normal. Murmur heard.  ?Early systolic murmur is present with a grade of 2/6 at the upper right sternal border.  ?  No gallop.  ?Pulmonary:  ?   Effort: Pulmonary effort is normal.  ?   Breath sounds: Normal breath sounds.  ?Abdominal:  ?   General: Bowel sounds are normal.  ?   Palpations: Abdomen is soft.  ?Musculoskeletal:  ?   Right lower leg: No edema.  ?   Left lower leg: No edema.  ?  ? ?Medications and allergies  ?No Known Allergies  ? ?Medication list after today's encounter  ? ? ?Current Outpatient Medications:  ?  acetaminophen (TYLENOL) 325 MG tablet, Take 650 mg by mouth every 6 (six) hours as needed for moderate pain or fever., Disp: , Rfl:  ?  alfuzosin (UROXATRAL) 10 MG 24 hr tablet, Take 10 mg by mouth daily with breakfast., Disp: , Rfl:  ?  aspirin EC 81 MG tablet, Take 81 mg by mouth daily., Disp: , Rfl:  ?  atorvastatin (LIPITOR) 20 MG tablet, Take 1 tablet (20 mg total) by mouth daily., Disp:  , Rfl:  ?  b complex vitamins capsule, Take 1 capsule by mouth 2 (two) times a week., Disp: , Rfl:  ?  calcium carbonate (OSCAL) 1500 (600 Ca) MG TABS tablet, Take 1,500 mg by mouth 2 (two) times daily with a meal., Disp: , Rfl:  ?  cetirizine (ZYRTEC) 10 MG tablet, Take 10 mg by mouth daily., Disp: , Rfl:  ?  cetirizine (ZYRTEC) 10 MG tablet, 1 tablet, Disp: , Rfl:  ?  co-enzyme Q-10 30 MG capsule, Take 30 mg by mouth 3 (three) times daily., Disp: , Rfl:  ?  Empagliflozin-metFORMIN HCl ER (SYNJARDY XR) 12.05-998 MG TB24, Take 12.5 tablets by mouth daily., Disp: , Rfl:  ?  ipratropium (ATROVENT) 0.06 % nasal spray, Place 2 sprays into both nostrils 3 (three) times daily as needed for rhinitis. , Disp: , Rfl:  ?  irbesartan (AVAPRO) 150 MG tablet, Take 150 mg by mouth daily. ,  Disp: , Rfl:  ?  metoprolol succinate (TOPROL-XL) 50 MG 24 hr tablet, Take 1 tablet by mouth daily., Disp: , Rfl:  ?  Omega-3 Fatty Acids (FISH OIL) 1000 MG CPDR, Take 1 capsule by mouth in the morning and at bedtime., Disp: , Rfl:  ?  sitaGLIPtin-metformin (JANUMET) 50-1000 MG tablet, Take 1 tablet by mouth 2 (two) times daily with a meal., Disp: , Rfl:  ? ?Laboratory examination:  ? ?No results for input(s): NA, K, CL, CO2, GLUCOSE, BUN, CREATININE, CALCIUM, GFRNONAA, GFRAA in the last 8760 hours. ?CrCl cannot be calculated (Patient's most recent lab result is older than the maximum 21 days allowed.).  ? ?  Latest Ref Rng & Units 01/17/2020  ?  9:49 PM 02/25/2019  ?  7:24 AM 02/24/2019  ?  2:43 AM  ?CMP  ?Glucose 70 - 99 mg/dL 282   176   199    ?BUN 8 - 23 mg/dL _0 ?Creatinine 0.61 - 1.24 mg/dL 1.23   0.99   0.93    ?Sodium 135 - 145 mmol/L 128   134   134    ?Potassium 3.5 - 5.1 mmol/L 3.6   3.9   3.4    ?Chloride 98 - 111 mmol/L 99   108   104    ?CO2 22 - 32 mmol/L _1 ?Calcium 8.9 - 10.3 mg/dL 8.6   7.9   7.8    ?Total Protein 6.5 - 8.1 g/dL 5.9   5.5   5.5    ?Total Bilirubin 0.3 - 1.2 mg/dL 2.6   1.5   1.1    ?Alkaline Phos 38 - 126 U/L 38   37   28    ?AST 15 - 41 U/L 36   32   34    ?ALT 0 - 44 U/L 20   33   30    ? ? ?  Latest Ref Rng & Units 01/17/2020  ?  9:49 PM 02/25/2019  ?  7:24 AM 02/24/2019  ?  2:43 AM  ?CBC  ?WBC 4.0 - 10.5 K/uL 10.5   8.3   6.1    ?Hemoglobin 13.0 - 17.0 g/dL 11.8   12.1   11.5    ?Hematocrit 39.0 - 52.0 %  35.0   34.5   32.5    ?Platelets 150 - 400 K/uL 131   191   174    ? ?  ?HEMOGLOBIN A1C ?Lab Results  ?Component Value Date  ? HGBA1C 7.1 (H) 02/22/2019  ? MPG 157.07 02/22/2019  ? ?TSH ?No results for input(s): TSH in the last 8760 hours. ? ?External labs:  ? ?Labs 01/10/2021: ? ?Total cholesterol 140, triglycerides 89, HDL 54, LDL 68. ? ?Hb 15.0/HCT 45.4, platelets 170.  Normal indicis. ? ?Sodium 139, potassium 4.6, BUN 10, creatinine 1.21, EGFR 56 mL /  64 mL. ? ?Labs 08/20/2020: ? ?A1c 7.4%. ? ?Radiology:  ? ?NA ? ?Cardiac Studies:  ? ?Coronary calcium score 02/18/2021: ?CORONARY CALCIUM SCORES: ?Left Main: 0 ?LAD: 653 ?LCx: 181 ?RCA: 167 ?Total Agatston Score: 1002.  MESA database percentile: 3 ?Ascending and descending thoracic aortic measurements normal.  Aortic valve calcification, correlate with echocardiogram.  No significant extracardiac abnormality. ? ?Carotid artery duplex 03/29/2021:  ?No hemodynamically significant arterial disease in the internal carotid  ?artery bilaterally. Minimal heterogeneous plaque noted in the carotid  ?arteries. Left carotid artery demonstrates tortuosity and may be source of  bruit.  ?Antegrade right vertebral artery flow. Antegrade left vertebral artery flow. ? ?PCV ECHOCARDIOGRAM COMPLETE 03/29/2021  ?Normal LV systolic function with visual EF 60-65%. Left ventricle cavity is normal in size. Normal left ventricular wall thickness. Normal global wall motion. Indeterminate diastolic filling pattern, normal LAP. ?Left atrial cavity is mildly dilated. ?Trileaflet aortic valve. Mild aortic stenosis.  Peak velocity 2.28ms, Peak Pressure Gradient 32 mmHg, Mean Gradient 17 mmHg, AVA *1.51 cm?, DI 0.40. ?Trace aortic regurgitation. ?Mild tricuspid regurgitation. No evidence of pulmonary hypertension. RVSP measures 31 mmHg. ?Mild pulmonic regurgitation. ?No prior study for comparison. ?   ?EKG:  ? ?EKG 03/14/2021: Normal sinus rhythm at the rate of 57 bpm.   ? ?Assessment  ? ?  ICD-10-CM   ?1. Elevated coronary artery calcium score 02/18/2021: Total Agatston Score: 1002.  MESA database percentile: 69  R93.1   ?  ?2. Primary hypertension  I10   ?  ?3. Pure hypercholesterolemia  E78.00   ?  ?4. 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  ?  ?No orders of the defined types were placed in this encounter. ? ?No orders of the defined types were placed in this encounter. ? ?Recommendations:  ? ?Catarino W CJorgeis a 81y.o. Caucasian male patient who is very  active, goes to the gym 3 times a week by walking 1 mile to the gym and back and also uses different machines or there without any dyspnea or chest pain, referred to me for cardiac risk stratification after

## 2021-04-26 ENCOUNTER — Encounter: Payer: Self-pay | Admitting: Cardiology

## 2021-05-28 DIAGNOSIS — E785 Hyperlipidemia, unspecified: Secondary | ICD-10-CM | POA: Diagnosis not present

## 2021-05-28 DIAGNOSIS — E1140 Type 2 diabetes mellitus with diabetic neuropathy, unspecified: Secondary | ICD-10-CM | POA: Diagnosis not present

## 2021-05-28 DIAGNOSIS — I10 Essential (primary) hypertension: Secondary | ICD-10-CM | POA: Diagnosis not present

## 2021-05-28 DIAGNOSIS — I2510 Atherosclerotic heart disease of native coronary artery without angina pectoris: Secondary | ICD-10-CM | POA: Diagnosis not present

## 2021-08-10 IMAGING — MR MR HEAD W/O CM
9 of 10 series · 39 of 48 positions shown · non-contrast
Comparison: Head CT yesterday.

CLINICAL DATA: Ataxia.  Coronavirus infection.  Negative head CT.

EXAM:
MRI HEAD WITHOUT CONTRAST
TECHNIQUE: Multiplanar, multiecho pulse sequences of the brain and surrounding
structures were obtained without intravenous contrast.

[Series 3: DWI · axial · 3.0mm · 1.09mm/px · z∈[-61,+104]mm · 11 of 114 slices shown (1 of 4)]
[im 1/114]
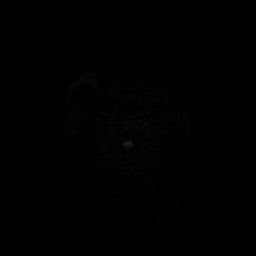
[im 12/114]
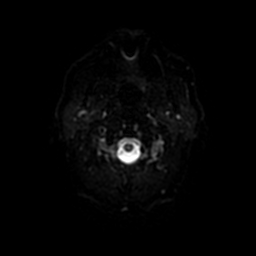
[im 23/114]
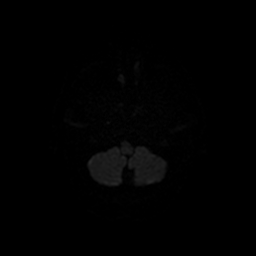
[im 34/114]
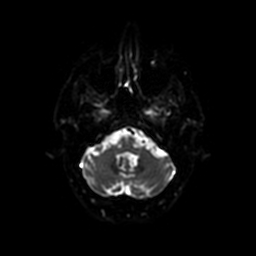
[im 46/114]
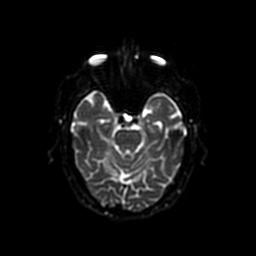
[im 57/114]
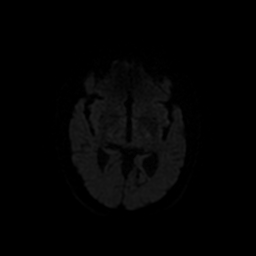
[im 68/114]
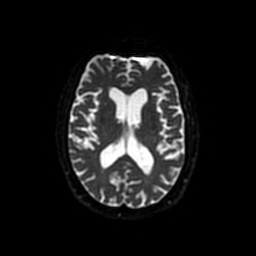
[im 80/114]
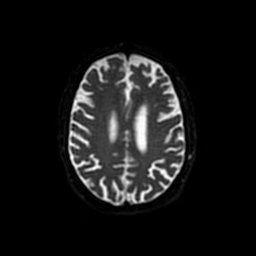
[im 91/114]
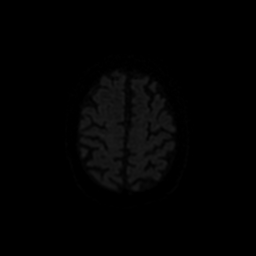
[im 102/114]
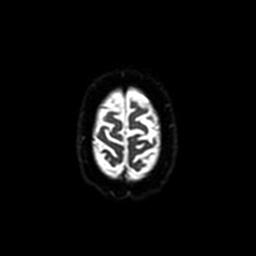
[im 114/114]
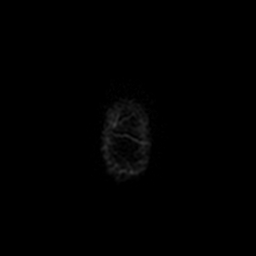

[Series 4: DWI · coronal · 5.0mm · 1.09mm/px · 8 of 78 slices shown (2 of 4)]
[im 1/78]
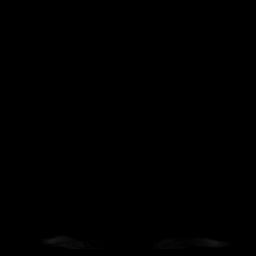
[im 12/78]
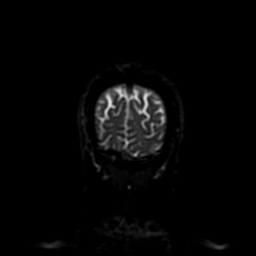
[im 23/78]
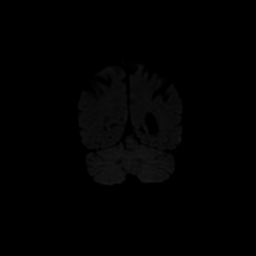
[im 34/78]
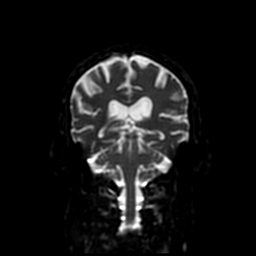
[im 45/78]
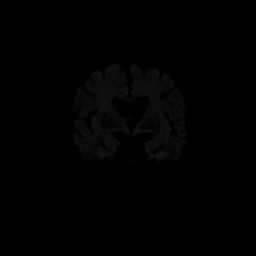
[im 56/78]
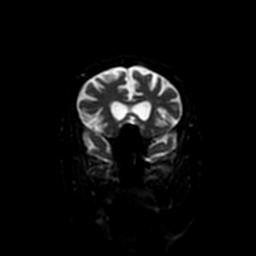
[im 67/78]
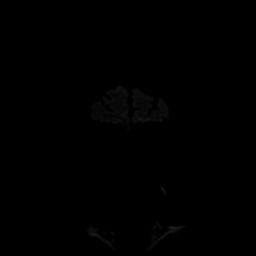
[im 78/78]
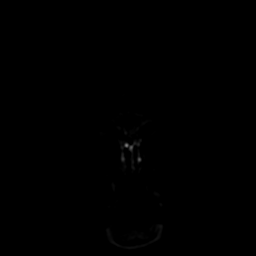

[Series 5: T1 · sagittal · 5.0mm · 0.47mm/px · 2 of 22 slices shown (1 of 2)]
[im 1/22]
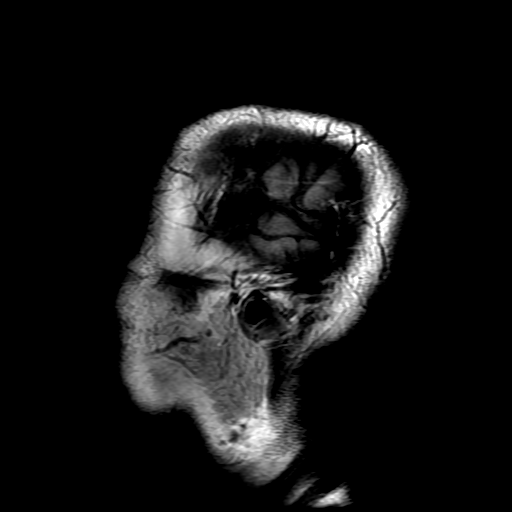
[im 22/22]
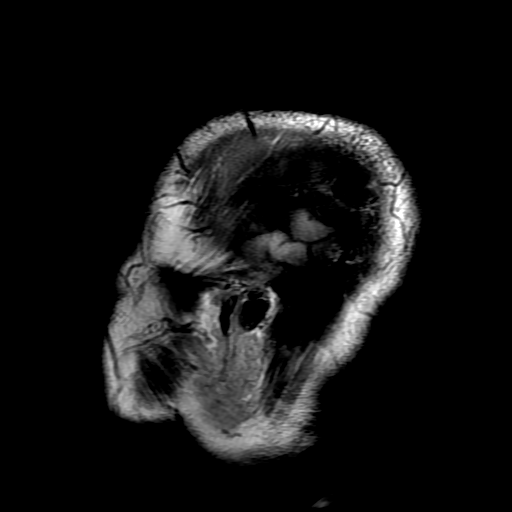

[Series 6: T2 · axial · 5.0mm · 0.43mm/px · z∈[-68,+87]mm · 2 of 27 slices shown (1 of 2)]
[im 1/27]
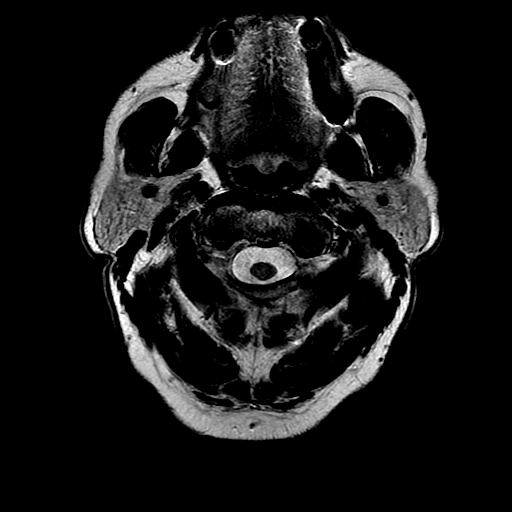
[im 27/27]
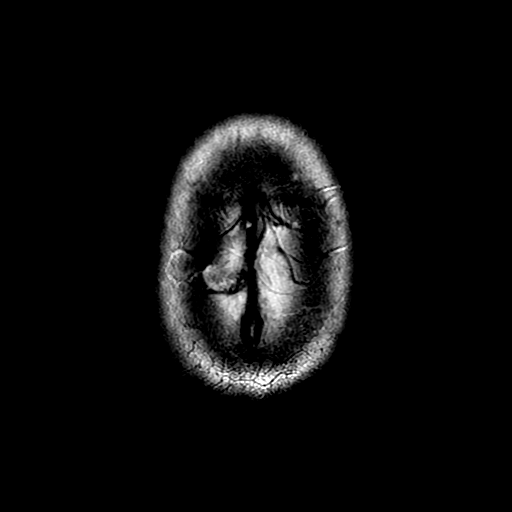

[Series 7: FLAIR · axial · 3.0mm · 0.43mm/px · z∈[-68,+87]mm · 2 of 27 slices shown]
[im 1/27]
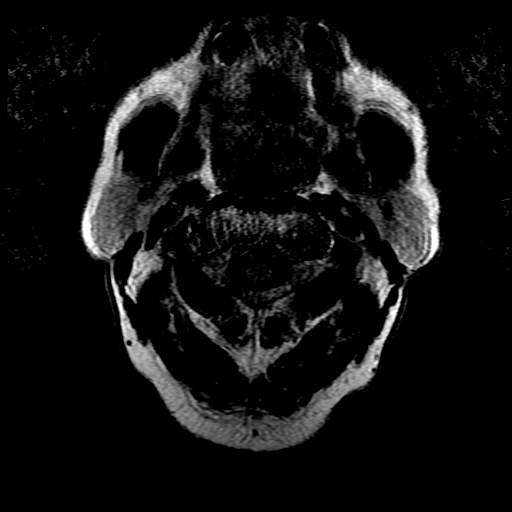
[im 27/27]
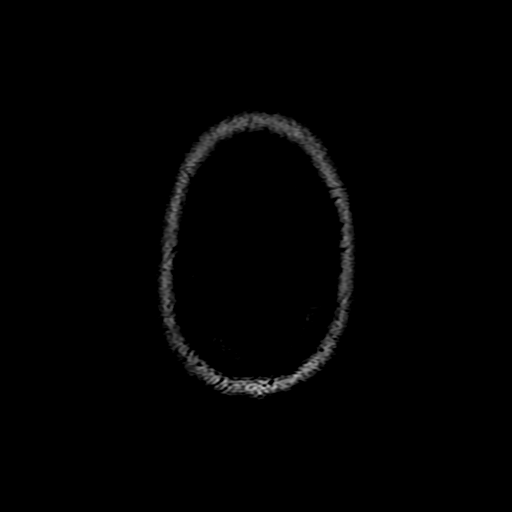

[Series 9: T1 · axial · 3.0mm · 0.43mm/px · z∈[-65,-11]mm · 3 of 112 slices shown (2 of 2)]
[im 1/112]
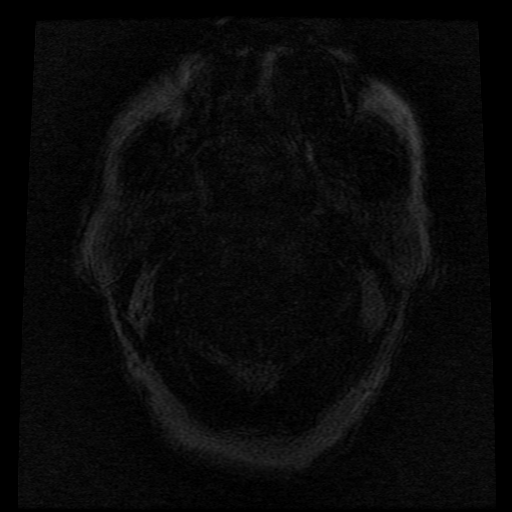
[im 13/112]
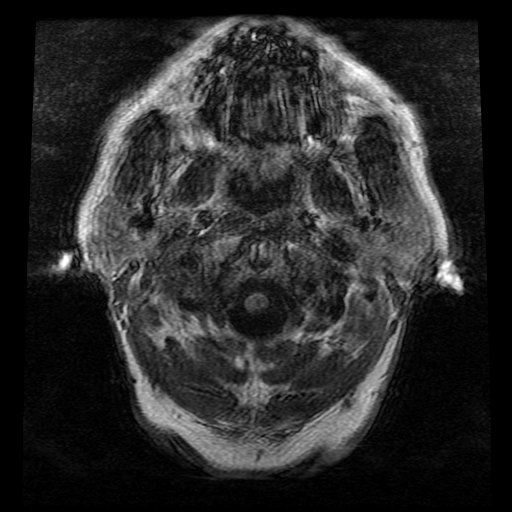
[im 38/112]
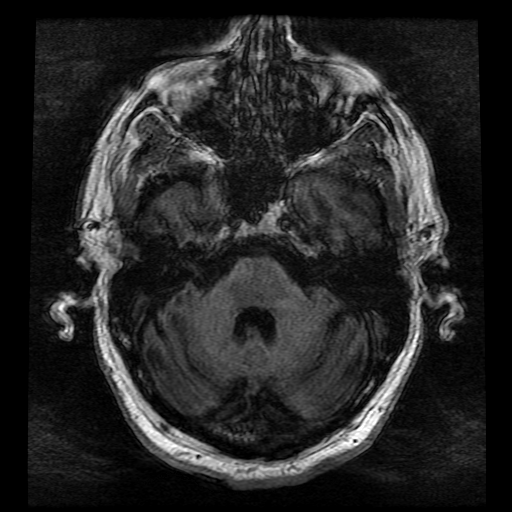

[Series 10: T2 · coronal · 5.0mm · 0.39mm/px · 2 of 25 slices shown (2 of 2)]
[im 1/25]
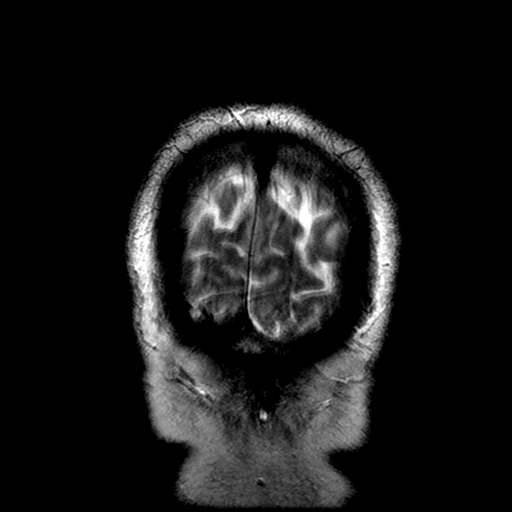
[im 25/25]
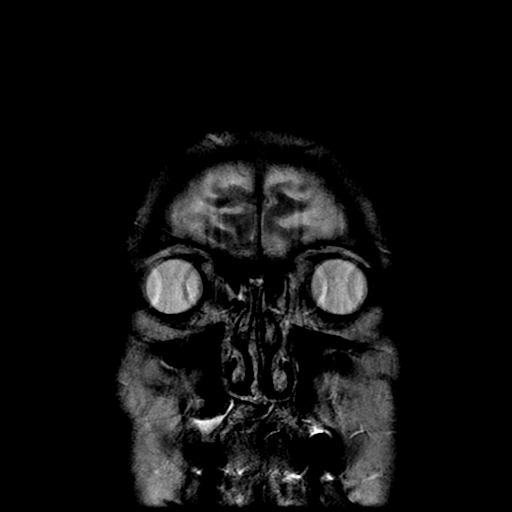

[Series 300: DWI · axial · 3.0mm · 1.09mm/px · z∈[-61,+104]mm · 5 of 57 slices shown (3 of 4)]
[im 1/57]
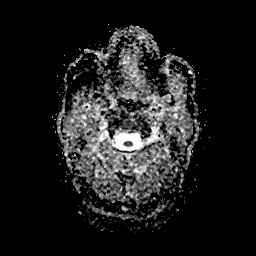
[im 15/57]
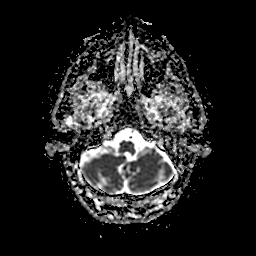
[im 29/57]
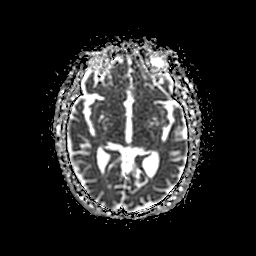
[im 43/57]
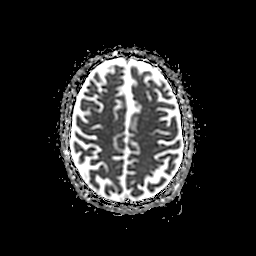
[im 57/57]
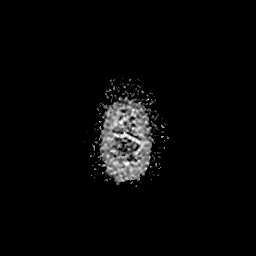

[Series 400: DWI · coronal · 5.0mm · 1.09mm/px · 4 of 39 slices shown (4 of 4)]
[im 1/39]
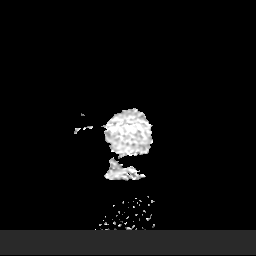
[im 13/39]
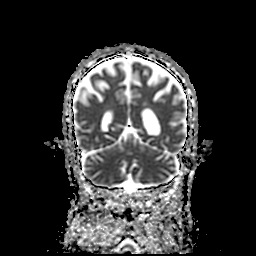
[im 26/39]
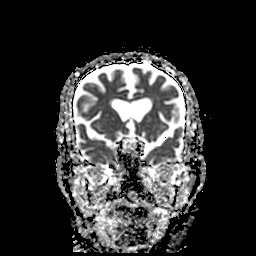
[im 39/39]
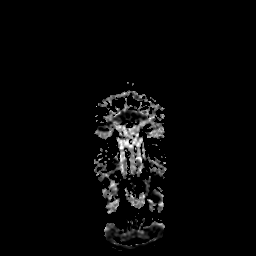

[39 of 48 positions shown; findings below may reference images not displayed]

FINDINGS: Brain: Diffusion imaging does not show any acute or subacute
infarction. The brainstem is normal. Minimal small vessel change
affects the cerebral hemispheric white matter, less than often seen
at this age. No large vessel infarction. No mass lesion, hemorrhage,
hydrocephalus or extra-axial collection.

Vascular: Major vessels at the base of the brain show flow.

Skull and upper cervical spine: Negative

Sinuses/Orbits: Clear/normal

Other: None
IMPRESSION: No acute finding. Essentially normal study for age. Very few
punctate foci of T2 and FLAIR signal affecting the hemispheric white
matter, less than often seen at this age.

## 2021-08-22 DIAGNOSIS — I10 Essential (primary) hypertension: Secondary | ICD-10-CM | POA: Diagnosis not present

## 2021-08-22 DIAGNOSIS — E7800 Pure hypercholesterolemia, unspecified: Secondary | ICD-10-CM | POA: Diagnosis not present

## 2021-08-22 DIAGNOSIS — E1140 Type 2 diabetes mellitus with diabetic neuropathy, unspecified: Secondary | ICD-10-CM | POA: Diagnosis not present

## 2021-08-22 DIAGNOSIS — R972 Elevated prostate specific antigen [PSA]: Secondary | ICD-10-CM | POA: Diagnosis not present

## 2021-08-22 DIAGNOSIS — Z125 Encounter for screening for malignant neoplasm of prostate: Secondary | ICD-10-CM | POA: Diagnosis not present

## 2021-08-29 DIAGNOSIS — E785 Hyperlipidemia, unspecified: Secondary | ICD-10-CM | POA: Diagnosis not present

## 2021-08-29 DIAGNOSIS — E1140 Type 2 diabetes mellitus with diabetic neuropathy, unspecified: Secondary | ICD-10-CM | POA: Diagnosis not present

## 2021-08-29 DIAGNOSIS — R972 Elevated prostate specific antigen [PSA]: Secondary | ICD-10-CM | POA: Diagnosis not present

## 2021-08-29 DIAGNOSIS — I10 Essential (primary) hypertension: Secondary | ICD-10-CM | POA: Diagnosis not present

## 2021-08-29 DIAGNOSIS — E875 Hyperkalemia: Secondary | ICD-10-CM | POA: Diagnosis not present

## 2021-08-29 DIAGNOSIS — I2510 Atherosclerotic heart disease of native coronary artery without angina pectoris: Secondary | ICD-10-CM | POA: Diagnosis not present

## 2021-09-13 DIAGNOSIS — H2513 Age-related nuclear cataract, bilateral: Secondary | ICD-10-CM | POA: Diagnosis not present

## 2021-09-13 DIAGNOSIS — H35033 Hypertensive retinopathy, bilateral: Secondary | ICD-10-CM | POA: Diagnosis not present

## 2021-09-13 DIAGNOSIS — E119 Type 2 diabetes mellitus without complications: Secondary | ICD-10-CM | POA: Diagnosis not present

## 2021-09-13 DIAGNOSIS — H11153 Pinguecula, bilateral: Secondary | ICD-10-CM | POA: Diagnosis not present

## 2021-09-24 DIAGNOSIS — M25561 Pain in right knee: Secondary | ICD-10-CM | POA: Diagnosis not present

## 2021-09-24 DIAGNOSIS — M25562 Pain in left knee: Secondary | ICD-10-CM | POA: Diagnosis not present

## 2021-10-23 DIAGNOSIS — M2341 Loose body in knee, right knee: Secondary | ICD-10-CM | POA: Diagnosis not present

## 2021-10-23 DIAGNOSIS — M11261 Other chondrocalcinosis, right knee: Secondary | ICD-10-CM | POA: Diagnosis not present

## 2021-10-23 DIAGNOSIS — M1711 Unilateral primary osteoarthritis, right knee: Secondary | ICD-10-CM | POA: Diagnosis not present

## 2021-11-28 DIAGNOSIS — I2510 Atherosclerotic heart disease of native coronary artery without angina pectoris: Secondary | ICD-10-CM | POA: Diagnosis not present

## 2021-11-28 DIAGNOSIS — Z23 Encounter for immunization: Secondary | ICD-10-CM | POA: Diagnosis not present

## 2021-11-28 DIAGNOSIS — E785 Hyperlipidemia, unspecified: Secondary | ICD-10-CM | POA: Diagnosis not present

## 2021-11-28 DIAGNOSIS — E1140 Type 2 diabetes mellitus with diabetic neuropathy, unspecified: Secondary | ICD-10-CM | POA: Diagnosis not present

## 2021-11-28 DIAGNOSIS — I10 Essential (primary) hypertension: Secondary | ICD-10-CM | POA: Diagnosis not present

## 2022-01-03 DIAGNOSIS — D1801 Hemangioma of skin and subcutaneous tissue: Secondary | ICD-10-CM | POA: Diagnosis not present

## 2022-01-03 DIAGNOSIS — L821 Other seborrheic keratosis: Secondary | ICD-10-CM | POA: Diagnosis not present

## 2022-01-03 DIAGNOSIS — C4441 Basal cell carcinoma of skin of scalp and neck: Secondary | ICD-10-CM | POA: Diagnosis not present

## 2022-01-03 DIAGNOSIS — L820 Inflamed seborrheic keratosis: Secondary | ICD-10-CM | POA: Diagnosis not present

## 2022-01-03 DIAGNOSIS — L578 Other skin changes due to chronic exposure to nonionizing radiation: Secondary | ICD-10-CM | POA: Diagnosis not present

## 2022-01-03 DIAGNOSIS — Z85828 Personal history of other malignant neoplasm of skin: Secondary | ICD-10-CM | POA: Diagnosis not present

## 2022-01-03 DIAGNOSIS — D485 Neoplasm of uncertain behavior of skin: Secondary | ICD-10-CM | POA: Diagnosis not present

## 2022-01-03 DIAGNOSIS — L814 Other melanin hyperpigmentation: Secondary | ICD-10-CM | POA: Diagnosis not present

## 2022-01-03 DIAGNOSIS — Z23 Encounter for immunization: Secondary | ICD-10-CM | POA: Diagnosis not present

## 2022-01-09 DIAGNOSIS — Z96641 Presence of right artificial hip joint: Secondary | ICD-10-CM | POA: Diagnosis not present

## 2022-01-09 DIAGNOSIS — Z471 Aftercare following joint replacement surgery: Secondary | ICD-10-CM | POA: Diagnosis not present

## 2022-01-09 DIAGNOSIS — M7051 Other bursitis of knee, right knee: Secondary | ICD-10-CM | POA: Diagnosis not present

## 2022-01-13 DIAGNOSIS — N401 Enlarged prostate with lower urinary tract symptoms: Secondary | ICD-10-CM | POA: Diagnosis not present

## 2022-01-13 DIAGNOSIS — E785 Hyperlipidemia, unspecified: Secondary | ICD-10-CM | POA: Diagnosis not present

## 2022-01-13 DIAGNOSIS — R3911 Hesitancy of micturition: Secondary | ICD-10-CM | POA: Diagnosis not present

## 2022-01-13 DIAGNOSIS — R972 Elevated prostate specific antigen [PSA]: Secondary | ICD-10-CM | POA: Diagnosis not present

## 2022-01-13 DIAGNOSIS — R3915 Urgency of urination: Secondary | ICD-10-CM | POA: Diagnosis not present

## 2022-01-15 DIAGNOSIS — C4441 Basal cell carcinoma of skin of scalp and neck: Secondary | ICD-10-CM | POA: Diagnosis not present

## 2022-01-22 DIAGNOSIS — T8149XA Infection following a procedure, other surgical site, initial encounter: Secondary | ICD-10-CM | POA: Diagnosis not present

## 2022-01-28 DIAGNOSIS — E1140 Type 2 diabetes mellitus with diabetic neuropathy, unspecified: Secondary | ICD-10-CM | POA: Diagnosis not present

## 2022-01-28 DIAGNOSIS — R972 Elevated prostate specific antigen [PSA]: Secondary | ICD-10-CM | POA: Diagnosis not present

## 2022-01-28 DIAGNOSIS — I2510 Atherosclerotic heart disease of native coronary artery without angina pectoris: Secondary | ICD-10-CM | POA: Diagnosis not present

## 2022-01-28 DIAGNOSIS — J309 Allergic rhinitis, unspecified: Secondary | ICD-10-CM | POA: Diagnosis not present

## 2022-01-28 DIAGNOSIS — R202 Paresthesia of skin: Secondary | ICD-10-CM | POA: Diagnosis not present

## 2022-01-28 DIAGNOSIS — Z0000 Encounter for general adult medical examination without abnormal findings: Secondary | ICD-10-CM | POA: Diagnosis not present

## 2022-01-28 DIAGNOSIS — L089 Local infection of the skin and subcutaneous tissue, unspecified: Secondary | ICD-10-CM | POA: Diagnosis not present

## 2022-01-28 DIAGNOSIS — I10 Essential (primary) hypertension: Secondary | ICD-10-CM | POA: Diagnosis not present

## 2022-01-28 DIAGNOSIS — I6523 Occlusion and stenosis of bilateral carotid arteries: Secondary | ICD-10-CM | POA: Diagnosis not present

## 2022-01-28 DIAGNOSIS — Z23 Encounter for immunization: Secondary | ICD-10-CM | POA: Diagnosis not present

## 2022-01-28 DIAGNOSIS — N401 Enlarged prostate with lower urinary tract symptoms: Secondary | ICD-10-CM | POA: Diagnosis not present

## 2022-02-12 DIAGNOSIS — C4441 Basal cell carcinoma of skin of scalp and neck: Secondary | ICD-10-CM | POA: Diagnosis not present

## 2022-02-27 DIAGNOSIS — Z85828 Personal history of other malignant neoplasm of skin: Secondary | ICD-10-CM | POA: Diagnosis not present

## 2022-02-27 DIAGNOSIS — Z5189 Encounter for other specified aftercare: Secondary | ICD-10-CM | POA: Diagnosis not present

## 2022-03-28 DIAGNOSIS — H11153 Pinguecula, bilateral: Secondary | ICD-10-CM | POA: Diagnosis not present

## 2022-03-28 DIAGNOSIS — E119 Type 2 diabetes mellitus without complications: Secondary | ICD-10-CM | POA: Diagnosis not present

## 2022-03-28 DIAGNOSIS — H5203 Hypermetropia, bilateral: Secondary | ICD-10-CM | POA: Diagnosis not present

## 2022-03-28 DIAGNOSIS — H2513 Age-related nuclear cataract, bilateral: Secondary | ICD-10-CM | POA: Diagnosis not present

## 2022-03-28 DIAGNOSIS — H35033 Hypertensive retinopathy, bilateral: Secondary | ICD-10-CM | POA: Diagnosis not present

## 2022-04-23 DIAGNOSIS — H2513 Age-related nuclear cataract, bilateral: Secondary | ICD-10-CM | POA: Diagnosis not present

## 2022-04-23 DIAGNOSIS — H2512 Age-related nuclear cataract, left eye: Secondary | ICD-10-CM | POA: Diagnosis not present

## 2022-04-29 DIAGNOSIS — E1140 Type 2 diabetes mellitus with diabetic neuropathy, unspecified: Secondary | ICD-10-CM | POA: Diagnosis not present

## 2022-04-29 DIAGNOSIS — I2510 Atherosclerotic heart disease of native coronary artery without angina pectoris: Secondary | ICD-10-CM | POA: Diagnosis not present

## 2022-04-29 DIAGNOSIS — I10 Essential (primary) hypertension: Secondary | ICD-10-CM | POA: Diagnosis not present

## 2022-04-29 DIAGNOSIS — E785 Hyperlipidemia, unspecified: Secondary | ICD-10-CM | POA: Diagnosis not present

## 2022-05-06 DIAGNOSIS — H269 Unspecified cataract: Secondary | ICD-10-CM | POA: Diagnosis not present

## 2022-05-06 DIAGNOSIS — H2189 Other specified disorders of iris and ciliary body: Secondary | ICD-10-CM | POA: Diagnosis not present

## 2022-05-06 DIAGNOSIS — H2512 Age-related nuclear cataract, left eye: Secondary | ICD-10-CM | POA: Diagnosis not present

## 2022-08-05 DIAGNOSIS — I10 Essential (primary) hypertension: Secondary | ICD-10-CM | POA: Diagnosis not present

## 2022-08-05 DIAGNOSIS — I2510 Atherosclerotic heart disease of native coronary artery without angina pectoris: Secondary | ICD-10-CM | POA: Diagnosis not present

## 2022-08-05 DIAGNOSIS — E1140 Type 2 diabetes mellitus with diabetic neuropathy, unspecified: Secondary | ICD-10-CM | POA: Diagnosis not present

## 2022-08-05 DIAGNOSIS — E785 Hyperlipidemia, unspecified: Secondary | ICD-10-CM | POA: Diagnosis not present

## 2022-09-10 ENCOUNTER — Ambulatory Visit: Payer: PPO | Admitting: Cardiology

## 2022-09-10 ENCOUNTER — Encounter: Payer: Self-pay | Admitting: Cardiology

## 2022-09-10 VITALS — BP 129/72 | HR 82 | Resp 17 | Ht 70.0 in | Wt 192.0 lb

## 2022-09-10 DIAGNOSIS — R931 Abnormal findings on diagnostic imaging of heart and coronary circulation: Secondary | ICD-10-CM | POA: Diagnosis not present

## 2022-09-10 DIAGNOSIS — I129 Hypertensive chronic kidney disease with stage 1 through stage 4 chronic kidney disease, or unspecified chronic kidney disease: Secondary | ICD-10-CM | POA: Diagnosis not present

## 2022-09-10 DIAGNOSIS — I4891 Unspecified atrial fibrillation: Secondary | ICD-10-CM | POA: Diagnosis not present

## 2022-09-10 DIAGNOSIS — E1122 Type 2 diabetes mellitus with diabetic chronic kidney disease: Secondary | ICD-10-CM | POA: Diagnosis not present

## 2022-09-10 DIAGNOSIS — I10 Essential (primary) hypertension: Secondary | ICD-10-CM

## 2022-09-10 DIAGNOSIS — N1831 Chronic kidney disease, stage 3a: Secondary | ICD-10-CM

## 2022-09-10 MED ORDER — APIXABAN 5 MG PO TABS: 5.0000 mg | ORAL_TABLET | Freq: Two times a day (BID) | ORAL | 3 refills | Status: AC

## 2022-09-10 NOTE — Patient Instructions (Signed)
Hold Metoprolol one day before the stress and day of the stress test

## 2022-09-10 NOTE — Progress Notes (Signed)
Primary Physician/Referring:  Merri Brunette, MD  Patient ID: Jose Higgins, male    DOB: 12-Jul-1940, 82 y.o.   MRN: [id number]  Chief Complaint  Patient presents with   Atrial Fibrillation   HPI:    Jose Higgins  is a 82 y.o. Caucasian male patient who is very active, asymptomatic and exercises regularly.  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    He had Covid 10 days ago however played golf normally without dyspnea or fatigue.  He was being screened into clinical trial for diabetes mellitus and overweight tenderness, he was found to be in new onset atrial fibrillation.  States that he is asymptomatic.  Past Medical History:  Diagnosis Date   Diabetes mellitus without complication (HCC)    Hypertension    Past Surgical History:  Procedure Laterality Date   CATARACT EXTRACTION Left    hip replacment     Family History  Problem Relation Age of Onset   Emphysema Mother    Heart failure Father    Heart attack Father 7    Social History   Tobacco Use   Smoking status: Never   Smokeless tobacco: Never  Substance Use Topics   Alcohol use: Never   Marital Status: Married  ROS  Review of Systems  Cardiovascular:  Negative for chest pain, dyspnea on exertion and leg swelling.   Objective  Blood pressure 129/72, pulse 82, resp. rate 17, height 5\' 10"  (1.778 m), weight 192 lb (87.1 kg), SpO2 96%. Body mass index is 27.55 kg/m.     09/10/2022    1:27 PM 04/25/2021    2:44 PM 03/14/2021    1:22 PM  Vitals with BMI  Height 5\' 10"  5\' 10"  5\' 10"   Weight 192 lbs 197 lbs 202 lbs 6 oz  BMI 27.55 28.27 29.04  Systolic 129 126 782  Diastolic 72 73 69  Pulse 82 72 61    Physical Exam Neck:     Vascular: No JVD.  Cardiovascular:     Rate and Rhythm: Normal rate. Rhythm irregular.     Pulses: Intact distal pulses.          Carotid pulses are  on the  right side with bruit and  on the left side with bruit.    Heart sounds: S1 normal and S2 normal. Murmur heard.     Early systolic murmur is present with a grade of 2/6 at the upper right sternal border.     No gallop.  Pulmonary:     Effort: Pulmonary effort is normal.     Breath sounds: Normal breath sounds.  Abdominal:     General: Bowel sounds are normal.     Palpations: Abdomen is soft.  Musculoskeletal:     Right lower leg: No edema.     Left lower leg: No edema.    Laboratory examination:   External labs:   A1C 7.500 08/05/2022  Labs 01/10/2021:  Total cholesterol 140, triglycerides 89, HDL 54, LDL 68.  Hb 15.0/HCT 45.4, platelets 170.  Normal indicis.  Sodium 139, potassium 4.6, BUN 10, creatinine 1.21, EGFR 56 mL / 64 mL.  Labs 08/20/2020:  A1c 7.4%.  Radiology:   NA  Cardiac Studies:   Coronary calcium score 02/18/2021: CORONARY CALCIUM SCORES: Left Main: 0 LAD: 653 LCx: 181 RCA: 167 Total Agatston Score: 1002.  MESA database percentile: 42 Ascending  and descending thoracic aortic measurements normal.  Aortic valve calcification, correlate with echocardiogram.  No significant extracardiac abnormality.  Carotid artery duplex 03/29/2021:  No hemodynamically significant arterial disease in the internal carotid  artery bilaterally. Minimal heterogeneous plaque noted in the carotid  arteries. Left carotid artery demonstrates tortuosity and may be source of  bruit.  Antegrade right vertebral artery flow. Antegrade left vertebral artery flow.  PCV ECHOCARDIOGRAM COMPLETE 03/29/2021  Normal LV systolic function with visual EF 60-65%. Left ventricle cavity is normal in size. Normal left ventricular wall thickness. Normal global wall motion. Indeterminate diastolic filling pattern, normal LAP. Left atrial cavity is mildly dilated. Trileaflet aortic valve. Mild aortic stenosis.  Peak velocity 2.19m/s, Peak Pressure Gradient 32 mmHg, Mean Gradient 17 mmHg, AVA *1.51  cm, DI 0.40. Trace aortic regurgitation. Mild tricuspid regurgitation. No evidence of pulmonary hypertension. RVSP measures 31 mmHg. Mild pulmonic regurgitation. No prior study for comparison.    EKG:   EKG 09/10/2022: Atrial fibrillation with controlled ventricular sponsor at the rate of 75 bpm, nonspecific T abnormality.  Compared to 03/14/2021, sinus rhythm has been replaced.  Medications and allergies   No Known Allergies  Current Outpatient Medications:    acetaminophen (TYLENOL) 325 MG tablet, Take 650 mg by mouth every 6 (six) hours as needed for moderate pain or fever., Disp: , Rfl:    alfuzosin (UROXATRAL) 10 MG 24 hr tablet, Take 10 mg by mouth daily with breakfast., Disp: , Rfl:    apixaban (ELIQUIS) 5 MG TABS tablet, Take 1 tablet (5 mg total) by mouth 2 (two) times daily., Disp: 180 tablet, Rfl: 3   atorvastatin (LIPITOR) 20 MG tablet, Take 1 tablet (20 mg total) by mouth daily., Disp:  , Rfl:    b complex vitamins capsule, Take 1 capsule by mouth 2 (two) times a week., Disp: , Rfl:    calcium carbonate (OSCAL) 1500 (600 Ca) MG TABS tablet, Take 1,500 mg by mouth 2 (two) times daily with a meal., Disp: , Rfl:    cetirizine (ZYRTEC) 10 MG tablet, Take 10 mg by mouth daily., Disp: , Rfl:    co-enzyme Q-10 30 MG capsule, Take 100 mg by mouth 2 (two) times daily., Disp: , Rfl:    Empagliflozin-metFORMIN HCl ER (SYNJARDY XR) 12.05-998 MG TB24, Take 12.5 tablets by mouth daily., Disp: , Rfl:    ipratropium (ATROVENT) 0.06 % nasal spray, Place 2 sprays into both nostrils 3 (three) times daily as needed for rhinitis. , Disp: , Rfl:    irbesartan (AVAPRO) 75 MG tablet, Take 75 mg by mouth daily., Disp: , Rfl:    MAGNESIUM PO, Take 100 mg by mouth in the morning and at bedtime., Disp: , Rfl:    metoprolol succinate (TOPROL-XL) 50 MG 24 hr tablet, Take 1 tablet by mouth daily., Disp: , Rfl:    Omega-3 Fatty Acids (FISH OIL) 1000 MG CPDR, Take 1 capsule by mouth in the morning and at  bedtime., Disp: , Rfl:   Assessment     ICD-10-CM   1. New onset atrial fibrillation (HCC)  I48.91 PCV ECHOCARDIOGRAM COMPLETE    PCV MYOCARDIAL PERFUSION WO LEXISCAN    apixaban (ELIQUIS) 5 MG TABS tablet    2. Primary hypertension  I10 EKG 12-Lead    3. Elevated coronary artery calcium score 02/18/2021: Total Agatston Score: 1002.  MESA database percentile: 69  R93.1 PCV MYOCARDIAL PERFUSION WO LEXISCAN    4. Type 2 diabetes mellitus with stage 3a chronic kidney disease, without long-term  current use of insulin (HCC)  E11.22 PCV MYOCARDIAL PERFUSION WO LEXISCAN   N18.31       CHA2DS2-VASc Score is 5.  Yearly risk of stroke: 7% (A, HTN, DM, Vasc Dz).  Score of 1=0.6; 2=2.2; 3=3.2; 4=4.8; 5=7.2; 6=9.8; 7=>9.8) -(CHF; HTN; vasc disease DM,  Male = 1; Age <65 =0; 65-74 = 1,  >75 =2; stroke/embolism= 2).    Meds ordered this encounter  Medications   apixaban (ELIQUIS) 5 MG TABS tablet    Sig: Take 1 tablet (5 mg total) by mouth 2 (two) times daily.    Dispense:  180 tablet    Refill:  3   Orders Placed This Encounter  Procedures   PCV MYOCARDIAL PERFUSION WO LEXISCAN    Standing Status:   Future    Standing Expiration Date:   11/10/2022   EKG 12-Lead   PCV ECHOCARDIOGRAM COMPLETE    Standing Status:   Future    Standing Expiration Date:   09/10/2023   Recommendations:   Jose Higgins is a 82 y.o. Caucasian male patient who is very active, asymptomatic and exercises regularly.  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    He had Covid 10 days ago however played golf normally without dyspnea or fatigue.  He was being screened into clinical trial for diabetes mellitus and overweight tenderness, he was found to be in new onset atrial fibrillation.  States that he is asymptomatic.  1. New onset atrial fibrillation (HCC) Patient is clearly in atrial  fibrillation with controlled ventricular response, with a recent COVID infection Lactrase versus ischemic chest HLH and atrial fibrillation.  His CHA2DS2-VASc score is very high we will start him on Eliquis for hypercoagulable state and prevention of cardioembolic phenomena.  - PCV ECHOCARDIOGRAM COMPLETE; Future - PCV MYOCARDIAL PERFUSION WO LEXISCAN; Future - apixaban (ELIQUIS) 5 MG TABS tablet; Take 1 tablet (5 mg total) by mouth 2 (two) times daily.  Dispense: 180 tablet; Refill: 3  2. Primary hypertension Blood pressure is well-controlled not on ARB and metoprolol, heart rate is also well-controlled, continue same. - EKG 12-Lead  3. Elevated coronary artery calcium score 02/18/2021: Total Agatston Score: 1002.  MESA database percentile: 56 Although patient asymptomatic currently 17 percentile a nuclear stress test has been ordered to exclude ischemic etiology for his new onset atrial fibrillation. - PCV MYOCARDIAL PERFUSION WO LEXISCAN; Future  4. Type 2 diabetes mellitus with stage 3a chronic kidney disease, without long-term current use of insulin (HCC) His major cardiovascular is factors include diabetes mellitus which is well-controlled along with stage IIIa chronic kidney disease.  External labs reviewed.  Like to see him back in 4 weeks for follow-up.  Although appears to be asymptomatic, with new onset of atrial fibrillation, although COVID infection could have caused him to have atrial fibrillation, he will probably need long-term anticoagulation in view of his significant cardiovascular risk factors.  I need his recent labs.   - PCV MYOCARDIAL PERFUSION WO LEXISCAN; Future  Other orders - MAGNESIUM PO; Take 100 mg by mouth in the morning and at bedtime.    Jose Decamp, MD, [facility] 09/10/2022, 7:24 PM Office: [phone]

## 2022-09-17 ENCOUNTER — Encounter: Payer: Self-pay | Admitting: Podiatry

## 2022-09-17 ENCOUNTER — Ambulatory Visit: Payer: PPO | Admitting: Podiatry

## 2022-09-17 DIAGNOSIS — B351 Tinea unguium: Secondary | ICD-10-CM | POA: Diagnosis not present

## 2022-09-17 DIAGNOSIS — L600 Ingrowing nail: Secondary | ICD-10-CM | POA: Diagnosis not present

## 2022-09-17 NOTE — Progress Notes (Signed)
Subjective:   Patient ID: Jose Higgins, male   DOB: 82 y.o.   MRN: [id number]   HPI Patient presents with a deformed nailbed of hallux right over left with a right hallux being incurvated in the medial border painful when pressed has tried to trim and soak it without relief.  Diabetes under good control overall good health.  Patient does not smoke and likes to be active   Review of Systems  All other systems reviewed and are negative.       Objective:  Physical Exam Vitals and nursing note reviewed.  Constitutional:      Appearance: He is well-developed.  Pulmonary:     Effort: Pulmonary effort is normal.  Musculoskeletal:        General: Normal range of motion.  Skin:    General: Skin is warm.  Neurological:     Mental Status: He is alert.     Neurovascular status intact muscle strength was found to be adequate range of motion adequate with discoloration of the nailbeds bilateral and incurvation with deformity of the hallux right over left with pain in the medial border.  Good digital perfusion well-oriented     Assessment:  Combination of structural damage to the nailbed ingrown toenail and mycotic nail infection eft H&P reviewed all elements of this and were going to focus on the painful nailbed and correction.  I explained procedure risk patient wants surgery and signed consent form and I infiltrated the right big toe 60 mg Xylocaine Marcaine mixture sterile prep done using sterile instrumentation remove the medial border exposed matrix applied phenol 3 applications 30 seconds followed by alcohol lavage sterile dressing gave instructions on soaks wearing dressings 4 hours take it off earlier if throbbing were to occur     Plan:  Above describes the procedure

## 2022-09-17 NOTE — Patient Instructions (Signed)

## 2022-09-18 ENCOUNTER — Telehealth: Payer: Self-pay | Admitting: Podiatry

## 2022-09-18 NOTE — Telephone Encounter (Signed)
Patient's wife called, stating her husband had a toenail avulsion procedure yesterday by Dr. Charlsie Merles.  They went to remove his dressing today and the toe keeps bleeding.  He's on a blood thinner.    Advised patient to apply a pressure dressing for 20 minutes with the foot elevated, then gently remove it to see if the bleeding has stopped.  If not, and it continues to bleed, call the office so we can bring him in to try and cauterize it.

## 2022-10-07 DIAGNOSIS — L570 Actinic keratosis: Secondary | ICD-10-CM | POA: Diagnosis not present

## 2022-10-07 DIAGNOSIS — C44712 Basal cell carcinoma of skin of right lower limb, including hip: Secondary | ICD-10-CM | POA: Diagnosis not present

## 2022-10-07 DIAGNOSIS — D485 Neoplasm of uncertain behavior of skin: Secondary | ICD-10-CM | POA: Diagnosis not present

## 2022-10-07 DIAGNOSIS — L821 Other seborrheic keratosis: Secondary | ICD-10-CM | POA: Diagnosis not present

## 2022-10-22 DIAGNOSIS — H2511 Age-related nuclear cataract, right eye: Secondary | ICD-10-CM | POA: Diagnosis not present

## 2022-10-22 DIAGNOSIS — H35033 Hypertensive retinopathy, bilateral: Secondary | ICD-10-CM | POA: Diagnosis not present

## 2022-10-22 DIAGNOSIS — E119 Type 2 diabetes mellitus without complications: Secondary | ICD-10-CM | POA: Diagnosis not present

## 2022-10-22 DIAGNOSIS — H11153 Pinguecula, bilateral: Secondary | ICD-10-CM | POA: Diagnosis not present

## 2022-10-24 ENCOUNTER — Other Ambulatory Visit: Payer: Self-pay | Admitting: Cardiology

## 2022-10-24 DIAGNOSIS — I4891 Unspecified atrial fibrillation: Secondary | ICD-10-CM

## 2022-10-24 DIAGNOSIS — E1122 Type 2 diabetes mellitus with diabetic chronic kidney disease: Secondary | ICD-10-CM

## 2022-10-24 DIAGNOSIS — R931 Abnormal findings on diagnostic imaging of heart and coronary circulation: Secondary | ICD-10-CM

## 2022-10-28 ENCOUNTER — Telehealth (HOSPITAL_COMMUNITY): Payer: Self-pay

## 2022-10-28 NOTE — Telephone Encounter (Signed)
Spoke with the patient, detailed instructions given. He stated that he would be here for his test. Asked to call back with any questions.S.Buena Boehm CCT

## 2022-11-04 ENCOUNTER — Ambulatory Visit (HOSPITAL_COMMUNITY): Payer: PPO | Attending: Cardiology

## 2022-11-04 ENCOUNTER — Ambulatory Visit (HOSPITAL_COMMUNITY): Payer: PPO

## 2022-11-04 DIAGNOSIS — E1140 Type 2 diabetes mellitus with diabetic neuropathy, unspecified: Secondary | ICD-10-CM | POA: Diagnosis not present

## 2022-11-04 DIAGNOSIS — E7800 Pure hypercholesterolemia, unspecified: Secondary | ICD-10-CM | POA: Diagnosis not present

## 2022-11-04 DIAGNOSIS — I4891 Unspecified atrial fibrillation: Secondary | ICD-10-CM | POA: Insufficient documentation

## 2022-11-04 DIAGNOSIS — I10 Essential (primary) hypertension: Secondary | ICD-10-CM | POA: Diagnosis not present

## 2022-11-04 DIAGNOSIS — I2510 Atherosclerotic heart disease of native coronary artery without angina pectoris: Secondary | ICD-10-CM | POA: Diagnosis not present

## 2022-11-04 LAB — ECHOCARDIOGRAM COMPLETE
AR max vel: 1.68 cm2
AV Area VTI: 1.69 cm2
AV Area mean vel: 1.56 cm2
AV Mean grad: 25.5 mm[Hg]
AV Peak grad: 43.1 mm[Hg]
Ao pk vel: 3.28 m/s
Area-P 1/2: 4.17 cm2
P 1/2 time: 898 ms
S' Lateral: 2.1 cm

## 2022-11-05 ENCOUNTER — Encounter (HOSPITAL_COMMUNITY): Payer: PPO

## 2022-11-05 ENCOUNTER — Encounter (HOSPITAL_COMMUNITY): Payer: Self-pay

## 2022-11-07 ENCOUNTER — Ambulatory Visit (HOSPITAL_COMMUNITY): Payer: PPO | Attending: Internal Medicine

## 2022-11-07 DIAGNOSIS — E1122 Type 2 diabetes mellitus with diabetic chronic kidney disease: Secondary | ICD-10-CM | POA: Diagnosis not present

## 2022-11-07 DIAGNOSIS — I4891 Unspecified atrial fibrillation: Secondary | ICD-10-CM | POA: Diagnosis not present

## 2022-11-07 DIAGNOSIS — N1831 Chronic kidney disease, stage 3a: Secondary | ICD-10-CM | POA: Diagnosis not present

## 2022-11-07 DIAGNOSIS — R931 Abnormal findings on diagnostic imaging of heart and coronary circulation: Secondary | ICD-10-CM | POA: Diagnosis not present

## 2022-11-07 LAB — MYOCARDIAL PERFUSION IMAGING
LV dias vol: 105 mL (ref 62–150)
LV sys vol: 38 mL
Nuc Stress EF: 64 %
Peak HR: 88 {beats}/min
Rest HR: 78 {beats}/min
Rest Nuclear Isotope Dose: 10.8 mCi
SDS: 2
SRS: 0
SSS: 2
ST Depression (mm): 0 mm
Stress Nuclear Isotope Dose: 32.8 mCi
TID: 1.04

## 2022-11-07 MED ORDER — TECHNETIUM TC 99M TETROFOSMIN IV KIT
32.8000 | PACK | Freq: Once | INTRAVENOUS | Status: AC | PRN
  Administered 2022-11-07: 32.8 via INTRAVENOUS

## 2022-11-07 MED ORDER — REGADENOSON 0.4 MG/5ML IV SOLN
0.4000 mg | Freq: Once | INTRAVENOUS | Status: AC
  Administered 2022-11-07: 0.4 mg via INTRAVENOUS

## 2022-11-07 MED ORDER — TECHNETIUM TC 99M TETROFOSMIN IV KIT
10.8000 | PACK | Freq: Once | INTRAVENOUS | Status: AC | PRN
  Administered 2022-11-07: 10.8 via INTRAVENOUS

## 2022-11-14 ENCOUNTER — Telehealth: Payer: Self-pay | Admitting: Cardiology

## 2022-11-14 NOTE — Telephone Encounter (Signed)
During stress test also he was in A. Fib. To continue present management

## 2022-11-14 NOTE — Telephone Encounter (Signed)
Pre-operative Risk Assessment    Patient Name: Jose Higgins  DOB: [birth year] MRN: [id number]      Request for Surgical Clearance    Procedure:   Mohs Surgery  Date of Surgery:  Clearance 11/19/22                                 Surgeon:  Dr. Odie Sera Surgeon's Group or Practice Name:  [facility] Phone number:  [phone] Fax number:  [phone]   Type of Clearance Requested:   - Medical    Type of Anesthesia:  Local  - Lidocaine w/ Epinephrine   Additional requests/questions:  Please advise surgeon/provider what medications should be held.  Signed, Belisicia T Woods   11/14/2022, 1:21 PM

## 2022-11-14 NOTE — Telephone Encounter (Signed)
Attempted to call requesting office but they close at 2pm on Fridays. Will try again on Monday

## 2022-11-14 NOTE — Telephone Encounter (Signed)
Pt would like a callback to discuss Stress Test results.

## 2022-11-14 NOTE — Telephone Encounter (Signed)
Spoke with patient and he is aware of stress test results.   He is confused because when he was getting echo he was told he did have AFIB. However when he was doing stress test he was told he was in AFIB but its light and he probably  do not feel it.Marland Kitchen He would like to know why did the Echo tech tell him he did not have AFIB?  Confirmed he is taking medications as prescribed and advised to continue to take as prescribed. Stay hydrated and monitor heart rate. Discussed ED precautions.   He then state he is not currently in AFIB or experiencing any symptoms.  Advised to give [place] a call if he does go back into AFIB.   He is having 2 procedures and wil have office fax over clearance forms

## 2022-11-14 NOTE — Telephone Encounter (Signed)
Pre-op team,   Will you please clarify with requesting office if patient needs to hold Eliquis prior to procedure?   Thank you!  Etta Grandchild. Koreen Lizaola, DNP, NP-C  11/14/2022, 1:34 PM [facility] [address] Office [phone] Fax [phone]

## 2022-11-17 ENCOUNTER — Telehealth: Payer: Self-pay | Admitting: *Deleted

## 2022-11-17 DIAGNOSIS — J40 Bronchitis, not specified as acute or chronic: Secondary | ICD-10-CM | POA: Diagnosis not present

## 2022-11-17 NOTE — Telephone Encounter (Signed)
Our office has tried x 2 to reach the dermatology office to confirm if Eliquis will need to be held. Left message to call back today 10/21 as procedure is set for 11/19/22.

## 2022-11-17 NOTE — Telephone Encounter (Signed)
Pt has been scheduled for tele pre op appt 11/18/22 3 pm add on ok per Edd Fabian, FNP. Add on due to procedure date.    Pt also tells me that he is supposed to have cataract surgery 12/02/22 with Dr. Sharl Ma. Pt asked if we received the form yet for clearance. I stated no we have not. I did inform the pt to call ASAP Dr. Signe Colt office and have them fax request STAT today so we can try and get both procedures cleared in the one tele visit. Pt thanked me for the help and suggestions.    Med rec and consent are done.

## 2022-11-17 NOTE — Telephone Encounter (Signed)
Pt has been scheduled for tele pre op appt 11/18/22 3 pm add on ok per Edd Fabian, FNP. Add on due to procedure date.   Pt also tells me that he is supposed to have cataract surgery 12/02/22 with Dr. Sharl Ma. Pt asked if we received the form yet for clearance. I stated no we have not. I did inform the pt to call ASAP Dr. Signe Colt office and have them fax request STAT today so we can try and get both procedures cleared in the one tele visit. Pt thanked me for the help and suggestions.   Med rec and consent are done.     Patient Consent for Virtual Visit        Jose Higgins has provided verbal consent on 11/17/2022 for a virtual visit (video or telephone).   CONSENT FOR VIRTUAL VISIT FOR:  Jose Higgins  By participating in this virtual visit I agree to the following:  I hereby voluntarily request, consent and authorize [facility] and its employed or contracted physicians, physician assistants, nurse practitioners or other licensed health care professionals (the Practitioner), to provide me with telemedicine health care services (the "Services") as deemed necessary by the treating Practitioner. I acknowledge and consent to receive the Services by the Practitioner via telemedicine. I understand that the telemedicine visit will involve communicating with the Practitioner through live audiovisual communication technology and the disclosure of certain medical information by electronic transmission. I acknowledge that I have been given the opportunity to request an in-person assessment or other available alternative prior to the telemedicine visit and am voluntarily participating in the telemedicine visit.  I understand that I have the right to withhold or withdraw my consent to the use of telemedicine in the course of my care at any time, without affecting my right to future care or treatment, and that the Practitioner or I may terminate the telemedicine visit at any time. I understand that  I have the right to inspect all information obtained and/or recorded in the course of the telemedicine visit and may receive copies of available information for a reasonable fee.  I understand that some of the potential risks of receiving the Services via telemedicine include:  Delay or interruption in medical evaluation due to technological equipment failure or disruption; Information transmitted may not be sufficient (e.g. poor resolution of images) to allow for appropriate medical decision making by the Practitioner; and/or  In rare instances, security protocols could fail, causing a breach of personal health information.  Furthermore, I acknowledge that it is my responsibility to provide information about my medical history, conditions and care that is complete and accurate to the best of my ability. I acknowledge that Practitioner's advice, recommendations, and/or decision may be based on factors not within their control, such as incomplete or inaccurate data provided by me or distortions of diagnostic images or specimens that may result from electronic transmissions. I understand that the practice of medicine is not an exact science and that Practitioner makes no warranties or guarantees regarding treatment outcomes. I acknowledge that a copy of this consent can be made available to me via my patient portal [facility] MyChart), or I can request a printed copy by calling the office of [facility].    I understand that my insurance will be billed for this visit.   I have read or had this consent read to me. I understand the contents of this consent, which adequately explains the benefits and risks of the  Services being provided via telemedicine.  I have been provided ample opportunity to ask questions regarding this consent and the Services and have had my questions answered to my satisfaction. I give my informed consent for the services to be provided through the use of telemedicine in  my medical care

## 2022-11-17 NOTE — Telephone Encounter (Signed)
S/w the dermatology office and confirmed Eliquis does not need to be held.

## 2022-11-17 NOTE — Telephone Encounter (Signed)
Name: Jose Higgins  DOB: 04-03-1940  MRN: [id number]  Primary Cardiologist: None   Preoperative team, please contact this patient and set up a phone call appointment for further preoperative risk assessment. Please obtain consent and complete medication review. Thank you for your help.  I confirm that guidance regarding antiplatelet and oral anticoagulation therapy has been completed and, if necessary, noted below.   I also confirmed the patient resides in the state of [place]. As per [facility] Medical Board telemedicine laws, the patient must reside in the state in which the provider is licensed.   Ronney Asters, NP 11/17/2022, 10:48 AM [facility]

## 2022-11-17 NOTE — Telephone Encounter (Signed)
Pre-operative Risk Assessment    Patient Name: Jose Higgins  DOB: Jun 21, 1940 MRN: [id number]    DATE OF LAST VISIT: 09/10/22 DR. Jacinto Halim DATE OF NEXT VISIT: 11/18/22 TELE VISIT FOR ANOTHER PROCEDURE   Request for Surgical Clearance    Procedure:   CATARACT SURGERY RIGHT EYE  Date of Surgery:  Clearance 12/09/22                                 Surgeon:  DR. Hazle Quant Surgeon's Group or Practice Name:  [facility]  Phone number:  [phone] Fax number:  [phone]   Type of Clearance Requested:   - Medical ; PER CLEARANCE FORM PT DOES NOT NEED TO HOLD ANY BLOOD THINNERS   Type of Anesthesia:  Not Indicated   Additional requests/questions:    Elpidio Anis   11/17/2022, 1:40 PM

## 2022-11-17 NOTE — Telephone Encounter (Signed)
Patient Name: Jose Higgins  DOB: 23-Sep-1940 MRN: [id number]  Primary Cardiologist: Dr. Jacinto Halim  Chart reviewed as part of pre-operative protocol coverage. Cataract extractions are recognized in guidelines as low risk surgeries that do not typically require specific preoperative testing or holding of blood thinner therapy. Therefore, given past medical history and time since last visit, based on ACC/AHA guidelines, [name] would be at acceptable risk for the planned procedure without further cardiovascular testing.   I will route this recommendation to the requesting party via Epic fax function and remove from pre-op pool.  Please call with questions.  Ronney Asters, NP 11/17/2022, 1:54 PM

## 2022-11-18 ENCOUNTER — Ambulatory Visit: Payer: PPO | Attending: Cardiology

## 2022-11-18 DIAGNOSIS — Z01810 Encounter for preprocedural cardiovascular examination: Secondary | ICD-10-CM | POA: Diagnosis not present

## 2022-11-18 NOTE — Progress Notes (Signed)
Virtual Visit via Telephone Note   Because of Jose Higgins's co-morbid illnesses, he is at least at moderate risk for complications without adequate follow up.  This format is felt to be most appropriate for this patient at this time.  The patient did not have access to video technology/had technical difficulties with video requiring transitioning to audio format only (telephone).  All issues noted in this document were discussed and addressed.  No physical exam could be performed with this format.  Please refer to the patient's chart for his consent to telehealth for [facility].  Evaluation Performed:  Preoperative cardiovascular risk assessment _____________   Date:  11/18/2022   Patient ID:  Jose Higgins, DOB 1940-08-21, MRN [id number] Patient Location:  Home Provider location:   Office  Primary Care Provider:  Merri Brunette, MD Primary Cardiologist:  None  Chief Complaint / Patient Profile   82 y.o. y/o male with a h/o atrial fibrillation, elevated coronary artery calcium score, type 2 diabetes who is pending Mohs surgery and presents today for telephonic preoperative cardiovascular risk assessment.  History of Present Illness    Jose Higgins is a 82 y.o. male who presents via Web designer for a telehealth visit today.  Pt was last seen in cardiology clinic on 09/10/2022 by Dr Jacinto Halim.  At that time Jose Higgins was doing well.  The patient is now pending procedure as outlined above. Since his last visit, he remains stable from a cardiac standpoint.  Today he denies chest pain, shortness of breath, lower extremity edema, fatigue, palpitations, melena, hematuria, hemoptysis, diaphoresis, weakness, presyncope, syncope, orthopnea, and PND.   Past Medical History    Past Medical History:  Diagnosis Date   Diabetes mellitus without complication (HCC)    Hypertension    Past Surgical History:  Procedure Laterality Date   CATARACT EXTRACTION Left    hip  replacment      Allergies  No Known Allergies  Home Medications    Prior to Admission medications   Medication Sig Start Date End Date Taking? Authorizing Provider  acetaminophen (TYLENOL) 325 MG tablet Take 650 mg by mouth every 6 (six) hours as needed for moderate pain or fever.    [provider]  alfuzosin (UROXATRAL) 10 MG 24 hr tablet Take 10 mg by mouth daily with breakfast.    [provider]  apixaban (ELIQUIS) 5 MG TABS tablet Take 1 tablet (5 mg total) by mouth 2 (two) times daily. 09/10/22   Yates Decamp, MD  atorvastatin (LIPITOR) 20 MG tablet Take 1 tablet (20 mg total) by mouth daily. 02/25/19   Kathlen Mody, MD  b complex vitamins capsule Take 1 capsule by mouth 2 (two) times a week.    [provider]  calcium carbonate (OSCAL) 1500 (600 Ca) MG TABS tablet Take 1,500 mg by mouth 2 (two) times daily with a meal.    [provider]  cetirizine (ZYRTEC) 10 MG tablet Take 10 mg by mouth daily.    [provider]  co-enzyme Q-10 30 MG capsule Take 100 mg by mouth 2 (two) times daily.    [provider]  Empagliflozin-metFORMIN HCl ER (SYNJARDY XR) 12.05-998 MG TB24 Take 12.5 tablets by mouth daily. 08/21/20   [provider]  ipratropium (ATROVENT) 0.06 % nasal spray Place 2 sprays into both nostrils 3 (three) times daily as needed for rhinitis.  12/30/18   [provider]  irbesartan (AVAPRO) 75 MG tablet Take 75 mg  by mouth daily. 10/08/18   [provider]  MAGNESIUM PO Take 100 mg by mouth in the morning and at bedtime. 01/09/22   [provider]  metoprolol succinate (TOPROL-XL) 50 MG 24 hr tablet Take 1 tablet by mouth daily. 10/23/19   [provider]  Omega-3 Fatty Acids (FISH OIL) 1000 MG CPDR Take 1 capsule by mouth in the morning and at bedtime.    [provider]    Physical Exam    Vital Signs:  Jose Higgins does not have vital signs available for review  today.  Given telephonic nature of communication, physical exam is limited. AAOx3. NAD. Normal affect.  Speech and respirations are unlabored.  Accessory Clinical Findings    None  Assessment & Plan    1.  Preoperative Cardiovascular Risk Assessment: Mohs surgery, 11/19/2022, Dr. Odie Sera[facility], fax #[phone]      Primary Cardiologist: Dr. Jacinto Halim  Chart reviewed as part of pre-operative protocol coverage. Given past medical history and time since last visit, based on ACC/AHA guidelines, Jose Higgins would be at acceptable risk for the planned procedure without further cardiovascular testing.   Patient was advised that if he/she develops new symptoms prior to surgery to contact our office to arrange a follow-up appointment.  He verbalized understanding.  I will route this recommendation to the requesting party via Epic fax function and remove from pre-op pool.       Time:   Today, I have spent 5 minutes with the patient with telehealth technology discussing medical history, symptoms, and management plan.  Prior to patient's phone evaluation I spent greater than 10 minutes reviewing their past medical history and cardiac medications.    Ronney Asters, NP  11/18/2022, 7:13 AM

## 2022-11-19 DIAGNOSIS — C44712 Basal cell carcinoma of skin of right lower limb, including hip: Secondary | ICD-10-CM | POA: Diagnosis not present

## 2022-11-25 DIAGNOSIS — J069 Acute upper respiratory infection, unspecified: Secondary | ICD-10-CM | POA: Diagnosis not present

## 2022-11-25 DIAGNOSIS — H2511 Age-related nuclear cataract, right eye: Secondary | ICD-10-CM | POA: Diagnosis not present

## 2023-01-13 DIAGNOSIS — H2511 Age-related nuclear cataract, right eye: Secondary | ICD-10-CM | POA: Diagnosis not present

## 2023-01-14 DIAGNOSIS — D2272 Melanocytic nevi of left lower limb, including hip: Secondary | ICD-10-CM | POA: Diagnosis not present

## 2023-01-14 DIAGNOSIS — L570 Actinic keratosis: Secondary | ICD-10-CM | POA: Diagnosis not present

## 2023-01-14 DIAGNOSIS — Z85828 Personal history of other malignant neoplasm of skin: Secondary | ICD-10-CM | POA: Diagnosis not present

## 2023-01-14 DIAGNOSIS — L578 Other skin changes due to chronic exposure to nonionizing radiation: Secondary | ICD-10-CM | POA: Diagnosis not present

## 2023-01-14 DIAGNOSIS — L821 Other seborrheic keratosis: Secondary | ICD-10-CM | POA: Diagnosis not present

## 2023-01-14 DIAGNOSIS — L814 Other melanin hyperpigmentation: Secondary | ICD-10-CM | POA: Diagnosis not present

## 2023-01-26 DIAGNOSIS — E785 Hyperlipidemia, unspecified: Secondary | ICD-10-CM | POA: Diagnosis not present

## 2023-01-26 DIAGNOSIS — I10 Essential (primary) hypertension: Secondary | ICD-10-CM | POA: Diagnosis not present

## 2023-02-02 DIAGNOSIS — I6523 Occlusion and stenosis of bilateral carotid arteries: Secondary | ICD-10-CM | POA: Diagnosis not present

## 2023-02-02 DIAGNOSIS — I4891 Unspecified atrial fibrillation: Secondary | ICD-10-CM | POA: Diagnosis not present

## 2023-02-02 DIAGNOSIS — N401 Enlarged prostate with lower urinary tract symptoms: Secondary | ICD-10-CM | POA: Diagnosis not present

## 2023-02-02 DIAGNOSIS — R972 Elevated prostate specific antigen [PSA]: Secondary | ICD-10-CM | POA: Diagnosis not present

## 2023-02-02 DIAGNOSIS — I10 Essential (primary) hypertension: Secondary | ICD-10-CM | POA: Diagnosis not present

## 2023-02-02 DIAGNOSIS — E1140 Type 2 diabetes mellitus with diabetic neuropathy, unspecified: Secondary | ICD-10-CM | POA: Diagnosis not present

## 2023-02-02 DIAGNOSIS — Z0000 Encounter for general adult medical examination without abnormal findings: Secondary | ICD-10-CM | POA: Diagnosis not present

## 2023-02-02 DIAGNOSIS — H6123 Impacted cerumen, bilateral: Secondary | ICD-10-CM | POA: Diagnosis not present

## 2023-02-02 DIAGNOSIS — J309 Allergic rhinitis, unspecified: Secondary | ICD-10-CM | POA: Diagnosis not present

## 2023-04-15 DIAGNOSIS — R3915 Urgency of urination: Secondary | ICD-10-CM | POA: Diagnosis not present

## 2023-04-15 DIAGNOSIS — R351 Nocturia: Secondary | ICD-10-CM | POA: Diagnosis not present

## 2023-04-15 DIAGNOSIS — N401 Enlarged prostate with lower urinary tract symptoms: Secondary | ICD-10-CM | POA: Diagnosis not present

## 2023-05-28 DIAGNOSIS — Z7901 Long term (current) use of anticoagulants: Secondary | ICD-10-CM | POA: Diagnosis not present

## 2023-05-28 DIAGNOSIS — I4891 Unspecified atrial fibrillation: Secondary | ICD-10-CM | POA: Diagnosis not present

## 2023-05-28 DIAGNOSIS — E1140 Type 2 diabetes mellitus with diabetic neuropathy, unspecified: Secondary | ICD-10-CM | POA: Diagnosis not present

## 2023-05-28 DIAGNOSIS — I10 Essential (primary) hypertension: Secondary | ICD-10-CM | POA: Diagnosis not present

## 2023-05-28 DIAGNOSIS — E7800 Pure hypercholesterolemia, unspecified: Secondary | ICD-10-CM | POA: Diagnosis not present

## 2023-07-15 DIAGNOSIS — C44319 Basal cell carcinoma of skin of other parts of face: Secondary | ICD-10-CM | POA: Diagnosis not present

## 2023-07-15 DIAGNOSIS — D1801 Hemangioma of skin and subcutaneous tissue: Secondary | ICD-10-CM | POA: Diagnosis not present

## 2023-07-15 DIAGNOSIS — D2272 Melanocytic nevi of left lower limb, including hip: Secondary | ICD-10-CM | POA: Diagnosis not present

## 2023-07-15 DIAGNOSIS — L570 Actinic keratosis: Secondary | ICD-10-CM | POA: Diagnosis not present

## 2023-07-15 DIAGNOSIS — L814 Other melanin hyperpigmentation: Secondary | ICD-10-CM | POA: Diagnosis not present

## 2023-07-15 DIAGNOSIS — D485 Neoplasm of uncertain behavior of skin: Secondary | ICD-10-CM | POA: Diagnosis not present

## 2023-07-15 DIAGNOSIS — L821 Other seborrheic keratosis: Secondary | ICD-10-CM | POA: Diagnosis not present

## 2023-07-15 DIAGNOSIS — L578 Other skin changes due to chronic exposure to nonionizing radiation: Secondary | ICD-10-CM | POA: Diagnosis not present

## 2023-07-28 ENCOUNTER — Encounter: Payer: Self-pay | Admitting: Cardiology

## 2023-07-28 ENCOUNTER — Ambulatory Visit: Attending: Cardiology | Admitting: Cardiology

## 2023-07-28 VITALS — BP 135/81 | HR 70 | Resp 16 | Ht 70.0 in | Wt 197.6 lb

## 2023-07-28 DIAGNOSIS — I4821 Permanent atrial fibrillation: Secondary | ICD-10-CM

## 2023-07-28 DIAGNOSIS — I10 Essential (primary) hypertension: Secondary | ICD-10-CM | POA: Diagnosis not present

## 2023-07-28 DIAGNOSIS — E7800 Pure hypercholesterolemia, unspecified: Secondary | ICD-10-CM

## 2023-07-28 NOTE — Progress Notes (Signed)
 Cardiology Office Note:  .   Date:  07/28/2023  ID:  Jose Higgins, DOB 07/23/1940, MRN [id number] PCP: Clarice Nottingham, MD  [facility] Providers Cardiologist:  Gordy Bergamo, MD   History of Present Illness: .   Jose Higgins is a 84 y.o. Caucasian male patient who is very active, asymptomatic and exercises regularly.  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    He has had a normal myocardial perfusion scan on 11/07/2022 with no ischemia and echocardiogram on 11/04/2022 revealing normal LVEF at 60 to 65% and moderate aortic valve stenosis with a mean gradient of 26 mmHg with valve area of 1.16 cm and mild to moderate aortic regurgitation.  He now presents for annual visit.  As he was completely asymptomatic, rate control strategy was felt to be appropriate.  Discussed the use of AI scribe software for clinical note transcription with the patient, who gave verbal consent to proceed.  History of Present Illness Jose Higgins is an 83 year old male with atrial fibrillation and type 2 diabetes who presents for cardiovascular follow-up. Atrial fibrillation was discovered incidentally during a routine check-up due to an irregular heart rhythm. He is asymptomatic with no fatigue, dyspnea, or chest pain. He is on Eliquis  for stroke risk reduction. A recent fall resulted in a significant lip bleed due to anticoagulation, but no other bleeding issues are reported.  Hypertension is managed with irbesartan 75 mg once daily and metoprolol . The irbesartan dose was reduced from 150 mg to 75 mg daily due to hypotension. He is on Lipitor 20 mg daily for hyperlipidemia, with an LDL of 62. Mild coronary  plaque buildup was noted on a coronary CT angiogram several years ago, leading to statin therapy initiation.  He experiences leg cramps in the mornings or when in bed, managed with magnesium  supplements and occasionally mustard.  Labs   External Labs:  Care everywhere labs 05/28/2023:  A1c 6.8%.  Labs 01/29/2023:  Total cholesterol 126, triglycerides 105, HDL 45, LDL 62.  Serum glucose 145 mg, BUN 15, creatinine 1.32, EGFR 54 mL, potassium 4.5, LFTs normal.  Hb 15.4/HCT 47.0, platelets 187, normal indicis.  ROS  Review of Systems  Cardiovascular:  Negative for chest pain, dyspnea on exertion and leg swelling.   Physical Exam:   VS:  BP 135/81 (BP Location: Left Arm, Patient Position: Sitting, Cuff Size: Normal)   Pulse 70   Resp 16   Ht 5' 10 (1.778 m)   Wt 197 lb 9.6 oz (89.6 kg)   SpO2 99%   BMI 28.35 kg/m    Wt Readings from Last 3 Encounters:  07/28/23 197 lb 9.6 oz (89.6 kg)  11/07/22 192 lb (87.1 kg)  09/10/22 192 lb (87.1 kg)    Physical Exam Neck:     Vascular: No carotid bruit or JVD.   Cardiovascular:     Rate and Rhythm: Normal rate. Rhythm irregular.     Pulses: Normal pulses and intact distal pulses.     Heart sounds: No murmur heard. Pulmonary:     Effort: Pulmonary effort is normal.     Breath sounds: Normal breath sounds.  Abdominal:     General: Bowel sounds are  normal.     Palpations: Abdomen is soft.   Musculoskeletal:     Right lower leg: No edema.     Left lower leg: No edema.   Skin:    Capillary Refill: Capillary refill takes less than 2 seconds.    Studies Reviewed: .    MYOCARDIAL PERFUSION IMAGING 11/07/2022   Narrative   The study is normal. The study is low risk.   No ST deviation was noted.   Left ventricular function is normal. End diastolic cavity size is normal.   Prior study not available for comparison.  ECHOCARDIOGRAM COMPLETE 11/04/2022  1. Left ventricular ejection fraction, by estimation, is 60 to 65%. The left  ventricle has normal function. The left ventricle has no regional wall motion abnormalities. There is mild concentric left ventricular hypertrophy. Left ventricular diastolic parameters are indeterminate. 2. Tricuspid valve regurgitation is mild to moderate.  RVSP 45 mmHg. 3. The left and non-coronary cusps of the aortic valve are fused. The aortic valve is tricuspid. There is moderate calcification of the aortic valve. Aortic valve regurgitation is mild to moderate. Moderate aortic valve stenosis. Aortic regurgitation PHT measures 898 msec. Aortic valve area, by VTI measures 1.69 cm. Aortic valve mean gradient measures 25.5 mmHg. Aortic valve Vmax measures 3.28 m/s.  EKG:    EKG Interpretation Date/Time:  Tuesday July 28 2023 15:04:58 EDT Ventricular Rate:  65 PR Interval:    QRS Duration:  80 QT Interval:  404 QTC Calculation: 420 R Axis:   47  Text Interpretation: EKG 07/28/2023: Atrial fibrillation with controlled ventricular response at rate of 65 bpm, normal axis.  Poor R progression, cannot exclude anteroseptal infarct old.  Nonspecific T abnormality, cannot exclude inferior ischemia.  PVCs (2).  Compared to 11/07/2022, nonspecific inferior T abnormality new. Confirmed by Randen Kauth, Jagadeesh ([id number]) on 07/28/2023 5:32:41 PM    Medications ordered    No orders of the defined types were placed in this encounter.    ASSESSMENT AND PLAN: .      ICD-10-CM   1. Permanent atrial fibrillation (HCC)  I48.21 EKG 12-Lead    2. Primary hypertension  I10     3. Pure hypercholesterolemia  E78.00      Assessment & Plan Permanent Atrial Fibrillation Atrial fibrillation was discovered incidentally during a routine check-up with no symptoms reported. Due to age and type 2 diabetes, there is a high risk of stroke, calculated at approximately 7% per year. Currently managed with Eliquis  to mitigate stroke risk. Discussed the importance of fall precautions due to the risk of bleeding associated with  anticoagulation therapy. - Continue Eliquis  for stroke prevention. - Advise on fall precautions to prevent bleeding complications.  Coronary calcification noted on CT scan and coronary calcium  score in the 69 percentile Mild coronary artery disease with a coronary calcium  score in the 69th percentile. Managed with statin therapy to prevent further cardiac events. - Continue current statin therapy.  Hypertension Hypertension is well-controlled with current medication regimen. Irbesartan dose was reduced to 75 mg due to low blood pressure readings. Metoprolol  is also part of the regimen. - Continue irbesartan 75 mg once daily. - Continue metoprolol  as prescribed.  Hyperlipidemia Hyperlipidemia is well-managed with atorvastatin . LDL is at 62 mg/dL, which is excellent. Previous coronary CT angiogram showed mild coronary plaque, managed with statin therapy. - Continue atorvastatin  20 mg once daily.  Type 2 Diabetes Mellitus Type 2 diabetes is well-controlled with a recent HbA1c of 6.8%. Overall stable from cardiac standpoint I will  see him back in a year or sooner if problems.  If he remains stable, I will see him back on a as needed basis.   Signed,  Gordy Bergamo, MD, [facility] 07/28/2023, 5:34 PM [facility] [address] [place], [place] [address] Phone: [phone]. Fax:  [phone]

## 2023-07-28 NOTE — Patient Instructions (Signed)
 Medication Instructions:  Your physician recommends that you continue on your current medications as directed. Please refer to the Current Medication list given to you today.  *If you need a refill on your cardiac medications before your next appointment, please call your pharmacy*  Lab Work: none If you have labs (blood work) drawn today and your tests are completely normal, you will receive your results only by: MyChart Message (if you have MyChart) OR A paper copy in the mail If you have any lab test that is abnormal or we need to change your treatment, we will call you to review the results.  Testing/Procedures: none  Follow-Up: At [facility], you and your health needs are our priority.  As part of our continuing mission to provide you with exceptional heart care, our providers are all part of one team.  This team includes your primary Cardiologist (physician) and Advanced Practice Providers or APPs (Physician Assistants and Nurse Practitioners) who all work together to provide you with the care you need, when you need it.  Your next appointment:   12 month(s)  Provider:   Knox Perl, MD    We recommend signing up for the patient portal called "MyChart".  Sign up information is provided on this After Visit Summary.  MyChart is used to connect with patients for Virtual Visits (Telemedicine).  Patients are able to view lab/test results, encounter notes, upcoming appointments, etc.  Non-urgent messages can be sent to your provider as well.   To learn more about what you can do with MyChart, go to [email or web address].   Other Instructions

## 2023-08-12 DIAGNOSIS — C44319 Basal cell carcinoma of skin of other parts of face: Secondary | ICD-10-CM | POA: Diagnosis not present

## 2023-08-25 DIAGNOSIS — I4891 Unspecified atrial fibrillation: Secondary | ICD-10-CM | POA: Diagnosis not present

## 2023-08-25 DIAGNOSIS — E1140 Type 2 diabetes mellitus with diabetic neuropathy, unspecified: Secondary | ICD-10-CM | POA: Diagnosis not present

## 2023-08-25 DIAGNOSIS — E7800 Pure hypercholesterolemia, unspecified: Secondary | ICD-10-CM | POA: Diagnosis not present

## 2023-08-25 DIAGNOSIS — I10 Essential (primary) hypertension: Secondary | ICD-10-CM | POA: Diagnosis not present

## 2023-09-01 DIAGNOSIS — I4891 Unspecified atrial fibrillation: Secondary | ICD-10-CM | POA: Diagnosis not present

## 2023-09-01 DIAGNOSIS — E7800 Pure hypercholesterolemia, unspecified: Secondary | ICD-10-CM | POA: Diagnosis not present

## 2023-09-01 DIAGNOSIS — Z7901 Long term (current) use of anticoagulants: Secondary | ICD-10-CM | POA: Diagnosis not present

## 2023-09-01 DIAGNOSIS — I10 Essential (primary) hypertension: Secondary | ICD-10-CM | POA: Diagnosis not present

## 2023-09-01 DIAGNOSIS — E1140 Type 2 diabetes mellitus with diabetic neuropathy, unspecified: Secondary | ICD-10-CM | POA: Diagnosis not present

## 2023-12-08 DIAGNOSIS — I2510 Atherosclerotic heart disease of native coronary artery without angina pectoris: Secondary | ICD-10-CM | POA: Diagnosis not present

## 2023-12-08 DIAGNOSIS — E785 Hyperlipidemia, unspecified: Secondary | ICD-10-CM | POA: Diagnosis not present

## 2023-12-08 DIAGNOSIS — I4891 Unspecified atrial fibrillation: Secondary | ICD-10-CM | POA: Diagnosis not present

## 2023-12-08 DIAGNOSIS — E1140 Type 2 diabetes mellitus with diabetic neuropathy, unspecified: Secondary | ICD-10-CM | POA: Diagnosis not present

## 2023-12-08 DIAGNOSIS — I10 Essential (primary) hypertension: Secondary | ICD-10-CM | POA: Diagnosis not present

## 2023-12-16 DIAGNOSIS — Z23 Encounter for immunization: Secondary | ICD-10-CM | POA: Diagnosis not present

## 2024-02-01 NOTE — ED Triage Notes (Signed)
 Pt arriving via [facility] from home [facility] independent living) with c/o AMS,vomiting, and nose bleed. Per EMS, wife reported that he went to sleep last night around 2300 at baseline. Wife reported this am she found pt in the bathroom at 0500 with a bloody nose and bruising to bilateral eyes. Pt had also vomited in the living room that did not appear to be red/coffee ground in color. Pt denies hitting head or any trauma. EMS reports that a engineer, materials assisted them with the pt and the  officer reported the pt hit a panic button at 0030 this am and the same officer assisted him at that time. Officer reported he had some bruising underneath bilateral eyes at that time but no other obvious injuries.  Pt does not remember the security officer coming to assist him this am. Per EMS, pt was unsteady of his feet but A&Ox4. On arrival, pt is A&O x4. Dried blood to bilateral nares

## 2024-02-01 NOTE — ED Provider Notes (Signed)
 ------------------------------------------------------------------------------- Attestation signed by [name], MD at 02/03/2024  8:49 PM ATTENDING SUPERVISORY NOTE  I have personally viewed the imaging studies performed. I have personally seen and examined the patient, and discussed the plan of care with the resident.  I have reviewed the documentation of the resident and agree.  -------------------------------------------------------------------------------  [facility] Emergency Department Physician Note   Medical Decision Making   HPI/ROS:  Jose Higgins is a 84 y.o. male with history of type 2 diabetes, hypertension, anticoagulant use who presents as a level 2 trauma transfer from [facility].  Per EMS he fell sometime last evening while in the bathroom.  He has had some viral symptoms where he is had vomiting and diarrhea and he went to the bathroom and episode of diarrhea.  He then had a fall where he had a blow to his head.  Family found him and he had complaints of a headache with some swelling around his bilateral eyes.  They brought him to [facility] where he is activated as a trauma.  I did a trauma assessment there and obtain CT imaging of the head, face, C-spine and performed angiography of his head and neck.  CT scan showing multicompartmental hemorrhage with left inferior frontal and left inferior temporal intraparenchymal contusions with localized mass effect and some moderate adjacent edema with a small volume intraventricular hemorrhage.  He also has an acute nondisplaced right mastoid temporal bone fracture along the occipital mastoid suture.  He was also found to have multiple bilateral orbital bone fractures with a small extraconal periosteal hematoma of the right orbital rim with mild associated mass effect.  He was given full dose of Kcentra at 4400 units.  History obtained from patient, chart review, EMS    Differential diagnosis included but not limited to: Intracranial hemorrhage, skull base fracture, facial fractures, fracture, intra-abdominal injury  Additional MDM:  Patient arrived as a level 2 trauma transfer from outside hospital with known multicompartmental intracranial hemorrhage with skull base fracture and facial fractures.  On arrival he had not intact airway and bilateral breath sounds.  He was alert and following commands at this time.  2 large-bore IVs access points were confirmed.  Patient placed on the monitor with no evidence of significant tachycardia.  Manual blood pressure obtained without evidence of hypotension.  Portable chest x-ray obtained without evidence of hemo or pneumothorax and the trachea was midline.  Portable pelvic x-ray with bilaterally intact pelvic rings and bilaterally located hips.  Secondary survey here concerning for bilateral periorbital ecchymosis and swelling with dried blood in the naris but no obvious nasal septal hematoma.  C-collar was in place and patient moving all extremities without any issues.  His chest was stable and nontender on palpation, his abdomen was soft nondistended without tenderness or obvious trauma.  His pelvis was stable and he had no traumatic injuries to the upper or lower extremities.  He had 5 out of 5 strength on physical examination and normal coordination and intact extraocular movements with pupils are equal round reactive to light.  There was evidence of a right subconjunctival hemorrhage on physical examination.  Patient moved to the CT scanner for remainder of his trauma imaging assessment.  Dose of Kcentra here determined to be adequate based on his weight further reversal not provided.  Remainder patient's trauma imaging here without traumatic injury.  Trauma laboratory assessment here without significant concern.  Trauma surgery team agreed to admit him  to the trauma ICU for further evaluation and management.   Due to the  patients current presenting symptoms, physical exam findings, and the workup stated above, it is thought that the etiology of the patients current presentation is:  1. Intraventricular hemorrhage    (CMD)   2. Closed fracture of base of skull, unspecified laterality, initial encounter (CMD)      Disposition:  ADMIT: Patient is felt to require admission and will be admitted to service below. Please see inpatient provider notes for additional treatment plan and course of care details.  The plan for this patient was discussed with Dr. Nicholaus, who voiced agreement and who oversaw evaluation and treatment of this patient.   Clinical Complexity A medically appropriate history, review of systems, and physical exam was performed.  Factors that affect the complexity of this encounter: computed tomography/CT imaging, lab work from prior visit, laboratory work from this visit, radiology plain film, and review of echocardiogram/EKG results  Patient's presentation is most consistent with acute presentation with potential threat to life or bodily function  HPI/ROS    See MDM   Physical Exam   Vitals:   02/01/24 1205 02/01/24 1207 02/01/24 1209 02/01/24 1257  BP:  145/75    BP Location:      Patient Position:      Pulse: 79  76 78  Resp: 20  17 (!) 21  Temp:      TempSrc:      SpO2: 92%  96% (!) 88%    Physical Exam Vitals and nursing note reviewed.  Constitutional:      General: He is not in acute distress.    Appearance: Normal appearance. He is not ill-appearing or toxic-appearing.  HENT:     Head:     Comments: No obvious trauma to the scalp, bilateral periorbital ecchymosis and swelling with midface tenderness palpation    Nose:     Comments: Dried blood in the naris bilaterally but no nasal septal hematomas    Mouth/Throat:     Mouth: Mucous membranes are moist.     Comments: Blood in the oropharynx is dry but no active evidence of bleeding Eyes:     Extraocular Movements:  Extraocular movements intact.     Conjunctiva/sclera: Conjunctivae normal.     Pupils: Pupils are equal, round, and reactive to light.     Comments: Bilateral periorbital ecchymosis with tenderness to palpation, right eye subconjunctival hemorrhages small, no evidence of hyphema, pupils equal round reactive to light, intact and nonpainful extraocular movements without evidence of entrapment  Neck:     Comments: C-collar in place, no C-spine tenderness to palpation Cardiovascular:     Rate and Rhythm: Normal rate and regular rhythm.     Pulses: Normal pulses.     Heart sounds: Normal heart sounds.     Comments: 2+ radial dorsalis pedis pulses bilaterally Pulmonary:     Effort: Pulmonary effort is normal. No respiratory distress.     Breath sounds: Normal breath sounds.  Abdominal:     General: Abdomen is flat.     Palpations: Abdomen is soft.     Tenderness: There is no abdominal tenderness.     Comments: No obvious trauma to the abdomen  Musculoskeletal:        General: No swelling, tenderness, deformity or signs of injury. Normal range of motion.     Comments: No tenderness to palpation of the T or L-spine's with no obvious step-offs or deformities  Skin:  General: Skin is warm.     Findings: No erythema or rash.  Neurological:     General: No focal deficit present.     Mental Status: He is alert and oriented to person, place, and time. Mental status is at baseline.     Cranial Nerves: No cranial nerve deficit.     Sensory: No sensory deficit.     Motor: No weakness.     Coordination: Coordination normal.     Comments: Moving all extremities, 5 out of 5 strength in the upper and lower extremities, no sensory deficits of the C5-7 distributions bilateral upper extremities, no saddle paresthesias, normal Babinski's bilaterally      Procedures   If procedures were preformed on this patient, they are listed below:  Procedures

## 2024-02-01 NOTE — ED Triage Notes (Signed)
 Level 2 trauma BIB [facility] from [place] with presenting problem of Fall; as per ems report patient fell last night at around 11pm reported t have diarrhea and vomiting; seen in [place] and find facial fracture skull fracture and ICH; patient received 4,448 units of Kcentra at 1012, 2 grams of ancef ; Airway intact with C- collar in placed, breathing spontaneously, saturating well on room air, both eye ecchymosis noted; GCS 15/15, no other deformity noted. Trauma team and ED team at bedside.

## 2024-02-01 NOTE — ED Provider Notes (Signed)
 Emergency Department Provider Note  This document was created using the aid of voice recognition Dragon dictation software.  History   Chief Complaint  Patient presents with   Altered Mental Status     HPI  Jose Higgins is a 84 y.o. male with a history of atrial fibrillation on Eliquis  who presents to the ED with complaints of altered mental status and bilateral eye bruising. Patient's wife states that her and her husband woke up this morning at 3 AM to use the bathroom, and she noticed that he was having a hard time ambulating by himself. He is usually able to ambulate at baseline. This is when she noticed that he has some bilateral eye bruising. She woke up again at 5 AM and saw vomit with blood on the living room floor. Patient does not recall any of these events. Patient's wife also states that the patient has had some congestion since Saturday with green mucus and that he was planning to go to the clinic this morning.   Physical Exam   Vitals:   02/01/24 1013 02/01/24 1017 02/01/24 1045 02/01/24 1054  BP: 137/69 149/71 150/78 150/78  BP Location: Right arm Right arm Right arm   Patient Position: Lying Lying Lying   Pulse: 72 65 68 68  Resp: (!) 21 (!) 22 (!) 21 (!) 21  Temp:   98.9 F (37.2 C) 98.9 F (37.2 C)  TempSrc:   Oral   SpO2: 94% 94% 93% 93%  Weight:        Physical Exam Vitals and nursing note reviewed.  Constitutional:      Appearance: He is well-developed. He is not toxic-appearing or diaphoretic.  HENT:     Head: Normocephalic.     Comments: Raccoon eyes, significant bruising and signs of trauma to the head  Epistaxis bilaterally    Nose: Nose normal.     Mouth/Throat:     Mouth: Mucous membranes are moist.  Eyes:     General: No scleral icterus. Pulmonary:     Effort: Pulmonary effort is normal. No respiratory distress.  Musculoskeletal:        General: Normal range of motion.     Cervical back: Normal range of motion and neck supple. No  rigidity.  Skin:    General: Skin is warm and dry.     Capillary Refill: Capillary refill takes less than 2 seconds.  Neurological:     Mental Status: He is confused.     Comments: Patient is awake and alert but very confused, having difficulty remembering what happened     Procedure Note  Critical Care  Performed by: Olita Duncan, MD Authorized by: Olita Duncan, MD   Critical care provider statement:    Critical care time (minutes):  37   Critical care time was exclusive of:  Separately billable procedures and treating other patients and teaching time   Critical care was necessary to treat or prevent imminent or life-threatening deterioration of the following conditions:  CNS failure or compromise and trauma   Critical care was time spent personally by me on the following activities:  Blood draw for specimens, development of treatment plan with patient or surrogate, discussions with consultants, evaluation of patient's response to treatment, examination of patient, obtaining history from patient or surrogate, review of old charts, re-evaluation of patient's condition, pulse oximetry, ordering and review of radiographic studies, ordering and review of laboratory studies and ordering and performing treatments and interventions   Care discussed with: accepting  provider at another facility     Medical Decision Making  Differentials considered: Trauma, infection, seizure, stroke or intracranial abnormality, acute psychosis, ingestion/overdose versus withdrawal, polypharmacy, electrolyte abnormality versus encephalopathy versus uremia versus acidosis  On my initial exam, the pt was calm, cooperative, conversant, follows commands appropriately, GCS 15, not tachycardic, not hypotensive, afebrile, no increased work of breathing or respiratory distress, no signs of impending respiratory failure.   Patient hooked up to cardiac telemetry for close monitoring, telemetry interpretation: Atrial  fibrillation, rate appropriate Plan: ED Course as of 02/01/24 1056  Mon Feb 01, 2024  1039 ER provider interpretation of EKG: Rhythm appears to be atrial fibrillation, QRS complexes narrow, intervals are grossly intact, nonspecific T wave changes are present  ER provider interpretation of labs: Hematologic panel with mild anemia, hemoglobin is 13.5, mild thrombocytopenia with a platelet count of 115, metabolic panel with no acute kidney injury, INR appropriate at 1.3  ER provider interpretation of imaging: Concern for intracranial hemorrhage on Noncon CT head  Discussed case with on-call attending radiology in real-time Radiology report: IMPRESSION: 1.  Acute multicompartmental hemorrhage as detailed above with moderately sized left inferior frontal and left inferior temporal lobe intraparenchymal contusions with local mass effect and moderate adjacent edema. Small volume intraventricular hemorrhage without overt hydrocephalus in the absence of prior imaging for comparison. No midline shift or herniation. 2.  Acute nondisplaced fracture coursing inferiorly from the right mastoid temporal bone along coursing superiorly along the occipitomastoid suture and into the right occipital and parietal bones.  3.  Attention to CT face for additional findings.   [DC]  1040 Given the patient is on a blood thinner with concerns of intracranial hemorrhage will reverse Eliquis  usage, Kcentra ordered, confirmed with pharmacy and with patient's wife about blood thinner compliance and dosage [DC]  1041 Also given concerns of skull fracture with potential open component given concerns of basilar skull fracture will administer prophylactic antibiotics [DC]  1041 Overall given patient's injuries will transfer patient to tertiary care facility for trauma management, patient accepted by trauma surgery at [facility], [facility], EMTALA documentation initiated  Updated patient's family and patient about  [DC]  1041  imaging results and clinical picture, in agreement with plan moving forward to transfer the patient [DC]    ED Course User Index [DC] Olita Duncan, MD   EMTALA documentation initiated  Patient transferred  ED Clinical Impression   1. Traumatic injury of head, initial encounter Active  2. Closed fracture of right side of base of skull, initial encounter (CMD) Active  3. Intracranial hemorrhage    (CMD) Active  4. Concussion with loss of consciousness of 30 minutes or less, initial encounter Active   ED Assessment/Plan  Clinical Complexity Number and complexity of problems addressed: Medical Decision Making Problems Addressed: Closed fracture of right side of base of skull, initial encounter (CMD): complicated acute illness or injury that poses a threat to life or bodily functions Concussion with loss of consciousness of 30 minutes or less, initial encounter: complicated acute illness or injury that poses a threat to life or bodily functions Intracranial hemorrhage    (CMD): complicated acute illness or injury that poses a threat to life or bodily functions Traumatic injury of head, initial encounter: complicated acute illness or injury that poses a threat to life or bodily functions  Amount and/or Complexity of Data Reviewed Independent Historian: EMS Labs: ordered. Radiology: ordered and independent interpretation performed. ECG/medicine tests: ordered and independent interpretation performed. Discussion of management  or test interpretation with external provider(s): On-call attending radiology, on-call trauma surgery  Risk Prescription drug management. Drug therapy requiring intensive monitoring for toxicity. Decision regarding hospitalization.   All radiology studies reviewed independently, additionally reading provided by radiologist available, unless otherwise noted.  Emergency Department Medication Summary: Medications  ceFAZolin  (ANCEF ) injection 2 g (has  no administration in time range)  iohexoL (OMNIPAQUE) 350 mg iodine/mL injection (MDV) 80 mL (80 mL intravenous Given 02/01/24 0943)  Kcentra (prothrombin complex concentrate) infusion in empty bag 4,448 Units (4,448 Units intravenous New Bag 02/01/24 1012)   ED Disposition: ED Disposition     ED Disposition  Transfer to Another Facility   Condition  Stable   Comment  --        This document serves as a record of services personally performed by Olita Duncan, MD. It was created on their behalf by Olita Duncan, MD, a trained medical scribe. The creation of this record is the providers dictation and/or activities during the visit.   Electronically signed by: Olita Duncan, MD 02/01/2024 10:56 AM

## 2024-02-05 NOTE — Discharge Summary (Signed)
 ------------------------------------------------------------------------------- Attestation signed by Ronal Percell Lent, MD at 02/24/2024  3:29 PM I have seen and examined the patient and agree with the resident's findings as documented.  Ronal Percell Lent, MD  -------------------------------------------------------------------------------  Acute Surgery Discharge Summary  Patient ID: Jose Higgins [id number] y.o. 1940-10-10  Admit date: 02/01/2024 Admitting Physician: Rosealee Sherrilyn Row, MD Admission Diagnoses:   GLF  Discharge date: 02/06/2024   Discharge Physician: Dr. Jayson Hamel  Discharge Diagnoses:  Principal Problem:   Trauma Resolved Problems:   * No resolved hospital problems. *    Indication for adm Home Medication Changes:      Hold Aspirin and Eliquis  until follow up with neurosurgery.  Hold home avapro until follow up with PCP due to mild hypotension.       Incidental Findings:  See below in patient instructions.     Discharge Exam: General:  NAD. Resting in bed. Family @ bedside. Asking appropriate questions regarding POC and dc. Neurological: GCS = 14/15. Waxes and weans.  HEENT: PERRL.  EOM's intact. Bilateral periorbital ecchymosis.  Cardiovascular: Regular rate. Well perfused. Peripheral pulses palpable x 4.  Respiratory: Respirations even and unlabored. No accessory muscle use noted. No resp distress or SOB noted. No supplemental O2 noted.  Abd. / GI: Abdomen soft, non-tender, non-distended, with + BS WNL GU: Voiding clear yellow urine without difficulty.  Extremities: MOE x 4 without difficulty and spontaneously.  Able to wiggle all  fingers / toes without difficulty, warm to touch with rapid cap refill.  Skin / Wound: Warm & dry.          ISV: 1500   Discharge disposition: SNF    DVT Prophylaxis: Hold Aspirin and Eliquis  until follow up.    Patient Instructions:  DISCHARGE INSTRUCTIONS  Please call the Trauma  Surgery Outpatient Clinic at [phone] for any questions or for any new symptoms, such as the following:  Repeated vomiting and nausea Unable to pee Headache that gets worse or does not go away  Sudden or worsening abdominal pain Unable to pass a stool in 3-4 days Signs of infection at your wound: redness, warm/hot to touch, foul smelling drainage, thick pus yellow/green drainage, or increased pain    Unable to drink fluids or tolerate food Fever of 101.5 or above    If unable to contact the Trauma Surgery Outpatient Clinic go to the hospital Emergency Department to be evaluated and treated.   Please call 911 or go to the nearest emergency department if any of the following occur: Dizziness, fainting, hearing loss, blurred vision, or changes in mental status Uncontrollable body jerking or movements; seizures Have one pupil (the black part in the middle of the eye) that is larger than the other Decreased sensation/feeling, tingling sensation/feeling, change in skin color (such as pale, blue, red), swelling, or unable to move any extremity  Getting more confused, restless, or agitated Slurred speech Weakness, numbness, or decreased coordination/balance Loss of consciousness (even a brief loss of consciousness should be taken seriously and the person should be carefully monitored)     If you develop chest pain or shortness of breath, please go to the nearest Emergency Department to be evaluated and treated immediately.   SURGERY CONTACT TELEPHONE NUMBERS  Please call with any questions to the following contact numbers:  Call [phone] for all assistance Call [phone] for urgent assistance after hours (5:00 PM to 8:00 AM) or on weekends  Trauma Nursing Triage/Clinic schedulers (Monday-Friday daytime working hours) [phone]  Discharge Hospital assistance 24 hour line  [phone]  [facility] Operator - 24 hours a day / 7 days a week  [phone]   Consulting services that may  have participated in your care:   Orthopedic( Trauma, Hand, Spine, Sports, etc)  [phone]  Neurosurgery [phone]  Plastic Surgery  [phone]  ENT [phone]  Dentistry [phone]  Ophthalmology  [phone]  Urology [phone]  Vascular Surgery [phone]     FOLLOW UP APPOINTMENTS:  TRAUMA:  Follow up at the Outpatient APP (Advanced Practice Provider - Nurse Practitioner or Physician's Assistant) Trauma Clinic is not necessary after discharge. If you feel you need to see this service for an issue related to this trauma, please call and schedule an appointment. [phone]).  OPHTHALMOLOGY: Follow up orbital fracture at the Outpatient Ophthalmology Clinic  after discharge. You will be called with an appointment date/time. Please call Ophthalmology if you do not receive and appointment date/time within a few days of discharge [phone])  PLASTIC SURGERY: Follow up is not required at Outpatient Plastic Surgery Clinic after discharge. If you continue to have issues/concerns regarding this injury, you may call to schedule and appointment with Plastic Surgery [phone]).  NEUROSURGERY:  Follow up traumatic brain injury at Outpatient Neurosurgery Clinic after discharge. You will be called with an appointment date/time. Please call Neurosurgery if you do not receive an appointment date/time within a few days of discharge [phone]).  PRIMARY CARE PROVIDER:  Please follow-up with your primary care provider within 1-2 weeks after discharge for current medical management as well as past and future medical conditions. If you do not have one, you are strongly encouraged to find a primary care provider to manage your health.   If you are unable to establish one, please call the [facility] at [phone]. Home medication changes to discuss:  Hold home Aspirin and Eliquis  until follow up with Neurosurgery.   Hold home Avapro until follow up with primary care  provider.   New Medications: - see after visit summary   INCIDENTAL FINDINGS:  None found with imaging at this facility.    Any incidental findings should be reviewed and followed by your PCP.  Patient / family verbalizes understanding and agreement with this plan. If you were transferred from outside facility, it will be your responsibility to request radiographic reports (CT's, xray's, etc)  and review with your PCP.    DIET:  Continue a regular diet as tolerated.    ACTIVITIES:  SPINE RESTRICTIONS:  No restrictions. Continue to use the breathing device (incentive spirometer and the Aerobika) provided to you in the hospital to expand your lungs to help prevent pneumonia.   Do  Avoid  Increase your activity gradually.    Lifting greater than 10 pounds for 8-12 weeks from date of injury.   Take short walks on a level surface unless you have been told not to do so.  No contact sports or activities such as football, hockey, wrestling, basketball, baseball, soccer, rock climbing, or running until instructed to do so. Your doctor will tell you when it is ok to start these activities.  Make frequent stops (about every hour) on lengthy care rides to stretch Over-exertion to the point of fatigue.  Expect some good days and some bad days, you may be sore following a day with increased activity      WEIGHT BEARING STATUS: Weight bearing as tolerated.    SWELLING:  Swelling and bruising in the limb below where your fracture/surgical site are expected.  Keep your limb elevated above your heart to decrease swelling, and you should continue to wear your compression stockings for six weeks if they were given to you in the hospital. Continue moving your toes and/or fingers several times per day to increase circulation and decrease stiffness.  Apply ice for 15 minutes at a time to help improve swelling     DRESSING / WOUND CARE / BATHING INSTRUCTIONS:  You may shower but do NOT immerse  wounds in water until all wounds are completely healed (no hot tubs, no tub baths, no swimming).  You may gently cleanse wounds with soap and water and then blot dry but DO NOT SCRUB! Inspect wounds daily for signs and symptoms of infection as discussed while in the hospital.   Shower Instructions: Gently wash with warm soapy water. Do not scrub. Allow water to gently run over incision to rinse. Pat dry. Apply clean new dry bandage.  Do not soak or submerge incision in hot tub, bath tub, pool, river, lake, ocean, or any other body of water.   Do not apply any creams, lotions, gels, ointments, or powders to incision until sutures/staples are removed at you follow up appointment    Wound Infection  WHAT YOU NEED TO KNOW:  A wound infection occurs when bacteria enters a break in the skin. The infection may involve just the skin, or affect deeper tissues or organs close to the wound.  DISCHARGE INSTRUCTIONS:  Return to the emergency department if:  You feel short of breath.  Your heart is beating faster than usual.  You feel confused.  Blood soaks through your bandages. Your wound comes apart or feels like it is ripping.  You have severe pain. You see red streaks coming from the infected area. Contact your healthcare provider if:  You have a fever or chills.  You have more pain, redness, or swelling near your wound. Your symptoms do not improve.  The skin around your wound feels numb. You have questions or concerns about your condition or care. Medicines:  You may need any of the following: NSAIDs , such as ibuprofen, help decrease swelling, pain, and fever. This medicine is available with or without a doctor's order. NSAIDs can cause stomach bleeding or kidney problems in certain people. If you take blood thinner medicine, always ask your healthcare provider if NSAIDs are safe for you. Always read the medicine label and follow directions. Antibiotics  help treat a bacterial infection.   Take your medicine as directed.  Contact your healthcare provider if you think your medicine is not helping or if you have side effects. Tell him or her if you are allergic to any medicine. Keep a list of the medicines, vitamins, and herbs you take. Include the amounts, and when and why you take them. Bring the list or the pill bottles to follow-up visits. Carry your medicine list with you in case of an emergency. Care for your wound as directed:  Keep your wound clean and dry. You may need to cover your wound when you bathe so it does not get wet. Clean your wound as directed with soap and water or wound cleaner. Put on new, clean bandages as directed. Change your bandages when they get wet or dirty. Help your wound heal:  Eat a variety of healthy foods.  Examples include fruits, vegetables, whole-grain breads, low-fat dairy products, beans, lean meats, and fish. Healthy foods may help you heal faster. You may also need to take vitamins and minerals.  Ask if you need to be on a special diet. Manage other health conditions.  Follow your healthcare provider's directions to manage health conditions that can cause slow wound healing. Examples include high blood pressure and diabetes. Do not smoke.  Nicotine and other chemicals in cigarettes and cigars can cause slow wound healing. Ask your healthcare provider for information if you currently smoke and need help to quit. E-cigarettes or smokeless tobacco still contain nicotine. Talk to your healthcare provider before you use these products. Follow up with your healthcare provider in 1 to 2 days:  Write down your questions so you remember to ask them during your visits.  Copyright [employer] 2019 Information is for [employer] use only and may not be sold, redistributed or otherwise used for commercial purposes. All illustrations and images included in CareNotes are the copyrighted property of A.D.A[employer]M., Inc. or [facility] The above information is an  educational aid only. It is not intended as medical advice for individual conditions or treatments. Talk to your doctor, nurse or pharmacist before following any medical regimen to see if it is safe and effective for you. Where can you learn more?  Log into [email or web address] at [email or web address] and enter the title of this document into the Search Medical Library box to learn more about your instructions.     BLOOD CLOT PREVENTION: Encourage movement (preferably walking and range of motion exercises) every 1-2 hours while awake to prevent blood clots.   No chemical (Aspirin, Lovenox  injetions, or Coumadin) prevention is necessary after discharge from hospital.  Hold Aspirin and Eliquis  until follow up with Neurosurgery.     PAIN:  Most patients find that one prescription is enough to control their pain and maintain their day to day function and mobility. For those with continued pain, we may prescribe additional medication if deemed appropriate after consultation.  The goal of pain medication is to ease the pain, to make the pain bearable or tolerable for you to proceed with your activities. The pain medication will not completely relieve the pain. Do not take more pain medicine than needed.   The Trauma Service is unable to provide a refill of opioid pain medications prior to 7 days after discharge. Please do NOT call or go to the trauma floor ([address]) for pain medication refills - they will NOT be able to help you. You may call for a refill once the course of pain medications is completed. Please Call Trauma Nursing triage for refills (Monday - Friday working hours [phone])   No refills are provided on weekends or holidays. Please plan accordingly to prevent running out of pain medications before you are due for a refill.    WE WILL NOT PRESCRIBE ADDITIONAL PAIN MEDICATION IF YOU TAKE ADDITIONAL DOSES OR ABUSE YOUR PRESCRIPTION.  We are unable to provide a refill for pain  medications if another provider is also writing opioid prescriptions.    Additionally, if given, refills will only be provided within the guidelines of the STOP ACT (STrengthen Opioid misuse Prevention is a law passed to reduce inappropriate and excessive opioid prescribing in [place]). The law limits the amount of opioids prescribed at discharge for the management of acute pain. A follow up consultation is required for any refills.    Weaning Plan: Percocet/Norco Weaning Plan: Week Three: 1 tab every 8 hours as needed for moderate to severe pain x 7 days  -- This is the prescription you were given at discharged  -- you will  be refilled with Norco  Week Four: 1 tab every 12 hours as needed for moderate to severe pain x 7   Patient verbalizes understanding and agreement with plan of care and discharge instructions.   Opioid Patient Handout  Opioid Pain Medication Information Sheet Opioid pain medications are often provided for the treatment of acute pain, with the goal (of any pain treatments) to reduce pain sufficiently to allow for increasing movement, activity and recovery. It is important for you and your family to know that opioids have some risks and side effects.  Risks of Taking Opioids Side Effects Associated with Opioids  Allergic Reaction Nausea and Vomiting  Addiction/Withdrawal Constipation  Respiratory depression (slow or stop breathing) Loss of Balance  Death Confusion   Physical Dependence   Our team of doctors, advanced practice providers, pharmacists, nurses, and counselors will identify your needs and prescribe therapies to treat your pain based on your individual requirements.  When you are discharged from the hospital you will be given specific instructions on how to wean off the prescribed pain medications to help minimize physical Addiction/Withdrawal dependence. You should follow the weaning regimen as written. If your pain is well controlled you may discontinue earlier  than planned. You may be denied further pain prescriptions if you take the medication other than how it was prescribed or seek prescriptions from a provider other than the trauma service at this facility.  If you do not take medication as prescribed, your provider may decide to switch or discontinue your pain medication entirely. You will not get early refills before your original prescription would have been completed. Substance abuse with street drugs (i.e. Heroin, Cocaine, Methamphetamine, etc.) prescription medications (i.e. Oxycodone, Xanax), and/or alcohol has become very prevalent, and can affect individuals of every race, religion, age and social class.  It is important that you secure your medications in a safe place such as a lock box. Be sure to keep out of reach of children and pets.  Do not share your prescription medications with others.  Patient verbalizes understanding that prescriptions for narcotic medicines will not and cannot be renewed if medication or prescription is lost or stolen. Recent government regulations have set limitations on the amount of opioid pain medications that is allowed to be prescribed but we will work to treat your pain by utilizing both opioid and non-opioid medications. Alternative treatments such as heat, ice, and music therapy provide relief and may aid with your recovery.   Please contact our team if you need help to wean off of the pain medications you have been prescribed.    Reminders: -  Please take prescribed pain medication as directed. -  The goal of pain medication is to have sufficient relief to allow for increasing movement, activity and recovery.  -  Do NOT drive while taking opioid pain medication.  -  Do NOT drink alcohol while taking prescription pain medication.  -  Please use over-the-counter medications for constipation, such as Miralax , Senakot, Colace, or Magnesium  Citrate, while you are taking opioid pain medication to avoid  constipation problems.  -  Pay attention to the side effects of any pain medication -  Do NOT exceed 3000 mg of Acetaminophen  (Tylenol ) TOTAL in a 24 hour period. (Please be aware that you may have acetaminophen  in other medications that you are taking.) The Percocet or Norco you are taking DOES contain 325mg  of Tylenol  in each tablet! Please talk to your orthopedic doctor before taking anti-inflammatory medications, such as Ibuprofen (  Advil, Motrin) or Naproxen (Aleve), as it may slow bone healing.   What should you do with left over pain medications? Left-over pain medications should be crushed and mixed with an undesirable substance such as kitty litter or coffee grounds then disposed of in the trash. [facility] also provides a scientist, research (medical) located in the outpatient pharmacy. You may also get in touch with your local pharmacy for possible pill take back dates. Be sure to remove your personal information from the label prior to throwing away the bottle.     For all forms that need to be completed by your physician, please contact: [facility] Department of Surgery [facility] [place], [place] [address] Phone: [phone] Fax: [phone] Email: [email or web address]  Please allow 7-10 business days for completion    DISCHARGE INSTRUCTIONS  Please call the Trauma Surgery Outpatient Clinic at [phone] for any questions or for any new symptoms, such as the following:  Repeated vomiting and nausea Unable to pee Headache that gets worse or does not go away  Sudden or worsening abdominal pain Unable to pass a stool in 3-4 days Signs of infection at your wound: redness, warm/hot to touch, foul smelling drainage, thick pus yellow/green drainage, or increased pain    Unable to drink fluids or tolerate food Fever of 101.5 or above    If unable to contact the Trauma Surgery Outpatient Clinic go to the hospital Emergency Department to be  evaluated and treated.   Please call 911 or go to the nearest emergency department if any of the following occur: Dizziness, fainting, hearing loss, blurred vision, or changes in mental status Uncontrollable body jerking or movements; seizures Have one pupil (the black part in the middle of the eye) that is larger than the other Decreased sensation/feeling, tingling sensation/feeling, change in skin color (such as pale, blue, red), swelling, or unable to move any extremity  Getting more confused, restless, or agitated Slurred speech Weakness, numbness, or decreased coordination/balance Loss of consciousness (even a brief loss of consciousness should be taken seriously and the person should be carefully monitored)     If you develop chest pain or shortness of breath, please go to the nearest Emergency Department to be evaluated and treated immediately.   SURGERY CONTACT TELEPHONE NUMBERS  Please call with any questions to the following contact numbers:  Call [phone] for all assistance Call [phone] for urgent assistance after hours (5:00 PM to 8:00 AM) or on weekends  Trauma Nursing Triage/Clinic schedulers (Monday-Friday daytime working hours) [phone]    Discharge Hospital assistance 24 hour line  [phone]  [facility] Operator - 24 hours a day / 7 days a week  [phone]   Consulting services that may have participated in your care:   Orthopedic( Trauma, Hand, Spine, Sports, etc)  [phone]  Neurosurgery [phone]  Plastic Surgery  [phone]  ENT [phone]  Dentistry [phone]  Ophthalmology  [phone]  Urology [phone]  Vascular Surgery [phone]     FOLLOW UP APPOINTMENTS:  TRAUMA:  Follow up at the Outpatient APP (Advanced Practice Provider - Nurse Practitioner or Physician's Assistant) Trauma Clinic is not necessary after discharge. If you feel you need to see this service for an issue related to this trauma, please call and schedule an  appointment. [phone]).  OPHTHALMOLOGY: Follow up orbital fracture at the Outpatient Ophthalmology Clinic  after discharge. You will be called with an appointment date/time. Please call Ophthalmology if you do not receive and appointment date/time within a few days  of discharge [phone])  PLASTIC SURGERY: Follow up is not required at Outpatient Plastic Surgery Clinic after discharge. If you continue to have issues/concerns regarding this injury, you may call to schedule and appointment with Plastic Surgery [phone]).  NEUROSURGERY:  Follow up traumatic brain injury at Outpatient Neurosurgery Clinic after discharge. You will be called with an appointment date/time. Please call Neurosurgery if you do not receive an appointment date/time within a few days of discharge [phone]).  PRIMARY CARE PROVIDER:  Please follow-up with your primary care provider within 1-2 weeks after discharge for current medical management as well as past and future medical conditions. If you do not have one, you are strongly encouraged to find a primary care provider to manage your health.   If you are unable to establish one, please call the [facility] at [phone]. Home medication changes to discuss:  Hold home Aspirin and Eliquis  until follow up with Neurosurgery.   Hold home Avapro until follow up with primary care provider.   New Medications: - see after visit summary   INCIDENTAL FINDINGS:  None found with imaging at this facility.    Any incidental findings should be reviewed and followed by your PCP.  Patient / family verbalizes understanding and agreement with this plan. If you were transferred from outside facility, it will be your responsibility to request radiographic reports (CT's, xray's, etc)  and review with your PCP.    DIET:  Continue a regular diet as tolerated.    ACTIVITIES:  SPINE RESTRICTIONS:  No restrictions. Continue to use the breathing device  (incentive spirometer and the Aerobika) provided to you in the hospital to expand your lungs to help prevent pneumonia.   Do  Avoid  Increase your activity gradually.    Lifting greater than 10 pounds for 8-12 weeks from date of injury.   Take short walks on a level surface unless you have been told not to do so.  No contact sports or activities such as football, hockey, wrestling, basketball, baseball, soccer, rock climbing, or running until instructed to do so. Your doctor will tell you when it is ok to start these activities.  Make frequent stops (about every hour) on lengthy care rides to stretch Over-exertion to the point of fatigue.  Expect some good days and some bad days, you may be sore following a day with increased activity      WEIGHT BEARING STATUS: Weight bearing as tolerated.    SWELLING:  Swelling and bruising in the limb below where your fracture/surgical site are expected.  Keep your limb elevated above your heart to decrease swelling, and you should continue to wear your compression stockings for six weeks if they were given to you in the hospital. Continue moving your toes and/or fingers several times per day to increase circulation and decrease stiffness.  Apply ice for 15 minutes at a time to help improve swelling     DRESSING / WOUND CARE / BATHING INSTRUCTIONS:  You may shower but do NOT immerse wounds in water until all wounds are completely healed (no hot tubs, no tub baths, no swimming).  You may gently cleanse wounds with soap and water and then blot dry but DO NOT SCRUB! Inspect wounds daily for signs and symptoms of infection as discussed while in the hospital.   Shower Instructions: Gently wash with warm soapy water. Do not scrub. Allow water to gently run over incision to rinse. Pat dry. Apply clean new dry bandage.  Do not soak  or submerge incision in hot tub, bath tub, pool, river, lake, ocean, or any other body of water.   Do not apply any creams,  lotions, gels, ointments, or powders to incision until sutures/staples are removed at you follow up appointment    Wound Infection  WHAT YOU NEED TO KNOW:  A wound infection occurs when bacteria enters a break in the skin. The infection may involve just the skin, or affect deeper tissues or organs close to the wound.  DISCHARGE INSTRUCTIONS:  Return to the emergency department if:  You feel short of breath.  Your heart is beating faster than usual.  You feel confused.  Blood soaks through your bandages. Your wound comes apart or feels like it is ripping.  You have severe pain. You see red streaks coming from the infected area. Contact your healthcare provider if:  You have a fever or chills.  You have more pain, redness, or swelling near your wound. Your symptoms do not improve.  The skin around your wound feels numb. You have questions or concerns about your condition or care. Medicines:  You may need any of the following: NSAIDs , such as ibuprofen, help decrease swelling, pain, and fever. This medicine is available with or without a doctor's order. NSAIDs can cause stomach bleeding or kidney problems in certain people. If you take blood thinner medicine, always ask your healthcare provider if NSAIDs are safe for you. Always read the medicine label and follow directions. Antibiotics  help treat a bacterial infection.  Take your medicine as directed.  Contact your healthcare provider if you think your medicine is not helping or if you have side effects. Tell him or her if you are allergic to any medicine. Keep a list of the medicines, vitamins, and herbs you take. Include the amounts, and when and why you take them. Bring the list or the pill bottles to follow-up visits. Carry your medicine list with you in case of an emergency. Care for your wound as directed:  Keep your wound clean and dry. You may need to cover your wound when you bathe so it does not get wet. Clean your wound as  directed with soap and water or wound cleaner. Put on new, clean bandages as directed. Change your bandages when they get wet or dirty. Help your wound heal:  Eat a variety of healthy foods.  Examples include fruits, vegetables, whole-grain breads, low-fat dairy products, beans, lean meats, and fish. Healthy foods may help you heal faster. You may also need to take vitamins and minerals. Ask if you need to be on a special diet. Manage other health conditions.  Follow your healthcare provider's directions to manage health conditions that can cause slow wound healing. Examples include high blood pressure and diabetes. Do not smoke.  Nicotine and other chemicals in cigarettes and cigars can cause slow wound healing. Ask your healthcare provider for information if you currently smoke and need help to quit. E-cigarettes or smokeless tobacco still contain nicotine. Talk to your healthcare provider before you use these products. Follow up with your healthcare provider in 1 to 2 days:  Write down your questions so you remember to ask them during your visits.  Copyright [employer] 2019 Information is for [employer] use only and may not be sold, redistributed or otherwise used for commercial purposes. All illustrations and images included in CareNotes are the copyrighted property of A.D.A.M., Inc. or [facility] The above information is an educational aid only. It  is not intended as medical advice for individual conditions or treatments. Talk to your doctor, nurse or pharmacist before following any medical regimen to see if it is safe and effective for you. Where can you learn more?  Log into [email or web address] at [email or web address] and enter the title of this document into the Search Medical Library box to learn more about your instructions.     BLOOD CLOT PREVENTION: Encourage movement (preferably walking and range of motion exercises) every 1-2 hours while awake to prevent blood clots.   No  chemical (Aspirin, Lovenox  injetions, or Coumadin) prevention is necessary after discharge from hospital.  Hold Aspirin and Eliquis  until follow up with Neurosurgery.     PAIN:  Most patients find that one prescription is enough to control their pain and maintain their day to day function and mobility. For those with continued pain, we may prescribe additional medication if deemed appropriate after consultation.  The goal of pain medication is to ease the pain, to make the pain bearable or tolerable for you to proceed with your activities. The pain medication will not completely relieve the pain. Do not take more pain medicine than needed.   The Trauma Service is unable to provide a refill of opioid pain medications prior to 7 days after discharge. Please do NOT call or go to the trauma floor ([address]) for pain medication refills - they will NOT be able to help you. You may call for a refill once the course of pain medications is completed. Please Call Trauma Nursing triage for refills (Monday - Friday working hours [phone])   No refills are provided on weekends or holidays. Please plan accordingly to prevent running out of pain medications before you are due for a refill.    WE WILL NOT PRESCRIBE ADDITIONAL PAIN MEDICATION IF YOU TAKE ADDITIONAL DOSES OR ABUSE YOUR PRESCRIPTION.  We are unable to provide a refill for pain medications if another provider is also writing opioid prescriptions.    Additionally, if given, refills will only be provided within the guidelines of the STOP ACT (STrengthen Opioid misuse Prevention is a law passed to reduce inappropriate and excessive opioid prescribing in [place]). The law limits the amount of opioids prescribed at discharge for the management of acute pain. A follow up consultation is required for any refills.    Weaning Plan: Percocet/Norco Weaning Plan: Week Three: 1 tab every 8 hours as needed for moderate to severe pain x 7 days  -- This is the  prescription you were given at discharged  -- you will be refilled with Norco  Week Four: 1 tab every 12 hours as needed for moderate to severe pain x 7   Patient verbalizes understanding and agreement with plan of care and discharge instructions.   Opioid Patient Handout  Opioid Pain Medication Information Sheet Opioid pain medications are often provided for the treatment of acute pain, with the goal (of any pain treatments) to reduce pain sufficiently to allow for increasing movement, activity and recovery. It is important for you and your family to know that opioids have some risks and side effects.  Risks of Taking Opioids Side Effects Associated with Opioids  Allergic Reaction Nausea and Vomiting  Addiction/Withdrawal Constipation  Respiratory depression (slow or stop breathing) Loss of Balance  Death Confusion   Physical Dependence   Our team of doctors, advanced practice providers, pharmacists, nurses, and counselors will identify your needs and prescribe therapies to treat your pain based on your individual requirements.  When  you are discharged from the hospital you will be given specific instructions on how to wean off the prescribed pain medications to help minimize physical Addiction/Withdrawal dependence. You should follow the weaning regimen as written. If your pain is well controlled you may discontinue earlier than planned. You may be denied further pain prescriptions if you take the medication other than how it was prescribed or seek prescriptions from a provider other than the trauma service at this facility.  If you do not take medication as prescribed, your provider may decide to switch or discontinue your pain medication entirely. You will not get early refills before your original prescription would have been completed. Substance abuse with street drugs (i.e. Heroin, Cocaine, Methamphetamine, etc.) prescription medications (i.e. Oxycodone, Xanax), and/or alcohol has become  very prevalent, and can affect individuals of every race, religion, age and social class.  It is important that you secure your medications in a safe place such as a lock box. Be sure to keep out of reach of children and pets.  Do not share your prescription medications with others.  Patient verbalizes understanding that prescriptions for narcotic medicines will not and cannot be renewed if medication or prescription is lost or stolen. Recent government regulations have set limitations on the amount of opioid pain medications that is allowed to be prescribed but we will work to treat your pain by utilizing both opioid and non-opioid medications. Alternative treatments such as heat, ice, and music therapy provide relief and may aid with your recovery.   Please contact our team if you need help to wean off of the pain medications you have been prescribed.    Reminders: -  Please take prescribed pain medication as directed. -  The goal of pain medication is to have sufficient relief to allow for increasing movement, activity and recovery.  -  Do NOT drive while taking opioid pain medication.  -  Do NOT drink alcohol while taking prescription pain medication.  -  Please use over-the-counter medications for constipation, such as Miralax , Senakot, Colace, or Magnesium  Citrate, while you are taking opioid pain medication to avoid constipation problems.  -  Pay attention to the side effects of any pain medication -  Do NOT exceed 3000 mg of Acetaminophen  (Tylenol ) TOTAL in a 24 hour period. (Please be aware that you may have acetaminophen  in other medications that you are taking.) The Percocet or Norco you are taking DOES contain 325mg  of Tylenol  in each tablet! Please talk to your orthopedic doctor before taking anti-inflammatory medications, such as Ibuprofen (Advil, Motrin) or Naproxen (Aleve), as it may slow bone healing.   What should you do with left over pain medications? Left-over pain  medications should be crushed and mixed with an undesirable substance such as kitty litter or coffee grounds then disposed of in the trash. [facility] also provides a scientist, research (medical) located in the outpatient pharmacy. You may also get in touch with your local pharmacy for possible pill take back dates. Be sure to remove your personal information from the label prior to throwing away the bottle.     For all forms that need to be completed by your physician, please contact: [facility] Department of Surgery [facility] [place], [place] [address] Phone: [phone] Fax: [phone] Email: [email or web address]  Please allow 7-10 business days for completion   Discharge Medications:   Medication List     PAUSE taking these medications    aspirin 81 mg EC tablet Wait  to take this until your doctor or other care provider tells you to start again. Hold until follow up with neurosurgery.  Take 81 mg by mouth Once Daily.   Eliquis  5 mg Tab Wait to take this until your doctor or other care provider tells you to start again. Hold until follow up with Neurosurgery  Generic drug: apixaban  Take 5 mg by mouth 2 (two) times a day.   irbesartan 150 mg tablet Wait to take this until your doctor or other care provider tells you to start again. Commonly known as: AVAPRO Take 150 mg by mouth Once Daily.       START taking these medications    levETIRAcetam 500 mg tablet Commonly known as: KEPPRA Take 1 tablet (500 mg total) by mouth 2 (two) times a day. For 3 days.   melatonin 3 mg tablet Take 3 tablets (9 mg total) by mouth at bedtime.   oxyCODONE-acetaminophen  5-325 mg per tablet Commonly known as: PERCOCET Take 1 tablet by mouth every 8 (eight) hours as needed for moderate pain (4-6) or severe pain (7-10).   polyethylene glycol 17 gram packet Commonly known as: GLYCOLAX  Take 17 g by mouth daily as needed for constipation.   *  QUEtiapine 25 mg tablet Commonly known as: SEROquel Take 2 tablets (50 mg total) by mouth at bedtime.   * QUEtiapine 25 mg tablet Commonly known as: SEROquel Take 0.5 tablets (12.5 mg total) by mouth daily.      * * This list has 2 medication(s) that are the same as other medications prescribed for you. Read the directions carefully, and ask your doctor or other care provider to review them with you.          CHANGE how you take these medications    acetaminophen  325 mg tablet Commonly known as: TYLENOL  Take 2 tablets (650 mg total) by mouth every 6 (six) hours for 10 days. What changed:  medication strength how much to take       CONTINUE taking these medications    Accu-Chek Aviva Plus test strp test strip Generic drug: glucose blood CHECK BLOOD SUGAR ONCE DAILY   alfuzosin  10 mg Tb24 24 hour tablet Commonly known as: UROXATRAL  Take 10 mg by mouth daily with dinner.   amoxicillin 500 mg capsule Commonly known as: AMOXIL Take 4 capsules (2000 mg total) by mouth one hour prior to dental appointment   atorvastatin  20 mg tablet Commonly known as: LIPITOR Take 20 mg by mouth nightly.   b complex vitamins Tab tablet Take 1 tablet by mouth 2 (two) times a day.   calcium  carbonate 1500 mg (600 mg calcium ) tablet Commonly known as: OS-CAL Take 1,500 mg by mouth daily. with a meal   cetirizine  10 mg tablet Commonly known as: ZyrTEC  Take 10 mg by mouth nightly.   coenzyme Q-10 100 mg capsule Take 100 mg by mouth 2 (two) times a day.   cromolyn 5.2 mg/spray (4 %) Spry nasal spray Commonly known as: NASALCROM Administer 2 sprays into affected nostril(s) nightly.   Lancets Misc USE AS DIRECTED TO CHECK BLOOD SUGAR ONCE DAILY   magnesium  200 mg Tab Take  by mouth.   metoprolol  succinate 50 mg 24 hr tablet Commonly known as: TOPROL  XL Take 50 mg by mouth Once Daily.   omega 3-dha-epa-fish oil 1,000 mg capsule Commonly known as: OMEGA 3 Take 1 g by mouth  Once Daily.   sennosides-docusate sodium  8.6-50 mg per tablet Commonly known as: PERICOLACE  Take 2 tablets by mouth 2 (two) times a day.   Synjardy XR 12.5-1,000 mg Tbph Generic drug: empagliflozin-metFORMIN   triamcinolone acetonide 0.1 % cream Commonly known as: KENALOG Apply 1 Application topically 2 (two) times a day as needed.       ASK your doctor about these medications    chlorproMAZINE  50 mg tablet Commonly known as: THORAZINE  Take 25 mg by mouth 3 (three) times a day as needed. Indications: hiccups that are hard to cure         Where to Get Your Medications     You can get these medications from any pharmacy   Bring a paper prescription for each of these medications oxyCODONE-acetaminophen  5-325 mg per tablet    Information about where to get these medications is not yet available   Ask your nurse or doctor about these medications acetaminophen  325 mg tablet levETIRAcetam 500 mg tablet melatonin 3 mg tablet polyethylene glycol 17 gram packet QUEtiapine 25 mg tablet QUEtiapine 25 mg tablet         Follow-up Appointments @PATFUTUREAPPT @ Appointments which have been scheduled for you    Mar 04, 2024 10:00 AM Hospital Follow Up Established with [place] Mallie Milroy, PA-C [facility] [place] [facility] [place] [address] [phone]  Please arrive 15 minutes prior to your scheduled appointment.           Discharge Medications: Please see patient's section of discharge home meds.    The patient has meet all goals necessary for discharge from a medical, surgical and therapy standpoint. Thus, the patient is stable and suitable for discharge. New discharge medications and home care were discussed in detail and the patient stated understanding of use and administration. The patient verbalized understanding of all discharge instructions and therefore was  released.   Follow-up:  Primary care physician in one week for medical management and continuation of previous home medications. If patient does not have primary care physician, patient was instructed to follow up at [facility]. Contact phone number was given to patient prior to discharge. See AVS section of follow up appointments.   Time spent on discharge greater than 35 minutes  Electronically signed by: Debby Raford Rower, MD 02/06/2024 11:06 AM   *Some images could not be shown.

## 2024-02-05 NOTE — Unmapped External Note (Signed)
" °   ° ° °  [place] Medicaid Long Term Care FL2 Form       IDENTIFICATION  Patient Name: Jose Higgins Birthdate: [birth year]  SSN:  [id number]  Sex: male Admission Date (Current Location): 02/01/2024  [facility] and [place] Number: [place] and Address:  [facility]  [address] [place] [address]  Provider Number: [id number]  Attending Physician Name:   Rosealee Sherrilyn Row, MD Address:  [facility] [place] [address] Relative Name and Address:  Extended Emergency Contact Information Primary Emergency Contact: Alexander,Jennifer Mobile Phone: [phone] Relation: Daughter Secondary Emergency Contact: Morgan,Delynn Mobile Phone: [phone] Relation: Daughter  Current Level of Care: Hospital Recommended Level of Care: Skilled Nursing Facility Prior Approval Number: [id number]  Date Approved/Denied: 02/24/19 PASRR Number: [id number]  Discharge Plan: Skilled Nursing Facility    Current Diagnoses: Principal Problem:   Trauma Resolved Problems:   * No resolved hospital problems. *    DISORIENTED AMBULATORY STATUS BLADDER BOWEL  Intermittently Ambulatory Continent Continent  INAPPROPRIATE BEHAVIOR FUNCTIONAL LIMITATIONS COMMUNICATION OF NEEDS RESPIRATION        O2 (As needed)  PERSONAL CARE ASSISTANCE ACTIVITIES/SOCIAL SKIN NUTRITION STATUS  Feeding, Total Care Active, Family Supportive Other (comment) Diet Height: 1.778 m (5' 10) (02/01/2024  4:45 PM) Weight: 87.3 kg (192 lb 7.4 oz) (02/02/2024  5:00 AM)  PHYSICIAN VISITS NEUROLOGICAL    30 days          SPECIAL CARE FACTORS FREQUENCY  Speech Therapy     PT Frequency: per facility routine         Speech Therapy Frequency: per facility routine OT Frequency: per facility routine    MEDICATIONS: Scheduled Meds:acetaminophen , 650 mg, oral, Q6H SCH atorvastatin , 20 mg, oral, Nightly enoxaparin , 30 mg, subcutaneous, Q12H  SCH insulin  aspart (NovoLOG )/insulin  lispro (HumaLOG) injection (WF), 0-12 Units, subcutaneous, TID PC levETIRAcetam, 500 mg, oral, BID melatonin oral (liquid/tablet), 9 mg, oral, At Bedtime metoprolol  succinate, 50 mg, oral, Daily polyethylene glycol, 17 g, oral, Daily QUEtiapine, 12.5 mg, oral, Daily QUEtiapine, 50 mg, oral, At Bedtime sennosides-docusate sodium , 1 tablet, oral, BID tamsulosin, 0.4 mg, oral, Daily   Continuous Infusions:  PRN Meds:.  albuterol    benzocaine-menthol   dextrose    dextrose    haloperidol lactate   oxyCODONE-acetaminophen  Do not use this list as official medication orders. Please verify with discharge summary.  X-ray and Laboratory Findings/Date: See Clinical Information for details     ADDITIONAL INFORMATION: Therapy Precautions: Fall risk Bracing: Spine precautions - No restrictions     Emmie VEAR Chancy, RN  "

## 2024-02-06 NOTE — Progress Notes (Signed)
 Case Management Update  Date: 02/06/2024   Time: 1:40 PM   Patient Type: Inpatient SW did receive message from nursing to call [facility] at [phone]. SW did place call and was advised they just needed to confirm plan of the time the pt was being picked up.  SW did advise approx 200pm.  Melissa had no additional questions at this time         Anticipated Discharge Location: Skilled Nursing Facility - short-term rehab  If Plan A discharging location is not feasible: Potential Plan B: Skilled Nursing Facility - short-term rehab         Silvano KATHEE Gosling, MSW

## 2024-02-06 NOTE — Nursing Note (Signed)
 Report given to Melissa, CHARITY FUNDRAISER at [facility]. No issues at time of discharge. All belongings taken with the pt or his daughter. Pt currently awaiting EMS transport.

## 2024-02-08 NOTE — Progress Notes (Signed)
 Case Management Discharge Note        CSN: [id number] DOB: 1940-08-07 Service: Trauma Location: R903/A  Patient Class: Inpatient  DC Disposition: : Skilled Nursing Facility  Discharge DC Disposition: : Skilled Nursing Facility Placement Facility Type: Skilled Nursing Facility Skilled Nursing - Assisted Living Facility: Other out of county Discharge Transport: Building Services Engineer [facility]: [facility] Research Scientist (physical Sciences)  Discharge Referrals Patient Preference: [facility] surveyor, quantity area/county shared with patient/family: Return/previous involvement Patient Preference for Post-Acute Provider Form completed: Return/Previous Involvement Case closed, patient/family agree with disposition plan: Yes    Patient discharged to [employer] at [place].       Emmie VEAR Chancy, RN

## 2024-02-09 NOTE — H&P (Signed)
 ------------------------------------------------------------------------------- Attestation signed by Prentice Lamar Fiedler, MD at 02/10/2024 12:15 AM Critical Care Attending Attestation: The patient was seen and examined.  Recent labs, vitals, radiology imaging, and medications reviewed.  Plan of care discussed and developed contemporaneously on multi-disciplinary rounds.  CPT time per Jose Mclean, NP.  84 year old male with history of hypertension, T2DM, atrial fibrillation previously on Eliquis , recent influenza infection and  ground-level fall on 1/5 with basilar skull fracture, temporal bone fracture, orbital fractures, and left IPH requiring Kcentra and transfer to the [facility].  He was treated with Keppra for 7 days.  He was managed nonoperatively and discharged to rehab on 1/8.  In the interim, he has had very poor p.o. intake and worsening encephalopathy per family.  Tonight, he presented with headache, confusion, and worsening hyponatremia to 128.  He has not been on aspirin or Eliquis  since his index fall on 1/5.  CT head demonstrated slight increase in size of left frontal lobe hematoma with worsening vasogenic edema and mass effect with 5 mm midline shift.  On exam, he is awake, alert and oriented to person and year only.  He is somewhat confused and occasionally confabulates.  He has some dysmetria with finger-to-nose testing.  His cranial nerves appear intact.  He is antigravity with all 4 extremities with no drift.   Admit to ICU for every hour neurochecks, correction of hyponatremia.  Current sodium 128, goal sodium 136 by midday tomorrow.  Start 3% saline at a rate of 25 cc/hour, check BMP every 4 hours.  Will work up hyponatremia with serum osmolality, urine osmolality, urine sodium, TSH, cortisol.  Suspect etiology of hyponatremia is hypovolemic in the setting of GI volume loss with a couple of days of diarrhea and poor p.o. intake.  Received 1 L normal saline  in the ED.  Of note, he was influenza positive on 1/3, continues to have productive cough.  CXR without focal infiltrate to suspect superimposed bacterial pneumonia.  Currently on room air.  Continue to hold systemic anticoagulation as well as aspirin.  SCDs for DVT prophylaxis.  Case was discussed with neurosurgery, Dr. Scharlene.  Appreciate assistance.   Prentice SAUNDERS. Fiedler, M.D., MA. Ed. Assistant Professor Department of Anesthesiology, Critical Care Medicine Section [facility]   -------------------------------------------------------------------------------  [facility] Critical Care History & Physical    Reason for ICU admission:  Altered Mental Status   HPI:  Jose Higgins is a 84 y.o. male admitted 02/09/2024 with altered mental status.  84 year old male with history of type 2 diabetes, hypertension, oral anticoagulant related to atrial fibs.  Presented to emergency department on January 5 after suffering a fall.  Patient was noted to have a intraventricular hemorrhage with mass effect and acute nondisplaced right mastoid temporal bone fracture, multiple bilateral orbital bone fractures with a small hematoma of the right orbital rim.  Patient was treated with Keppra for which he has completed.  Today patient presented to the emergency department from [facility]. Patient's family noticed he has become more lethargic weak and altered since Sunday with complain of headache. Initially headache was worse just with coughing, but now is fairly constant. He has  Upon his presentation to the emergency department patient underwent repeat head CT which was concerning for previously noted left frontal lobe IPH with slight increase in size with worsening vasogenic edema. Serum sodium 128 (previously 134).  Patient is being admitted to ICU for close neurological monitoring and correction of electrolyte imbalance.  [facility] has been consulted and appreciate recommendations.      Patient was seen and evaluated by plastic and reconstructive surgery for maxillary sinus fractures recommendation of conservative management with routine outpatient follow-up.  Patient was seen and evaluated by ophthalmology for acute nondisplaced orbital fracture: no interventions: follow up as outpatient     Medical History[1] Prior to Admission medications  Medication Sig Start Date End Date Taking? Authorizing Provider  acetaminophen  (TYLENOL ) 325 mg tablet Take 2 tablets (650 mg total) by mouth every 6 (six) hours for 10 days. 02/05/24 02/15/24  Olam Roetta Cassis, AGNP  alfuzosin  (UROXATRAL ) 10 mg Tb24 24 hour tablet Take 10 mg by mouth daily with dinner. 12/01/19   HISTORICAL PROVIDER, CONVERSION  amoxicillin (AMOXIL) 500 mg capsule Take 4 capsules (2000 mg total) by mouth one hour prior to dental appointment 04/23/20   Norleen Dyana Hoar, MD  [Paused] aspirin 81 mg EC tablet Take 81 mg by mouth Once Daily. Wait to take this until your doctor or other care provider tells you to start again. 01/17/20   Laymon Orlando Lack, FNP  atorvastatin  (LIPITOR) 20 mg tablet Take 20 mg by mouth nightly. 02/25/19   HISTORICAL PROVIDER, CONVERSION  b complex vitamins tab tablet Take 1 tablet by mouth 2 (two) times a day. 12/30/19   HISTORICAL PROVIDER, CONVERSION  calcium  carbonate (OS-CAL) 1500 mg (600 mg calcium ) tablet Take 1,500 mg by mouth daily. with a meal    HISTORICAL PROVIDER, CONVERSION  cetirizine  (ZyrTEC ) 10 mg tablet Take 10 mg by mouth nightly. 12/01/19   HISTORICAL PROVIDER, CONVERSION  chlorproMAZINE  (THORAZINE ) 50 mg tablet Take 25 mg by mouth 3 (three) times a day as needed. Indications: hiccups that are hard to cure Patient not taking: Indications: hiccups that are hard to cure. Reported on 02/01/2024 01/17/20   Laymon Orlando Suth, FNP  coenzyme Q-10 100 mg capsule Take 100 mg by mouth 2 (two) times a day. 12/30/19   HISTORICAL PROVIDER, CONVERSION  cromolyn (NASALCROM) 5.2  mg/spray (4 %) spry nasal spray Administer 2 sprays into affected nostril(s) nightly. 12/30/19   HISTORICAL PROVIDER, CONVERSION  [Paused] Eliquis  5 mg tab Take 5 mg by mouth 2 (two) times a day. Wait to take this until your doctor or other care provider tells you to start again.    HISTORICAL PROVIDER, CONVERSION  glucose blood (Accu-Chek Aviva Plus test strp) test strip CHECK BLOOD SUGAR ONCE DAILY 03/15/20   HISTORICAL PROVIDER, CONVERSION  [Paused] irbesartan (AVAPRO) 150 mg tablet Take 150 mg by mouth Once Daily. Wait to take this until your doctor or other care provider tells you to start again. 12/30/19   HISTORICAL PROVIDER, CONVERSION  Lancets misc USE AS DIRECTED TO CHECK BLOOD SUGAR ONCE DAILY 03/15/20   HISTORICAL PROVIDER, CONVERSION  levETIRAcetam (KEPPRA) 500 mg tablet Take 1 tablet (500 mg total) by mouth 2 (two) times a day. For 3 days. 02/05/24 02/08/24  Olam Roetta Cassis, AGNP  magnesium  200 mg tab Take  by mouth. 01/09/22   HISTORICAL PROVIDER, CONVERSION  melatonin 3 mg tablet Take 3 tablets (9 mg total) by mouth at bedtime. 02/05/24 05/05/24  Olam Roetta Cassis, AGNP  metoprolol  succinate (TOPROL  XL) 50 mg 24 hr tablet Take 50 mg by mouth Once Daily. 10/23/19   HISTORICAL PROVIDER, CONVERSION  omega 3-dha-epa-fish oil (OMEGA 3) 1,000 mg capsule Take 1 g by mouth Once Daily. 01/09/22   HISTORICAL PROVIDER, CONVERSION  oxyCODONE-acetaminophen  (PERCOCET) 5-325 mg per tablet Take 1 tablet by mouth every 8 (  eight) hours as needed for moderate pain (4-6) or severe pain (7-10). 02/06/24 02/11/24  Debby Raford Rower, MD  polyethylene glycol (GLYCOLAX ) 17 gram packet Take 17 g by mouth daily as needed for constipation. 02/05/24 02/19/24  Olam Roetta Cassis, AGNP  QUEtiapine (SEROquel) 25 mg tablet Take 0.5 tablets (12.5 mg total) by mouth daily. 02/06/24 03/07/24  Olam Roetta Cassis, AGNP  QUEtiapine (SEROquel) 25 mg tablet Take 2 tablets (50 mg total) by mouth at bedtime. 02/05/24 03/06/24  Olam Roetta Cassis, AGNP   sennosides-docusate sodium  (PERICOLACE) 8.6-50 mg per tablet Take 2 tablets by mouth 2 (two) times a day. 01/17/20   Laymon Ellen Suth, FNP  Synjardy XR 12.5-1,000 mg TBph  01/10/21   HISTORICAL PROVIDER, CONVERSION  triamcinolone (KENALOG) 0.1 % cream Apply 1 Application topically 2 (two) times a day as needed. 01/06/20   HISTORICAL PROVIDER, CONVERSION   Allergies[2]  Review of Systems Review of Systems - History obtained from spouse and chart review  Objective:   Current vital signs Blood pressure 117/73, pulse 71, resp. rate (!) 22, weight 81.4 kg (179 lb 6.4 oz), SpO2 95%. Recent Patient Data       02/09/2024 1953         BP: 117/73   Pulse: 71   Resp: 22*   SpO2: 95 %      Body mass index is 25.74 kg/m.   Input/Output I/O this shift: In: 1000.4 [IV Piggyback:1000.4] Out: -     Physical Exam Gen: Elderly non-toxic male Neuro: Alert, confused  4 - Opens eyes on own  6 - Follows simple motor commands  4 - Seems confused, disoriented  HEENT: bilateral orbital ecchymosis   Cardiac: Atrial fib: rate controlled: 68  Pulm: equal bilateral rise an fall of chest wall , respirations even clear to auscultation  Abdomen: soft, non-tender, non-distended  Skin:warm, dry, an intact  Extremities: MOE x 4   Data Review:   Radiology:  Radiology Results (last 72 hours)     Procedure Component Value Units Date/Time   CT Head WO Contrast W Quant CT Tiss Character When Performed [[id number]] Collected: 02/09/24 2124   Order Status: Completed Updated: 02/09/24 2130   Addenda:       Date of Service: 2024-02-09 20:24:00  -----ADDENDUM-----:  Receipt of this report by the clinical staff was confirmed with Leita Phenix, MD by Nathanael Beckwith on Feb 09, 2024 21:29:00 EST.  Electronically signed by: Jantovsky, Madalyne on 02/09/2024 09:29:27 PM [facility]  -----ORIGINAL REPORT-----:  EXAM: CT Head Without Intravenous Contrast. CLINICAL HISTORY: Recent ICH,  AMS.  TECHNIQUE: Axial computed tomography images of the head/brain without intravenous contrast.   COMPARISON: 02/02/24.  FINDINGS:  BRAIN: Previously noted left frontal lobe intraparenchymal hematomas have slightly increased in size currently measuring up to 2.4 x 2.3 cm with worsening vasogenic edema noted.  Mild increased mass-effect seen in the left lateral ventricle with approximately 0.5 cm left to right midline shift along the anterior left frontal lobe.  Close continued follow-up recommended.  Stable subdural hemorrhage is seen along the bilateral frontal lobes and along the bilateral parietal convexities.  Stable scattered subarachnoid hemorrhage noted.  Stable intraventricular hemorrhage. SOFT TISSUES: No significant facial or scalp soft tissue swelling evident. No radiopaque foreign body is seen.   BONES: No acute skull fracture. IMPRESSION:  Previously noted left frontal lobe intraparenchymal hematomas have slightly increased in size currently measuring up to 2.4 x 2.3 cm with worsening vasogenic edema noted.  Mild increased mass-effect seen in  the left lateral ventricle with approximately 0.5 cm left to right midline shift along the anterior left frontal lobe.  Close continued follow-up recommended.  Remaining intraparenchymal hemorrhages are grossly stable.  Electronically signed by: Christin Curb, MD on 02/09/2024 09:24:58 PM [facility] Per PQRS, all CT exams are performed using one or more of the following dose reduction techniques: automated exposure control, adjustment of the mA and/or kV according to patient size, or use of iterative reconstruction technique.  Signed: 02/09/24 2129 by Christin Deliliah Curb, MD    Assessment and Plan:   Neuro/Psych:  Altered Mental status r/t Intraparenchymal hemorrhage  CT image: left frontal lobe intraparenchymal hematoma 2.4 x 2.3 cm w/vasogenic edema  Neurologically intact, GCS 14, protecting airway Q1 hour neuro checks NSU consult:  recommendations appreciated Pt completed AED's : 1/12 Repeat CTH for any acute neurological change  Sedation/Analgesia:  none  Cardiovascular/Fluids: Atrial Fib Hypertension rate controlled, not on anticoagulated  Last Echo reveals:  LVEF: 60-65% Will hold home anti-HTN (irbesartan)  SBP<160, MAP >^% Resume metoprolol  : 25 mg q 12 Monitor EKG Replete electrolytes: K>4, MG>2 Continue to hold Eliquis  (pt to follow up with NSU. Prior to resuming)  Pulmonary:   #Flu + per patient   -continue supportive care, tylenol  for fever, dextromethorphan -guai for cough suppresion  -encourage hydration, saline spray  Room Air Monitor: sPo2 goal>92% Encourage IS   GI/NUT:  Advance diet as tolerated  Diet:  Clear liquid diet, will advance as tolerated - Zofran  for nausea  -bowel regime  Renal/LYTES/Acid-Base: Hyponatremia : Na: 128 r/t GI volume loss also w/ poor oral intake  3% Na infusion: 25 cc/hr: for goal correction: 135-145  -serum osmolality:  -Urine osmolality/Urine Na: 24 TSH: 0.97 Cortisol :  Hold any Thiazides Baseline Cr 0.82, today's Cr 0.87   Monitor BM: q 4 hours, UOP, avoid nephrotoxic agents.  CCM electrolyte protocol  Infectious Disease:  Afebrile WBC 7.62 Monitor Hem/Onc/Coag:  Monitor   Hgb 13.8, transfuse for hgb < 7 INR: 1.3 DVT:  SCD's Endocrine:  Diabetes Mellitus type II  SSI for glucose goal 140 - 180 Hgb A1C: 6.9 Hold oral medications Musculoskeletal/Skin: Warm and dry Encourage frequent turning, and pressure offloading PT/OT consult      Daily ICU Care Checklist:  Stress Ulcer Prophylaxis: Famotidine  DVT Prophylaxis:  Sequential Compression Devices  HOB > 30 degrees:  Yes Spontaneous Breathing Trial:  N/A Daily Awakening:  N/A Continued need for central/PICC line:  N/A Continue urinary catheter for:  is not present.  Activity:  PT and OT Advanced Care Planning: Full Code Disposition: Keep in ICU.   The patient is critically ill with  severe metabolic derangement and requires high complexity decision making for assessment and support including neurologic monitoring and treatment and assessment and treatment of severe metabolic derangement(s).  Critical care time devoted to patient care services described in this note was 45 minutes total evaluation time.  Care during the described time interval was provided by me. I have reviewed this patient's available data, including medical history, events of note, physical examination and test results.   Electronically signed:   Junella Claudene Mclean, NP 02/09/2024        [1] Past Medical History: Diagnosis Date   Cancer    (CMD)    skin cancer   Diabetes mellitus type II, controlled (CMD)    Hyperlipidemia    Hypertension   [2] Allergies Allergen Reactions   Adhesive Rash    Bandaids, coban is preferred.   *  Some images could not be shown.

## 2024-02-09 NOTE — ED Provider Notes (Signed)
 Emergency Department Provider Note   Provider at bedside: 02/09/2024 8:46 PM  History obtained from the: relatives at bedside  History   Chief Complaint  Patient presents with   Altered Mental Status     History provided by:  Relative History limited by:  Mental status change Language interpreter used: No     Pt is a 84 y.o. male who presents with altered mental status, onset a few days ago. Relatives at bedside report that since Sunday the patient has complained of constant head pain, and since then has had no PO intake. Since 4 PM today the patient has been slurring his words, speaking gibberish, and was unable to recognize his wife and daughter. He then fell asleep and was very difficult to arouse awake again. They note that the patient fell and had a head bleed on 1/4, and was discharged on Saturday to rehab. At discharge he had head pain when he would cough, and would become altered at night. They report that the patient has not ate over the past three days, and that he is only drinking water when he takes his medications. They deny any other symptoms in the patient at this time.   No LMP for male patient.  Past Medical History Medical History[1]  Past Surgical History Surgical History[2]  Medications These were reviewed. See nursing note for details.  Allergies Adhesive  Family History Family History[3]  Social History Social History[4]  Review of Systems  Review of Systems  Physical Exam  I have reviewed the following vital signs: BP 117/73 (BP Location: Right arm, Patient Position: Lying)   Pulse 71   Resp (!) 22   Wt 81.4 kg (179 lb 6.4 oz)   SpO2 95%   BMI 25.74 kg/m   Due to the patient's condition, bedside cardiac monitoring was ordered. I reviewed the patient's telemetry/bedside monitoring. It demonstrates A-Fib in the 60s.  Physical Exam Vitals and nursing note reviewed.  Constitutional:      General: He is not in acute distress.     Appearance: Normal appearance. He is normal weight. He is not ill-appearing.  HENT:     Head: Normocephalic. Raccoon eyes (biltaerally) present.     Right Ear: External ear normal.     Left Ear: External ear normal.     Nose: Nose normal.     Mouth/Throat:     Mouth: Mucous membranes are moist.     Pharynx: Oropharynx is clear.  Eyes:     Pupils: Pupils are equal, round, and reactive to light.  Cardiovascular:     Rate and Rhythm: Normal rate. Rhythm irregularly irregular.     Heart sounds: Normal heart sounds.  Pulmonary:     Effort: Pulmonary effort is normal.     Breath sounds: Normal breath sounds.  Abdominal:     Palpations: Abdomen is soft.     Tenderness: There is no abdominal tenderness.  Musculoskeletal:        General: Normal range of motion.     Cervical back: Normal range of motion and neck supple.  Skin:    General: Skin is warm.  Neurological:     General: No focal deficit present.     Sensory: Sensation is intact.     Motor: Motor function is intact.     Comments: Arouses to touch and does follows commands.  Psychiatric:        Mood and Affect: Mood normal.        Behavior: Behavior normal.  Results  LABS Lab Results (last 24 hours)     Procedure Component Value Ref Range Date/Time   Comprehensive Metabolic Panel [[id number]]  (Abnormal) Collected: 02/09/24 1957   Lab Status: Final result Specimen: Blood from Venous Updated: 02/09/24 2029    Sodium 128* 136 - 145 mmol/L     Potassium 3.6 3.4 - 4.5 mmol/L     Chloride 96* 98 - 107 mmol/L     CO2 22 21 - 31 mmol/L     Comment: High lactate dehydrogenase (LDH) concentrations in patient samples may cause falsely increased bicarbonate results. If markedly elevated LDH levels are suspected, please assess results in conjunction with patient's LDH values.      Anion Gap 10 6 - 14 mmol/L     Glucose, Random 118* 70 - 99 mg/dL     Blood Urea Nitrogen (BUN) 14 7 - 25 mg/dL     Creatinine 9.12 9.29 - 1.30  mg/dL     eGFR 85 >40 fO/fpw/8.26f7     Comment: GFR estimated by CKD-EPI equations(NKF 2021).   Recommend confirmation of Cr-based eGFR by using Cys-based eGFR and other filtration markers (if applicable) in complex cases and clinical decision-making, as needed.      Albumin 3.4* 3.5 - 5.7 g/dL     Total Protein 6.7 6.4 - 8.9 g/dL     Bilirubin, Total 1.8* 0.3 - 1.0 mg/dL     Alkaline Phosphatase (ALP) 53 34 - 104 U/L     Aspartate Aminotransferase (AST) 15 13 - 39 U/L     Alanine Aminotransferase (ALT) 17 7 - 52 U/L     Calcium  9.1 8.6 - 10.3 mg/dL     BUN/Creatinine Ratio --    Comment: Creatinine is normal, ratio is not clinically indicated.       Corrected Calcium  9.6 mg/dL     Comment: Reference Ranges Not Established. Recommend testing using ionized calcium .     CBC with Differential [[id number]]  (Abnormal) Collected: 02/09/24 1957   Lab Status: Final result Specimen: Blood from Venous Updated: 02/09/24 2016   Narrative:     The following orders were created for panel order CBC with Differential. Procedure                               Abnormality         Status                    ---------                               -----------         ------                    CBC with [id number]       Abnormal            Final result               Please view results for these tests on the individual orders.   CBC with Differential [[id number]]  (Abnormal) Collected: 02/09/24 1957   Lab Status: Final result Specimen: Blood from Venous Updated: 02/09/24 2016    WBC 7.62 4.40 - 11.00 10*3/uL     RBC 4.38* 4.50 - 5.90 10*6/uL     Hemoglobin 13.8* 14.0 - 17.5 g/dL     Hematocrit 61.6* 58.4 -  50.4 %     Mean Corpuscular Volume (MCV) 87.4 80.0 - 96.0 fL     Mean Corpuscular Hemoglobin (MCH) 31.6 27.5 - 33.2 pg     Mean Corpuscular Hemoglobin Conc (MCHC) 36.1 33.0 - 37.0 g/dL     Red Cell Distribution Width (RDW) 12.8 12.3 - 17.0 %     Platelet Count (PLT) 256 150 - 450  10*3/uL     Comment: Result Repeated       Mean Platelet Volume (MPV) 7.8 6.8 - 10.2 fL     Neutrophils % 68 %     Lymphocytes % 21 %     Monocytes % 10 %     Eosinophils % 1 %     Basophils % 0 %     Neutrophils Absolute 5.20 1.80 - 7.80 10*3/uL     Lymphocytes # 1.60 1.00 - 4.80 10*3/uL     Monocytes # 0.80 0.00 - 0.80 10*3/uL     Eosinophils # 0.00 0.00 - 0.50 10*3/uL     Basophils # 0.00 0.00 - 0.20 10*3/uL        Labs reviewed by myself and considered in medical decision making. Please see below for more detail.  RADIOLOGY Radiology Results (last 72 hours)     Procedure Component Value Units Date/Time   CT Head WO Contrast W Quant CT Tiss Character When Performed - In process [[id number]] Resulted: 02/09/24 2031   Order Status: Sent Updated: 02/09/24 2031   This result has not been signed. Information might be incomplete.     XR Chest 1 View - In process [[id number]] Resulted: 02/09/24 2010   Order Status: No result Updated: 02/09/24 2010   This result has not been signed. Information might be incomplete.         Imaging reviewed by myself and considered in medical decision making. Imaging final read interpreted by radiology. Please see below for more detail.  EKG My interpretation of the EKG shows  rate 70, atrial fibrillation, nonspecific T wave changes, and prolonged QT interval    Procedure Note  Critical Care  Performed by: Leita Phenix, MD Authorized by: Leita Phenix, MD   Critical care provider statement:      Critical care time (minutes):  36     Critical care time was exclusive of:  Separately billable procedures and treating other patients     Critical care was necessary to treat or prevent imminent or life-threatening deterioration of the following conditions:  CNS failure or compromise and dehydration     Critical care was time spent personally by me on the following activities:  Development of treatment plan with patient or surrogate, discussions  with consultants, examination of patient, obtaining history from patient or surrogate, ordering and performing treatments and interventions, ordering and review of laboratory studies, ordering and review of radiographic studies, pulse oximetry, re-evaluation of patient's condition and review of old charts     Care discussed with:  admitting provider     Medical Decision Making  ED Course    Initial Differential Diagnoses: I have considered the following in determining workup and treatments: DDx: intoxication, infection, metabolic derangement, stroke, ICH, hypoxia, hypercapnia, elevated ammonia, overdose, brain mass, seizure, renal failure, sepsis, shock, thyroid storm, hemorrhage, psychiatric illness    Labs/Imaging/medications/other testing ordered due to concerns discussed in history of and physical exam include: cbc, cmp, ua mic, EKG, CXR, and CT head WO contrast    Therapies: These medications and interventions were provided for the  patient while in the ED. Medications  sodium chloride  (bolus) 0.9 % bolus 1,000 mL (has no administration in time range)    Parenteral narcotics ordered: No  Drug Therapy requiring intensive monitoring ordered: No  Testing Results: Lab significant for hyponatremia for which pt will be given IV fluids  Imaging Results: My interpretation of CT scan shows slight midline shift. Final result pending radiology read.  I dicussed imaging finding with provider: Yes, Dr. Scharlene with [facility] who states hyponatremia is likely worsening findings and this needs to be corrected.   I reviewed medical record from Discharge Summary on 02/06/2024 which showed intraventricular hemorrhage.   The following consultations were required: Case discussed with Dr. Pascual with Critical Care who will come see patient for admission.  Case discussed with Dr. Scharlene with Neurosurgery who recommends admitting the patient to ICU for treatment of his hyponatremia.     Clinical Complexity  Patient's presentation is most consistent with acute presentation with potential threat to life or bodily function.  Decision to admit or increased level of care requiring transfer: Yes: Pt admitted due to findings on exam/workup   Discussed options for workup with patient including risks and benefits via shared decision making and decided to proceed with workup.  Evidence based calculators if applicable:     ED Assessment/Plan and Clinical Impression  Pt is a 84 y.o. male who presented due to worsening headache and now AMS after recent traumatic ICH. Pt appears to have worsening ICH now with slight midline shift and hyponatremia. Plan is ICU admission for IV hydration and close monitoring.   Clinical Impression 1. Hyponatremia   2. Trauma      _____________________________ Scribe's Attestation: Faustino Doffing, MD obtained and performed the history, physical exam and medical decision making elements that were entered into the chart. Documentation assistance was provided by me personally, a scribe. Signed by Karena LOISE Hurst, Scribe on 02/09/2024 8:46 PM    Documentation assistance provided by the scribe. I was present during the time the encounter was recorded. The information recorded by the scribe was done at my direction and has been reviewed and validated by me. Doffing Faustino, MD 02/09/2024 8:46 PM       [1] Past Medical History: Diagnosis Date   Cancer    (CMD)    skin cancer   Diabetes mellitus type II, controlled (CMD)    Hyperlipidemia    Hypertension   [2] Past Surgical History: Procedure Laterality Date   OTHER SURGICAL HISTORY     Procedure: OTHER SURGICAL HISTORY (Dental Implants); x4   SKIN BIOPSY     Procedure: SKIN BIOPSY   SOFT TISSUE CYST EXCISION Left    Procedure: SOFT TISSUE CYST EXCISION; Shoulder- fatty tissue   TONSILLECTOMY     Procedure: TONSILLECTOMY   TOTAL HIP ARTHROPLASTY Right 01/16/2020   Procedure: TOTAL  HIP REPLACEMENT - POSTERIOR APPROACH;  Surgeon: Norleen Dyana Hoar, MD;  Location: [facility] MAIN OR;  Service: Orthopedics;  Laterality: Right;  [3] Family History Problem Relation Name Age of Onset   Heart attack Father    [4] Social History Tobacco Use   Smoking status: Never   Smokeless tobacco: Never  Substance Use Topics   Alcohol use: Not Currently   Drug use: Never  *Some images could not be shown.

## 2024-02-09 NOTE — ED Triage Notes (Signed)
 Pt. Arrives via [facility] from [facility] with chief complaint of decreased PO intake and lethargy x 3 days worsening around 1600.  EMS reports pt. Is (+) flu   Pt. Transferred to [facility] from this facility on 1/4 due to closed fracture of right side base of skull and intraventricular hemorrhage.

## 2024-02-10 NOTE — Care Plan (Signed)
" °  Problem: SAFETY - MEDICAL RESTRAINT Goal: Remains free of injury from restraints (Restraint for Interference with Medical Device) Description: INTERVENTIONS: 1. Determine that other, less restrictive measures have been tried or would not be effective before applying the restraint 2. Evaluate the patient's condition at the time of restraint application 3. Inform patient/family regarding the reason for restraint 4. Q2H: Monitor safety, psychosocial status, comfort, nutrition and hydration Outcome: Progressing Goal: Free from restraint(s) (Restraint for Interference with Medical Device) Description: INTERVENTIONS: 1. ONCE/SHIFT or MINIMUM Q12H: Assess and document the continuing need for restraints 2. Q24H: Continued use of restraint requires LIP to perform face to face examination and written order 3. Identify and implement measures to help patient regain control Outcome: Progressing   "

## 2024-02-10 NOTE — Progress Notes (Signed)
 "   Division of Pharmacy Services  Medication History Completion Note  Name/DOB/Age of Patient: Jose Higgins / [birth year] / 84 y.o.  Location: [facility] 4S ICU  Type: Hospital admission & Modality: Facility MAR  Confirmed two patient identifiers: Yes  Confirmed patient is alert & oriented: Yes  Medication History Source (Med History Informants):  Facility [facility] Name/Phone Number: [place]    Asked about any missing medications (such as pumps, injectable meds, TPN, OTC, etc): Yes  PTA Med List:  Prior to Admission Medications     Reviewed by Alan Nat Clay, CPhT on 02/10/24 at [phone]    Medication Sig Last Dose Informant Taking? Status  acetaminophen  (TYLENOL ) 325 mg tablet Take 2 tablets (650 mg total) by mouth every 6 (six) hours for 10 days. 02/09/2024  5:43 PM Other Yes Active  alfuzosin  (UROXATRAL ) 10 mg Tb24 24 hour tablet Take 10 mg by mouth daily with dinner. 02/08/2024  5:09 PM Other Yes Active  amoxicillin (AMOXIL) 500 mg capsule Take 4 capsules (2000 mg total) by mouth one hour prior to dental appointment Unknown Other No Active  aspirin 81 mg EC tablet Take 81 mg by mouth Once Daily. Unknown Other No Active  atorvastatin  (LIPITOR) 20 mg tablet Take 20 mg by mouth nightly. 02/08/2024  9:55 PM Other Yes Active  b complex vitamins tab tablet Take 1 tablet by mouth 2 (two) times a day. 02/09/2024 10:12 AM Other Yes Active  benzonatate (TESSALON) 100 mg capsule Take 100 mg by mouth 3 (three) times a day as needed for cough. 02/09/2024 10:15 AM Other Yes Active  calcium  carbonate (OS-CAL) 1500 mg (600 mg calcium ) tablet Take 1,500 mg by mouth daily. with a meal 02/09/2024 10:12 AM Other Yes Active  cetirizine  (ZyrTEC ) 10 mg tablet Take 10 mg by mouth nightly. 02/08/2024  5:10 PM Other Yes Active  chlorproMAZINE  (THORAZINE ) 50 mg tablet Take 25 mg by mouth 3 (three) times a day as needed. Indications: hiccups that are hard to cure 02/09/2024  3:19 PM Other Yes Active  coenzyme  Q-10 100 mg capsule Take 100 mg by mouth 2 (two) times a day. 02/09/2024 Other Yes Active  cromolyn (NASALCROM) 5.2 mg/spray (4 %) spry nasal spray Administer 2 sprays into affected nostril(s) nightly. Unknown Other No Active  dextromethorphan -guaifenesin  (ROBITUSSIN-DM) 10-100 mg/5 mL liquid Take 10 mL by mouth 4 (four) times a day as needed. 02/08/2024 10:46 AM Other Yes Active  Eliquis  5 mg tab Take 5 mg by mouth 2 (two) times a day. Unknown Other No Active  glucose blood (Accu-Chek Aviva Plus test strp) test strip CHECK BLOOD SUGAR ONCE DAILY Unknown Other No Active  insulin  lispro (HumaLOG) 100 unit/mL injection Inject 2-14 Units under the skin 4 (four) times a day before meals and nightly. CBG: 201-250= 2 units, 251-300= 4 units, 301-350= 6 units, 351-400= 8 units, 401-450= 10 units, 451-500= 12 units, if greater than 500 give 14 units abd call NP/MD Unknown Other No Active  irbesartan (AVAPRO) 150 mg tablet Take 150 mg by mouth Once Daily. Unknown Other No Active  Lancets misc USE AS DIRECTED TO CHECK BLOOD SUGAR ONCE DAILY Unknown Other No Active  levETIRAcetam (KEPPRA) 500 mg tablet Take 1 tablet (500 mg total) by mouth 2 (two) times a day. For 3 days. 02/09/2024 10:12 AM Other Yes Active  magnesium  200 mg tab Take  by mouth. 02/09/2024 10:11 AM Other Yes Active  melatonin 3 mg tablet Take 3 tablets (9 mg total) by mouth at  bedtime. 02/08/2024  9:55 PM Other Yes Active  metoprolol  succinate (TOPROL  XL) 50 mg 24 hr tablet Take 50 mg by mouth Once Daily. 02/09/2024 10:12 AM Other Yes Active  mirtazapine (REMERON) 15 mg tablet Take 15 mg by mouth every evening. 02/08/2024  5:16 PM Other Yes Active  polyethylene glycol (GLYCOLAX ) 17 gram packet Take 17 g by mouth daily as needed for constipation. Unknown Other No Active  QUEtiapine (SEROquel) 25 mg tablet Take 2 tablets (50 mg total) by mouth at bedtime. 02/08/2024 10:03 PM Other Yes Active  sennosides-docusate sodium  (PERICOLACE) 8.6-50 mg per tablet  Take 2 tablets by mouth 2 (two) times a day. 02/09/2024 10:11 AM Other Yes Active         Med Note HONOR, AMANDA   Wed Feb 10, 2024  8:51 AM)    Synjardy XR 12.5-1,000 mg TBph Take 1 tablet by mouth daily. 02/09/2024 10:11 AM Other Yes Active  traMADoL (ULTRAM) 50 mg tablet Take 25 mg by mouth every 6 (six) hours as needed for moderate pain (4-6). 02/09/2024 11:45 AM Other Yes Active  triamcinolone (KENALOG) 0.1 % cream Apply 1 Application topically 2 (two) times a day as needed. Unknown Other No Active           Audit from Redirected Encounters   **Prior to Admission medications have not yet been reviewed for this encounter**     Selected Pharmacy:  [id number] - [place], [place] - [address] AT [facility] [address] - PHONE: [phone] - FAX: [phone]  Comments: All medications and allergies verified via MAR from [employer].  Removed Quetiapine 25mg  (duplicate order), Percocet 5-325mg  (prefers Tramadol), and Fish Oil patient is not taking at facility.  Added Tramadol 50mg , Benzonatate 100mg , Mirtazapine 15mg , Geri-Tussin DM 10-100mg /33ml, and Humalog as reflected on facility MAR.  Patient is continuing to hold the Aspirin 81mg , Eliquis  5mg , and Irbesartan 150mg  as instructed by provider.  Messaged Katelynne Simpson, [place] regarding medication list.  Electronically signed by: Alan Nat Clay, CPhT 02/10/2024 8:52 AM  Medications reconciled by provider: No   Signature/Co-signature, if required: Alan Nat Clay, CPhT   Date/Time: 02/10/2024 8:52 AM  "

## 2024-02-10 NOTE — Care Plan (Signed)
  Problem: Health Behavior: Goal: MCB Ability to state ways to decrease the risk of falls will be met by discharge Description: Ability to state ways to decrease the risk of falls will improve by discharge Outcome: Progressing   Problem: Safety: Goal: Will remain free from falls by discharge Description: Will remain free from falls by discharge Outcome: Progressing   Problem: Knowledge Deficit Goal: Patient/family/caregiver demonstrates understanding of disease process, treatment plan, medications, and discharge instructions Description: Complete learning assessment and assess knowledge base. Outcome: Progressing   Problem: Compromised Skin Integrity Goal: Skin integrity is maintained or improved Description: Assess and monitor skin integrity. Identify patients at risk for skin breakdown on admission and per policy. Collaborate with interdisciplinary team and initiate plans and interventions as needed.    Outcome: Progressing Goal: Fluid and electrolyte balance are achieved/maintained Description: Assess and monitor vital signs (orthostatic vitals if applicable), fluid intake and output, urine color, labs, skin turgor, mucous membranes, jugular venous distention, edema, circumference of edematous extremities and abdominal girth, respiratory status, and mental status.  Monitor for signs and symptoms of hypovolemia (tachycardia, rapid breathing, decreased urine output, postural hypotension, confusion, syncope).  Monitor for signs and symptoms of hypervolemia (strong rapid pulse, shortness of breath, difficulty breathing lying down, crackles heard in lung fields, edema). Collaborate with interdisciplinary team and initiate plan and interventions as ordered. Outcome: Progressing Goal: Nutritional status is improving Description: Monitor and assess patient for malnutrition (ex- brittle hair, bruises, dry skin, pale skin and conjunctiva, muscle wasting, smooth red tongue, and disorientation).  Collaborate with interdisciplinary team and initiate plan and interventions as ordered.  Monitor patient's weight and dietary intake as ordered or per policy. Utilize nutrition screening tool and intervene per policy. Determine patient's food preferences and provide high-protein, high-caloric foods as appropriate.  Outcome: Progressing   Problem: Urinary Incontinence Goal: Perineal skin integrity is maintained or improved Description: Assess genitourinary system, perineal skin, labs (urinalysis), and history of incontinence to include past management, aggravating, and alleviating factors.  Collaborate with interdisciplinary team and initiate plans and interventions as needed. Outcome: Progressing

## 2024-02-18 NOTE — Discharge Summary (Signed)
 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 Metoprolol  switched from home metoprolol  succinate 50 mg daily to metoprolol  tartrate 25 mg BID. Amlodipine 5 mg daily started for blood pressure control. Jardiance-metformin held due to acute illness and inconsistent PO intake. Seroquel used for agitation; may continue depending on SNF protocol. Haldol/Valium PRN for severe agitation (use sparingly). Flomax continued for urinary retention. Pending labs to follow up on: Monitor BMP  Incidental findings requiring follow-up: Dysphagia with aspiration risk requires ongoing SLP evaluation at SNF; consider alternative nutrition depending on intake. Persistent cerebral edema on CT despite stable hemorrhage needs repeat CT head and neurosurgery follow-up in 2 to 3 weeks (Dr. Scharlene).       Hospital Course:   For full  details, please see H&P, progress notes, consult notes and ancillary notes. Briefly, Jose Higgins is a 84 y.o. year old male with a  PMH of Traumatic intracranial hemorrhages: bilateral frontal contusions, left temporal contusion, bilateral SDH, IVH, nondisplaced right  mastoid temporal bone fracture (from 02/01/24 fall) presented to ER for acute altered mental status due to TBI + delirium, worsened by  Hyponatremia. Being managed for following   1.Traumatic intracranial hemorrhages with recurrent edema The patient initially suffered a traumatic brain injury on 02/01/24 after a fall secondary to dehydration from diarrhea/vomiting. Initial CT showed: Left frontal & temporal contusions Right frontal contusion Bilateral subdural hematomas (6 mm R, 4 mm L) Small intraventricular hemorrhage Nondisplaced right mastoid temporal bone fracture He received Kcentra for reversal of Eliquis  and completed 1 week of Keppra. Serial CTs were stable and he was discharged to SNF on 02/06/24. He returned on 02/09/24 with worsening confusion and headache. CT showed an increased size of the left frontal IPH (2.4 to  2.3 cm) with increased vasogenic edema and 3 to 5 mm midline shift. Serum Na was 128. He was admitted to ICU for hypertonic saline (3% saline) with goal Na 145 to 150. Precedex was required for severe agitation. CT on 1/21 showed: No new bleeding No worsening swelling No hydrocephalus MRI brain (1/18) benign. Neurology attributed mental status changes to TBI + delirium. Neurosurgery recommended long-term discontinuation of anticoagulation due to high fall risk. Current plan: Maintain Na >135 SBP <160 mmHg No anticoagulation Cleared by neurology Dr. Jama and neurosurgery Dr. Jacklyn for discharge today Follow-up CT and NSGY (Dr. Scharlene) in 2 to 3 weeks   2.Acute encephalopathy (TBI + delirium): resolved Workup negative for infection (  CXR clear, UA negative, no fever/leukocytosis). TSH normal. Cortisol  17.1 at midnight low concern for adrenal insufficiency. Agitation required: Precedex Sitter Mittens Haldol, Valium PRN Seroquel (neurology recommended Seroquel  Depakote) Mental status has gradually improved, though patient remains intermittently confused and somnolent.  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 Corrected with 3% saline to Na 144. Latest Na of 135. Urine study consistent with SIADH Continue monitoring Na >135. Monitor BMP Outpatient Nephrology follow up   4.Urinary retention / BPH: resolved  Required repeated straight catheterizations (>600 mL). Foley reinserted on 1/17. Removed on 1/22 Plan to continue Foley   5.Atrial fibrillation / Hypertension: stable  Anticoagulation held due to recurrent IPH. Neurosurgery recommends permanent discontinuation of Eliquis . Continue metoprolol  tartrate 25 mg BID. Norvasc started 1/16 for BP control. SBP goal <160. Maintain K >4, Mg >2.  6.Type 2 Diabetes Mellitus: stable  A1c 7.0 (02/09/24) Home Jardiance-metformin held Managed with SSI + ACHS fingersticks  7.Dysphagia / Aspiration risk SLP (1/19): soft & bite-sized with thin liquids Family requested pureed diet due to pocketing pureed diet ordered MBS (1/20): high aspiration risk; consider temporary alternative nutrition SLP (1/20 to 1/21): pureed + thin liquids recommended SLP 1/23: Dysphagia diet with pureed thin liquids   8.Nutrition / Intake Patient with poor appetite. Monitor for malnutrition. Consider nutritional supplements at SNF.  9.Mobility / Rehab Needs PT/OT recommend post-acute rehab. Patient requires assistance with all ADLs and mobility.  Patient is found stable for discharge today. Cleared by the subspeciality team for discharge.   Predictive Model Details        21% (Medium)  Factor Value   Calculated 02/18/2024 08:07 15% Number of ED visits in last 90 days 3   Readmission Risk Score v2 Model 10% Braden score 16    8%  Latest RDW in last 72 hrs 13.2 %    8% Number of active outpatient medication orders 28    7% Latest hemoglobin in last 72 hrs 14.9 g/dL    The patient's chronic medical conditions were treated accordingly per the patient's home medication regimen except as noted in the plan above and in the medication list below.    Discharge Condition:   Disposition: Patient discharged to Skilled Nursing Facility (CMS Certified) in fair condition.  Diet at discharge:    Dietary Orders  (From admission, onward)               Ordered    Adult Diet- Dysphagia; Thin liquids (no thickener added); Pureed foods (Level 4); Fluid Restricted; 1500 mL Fluid  Diet effective now       References:    Medical Nutrition Management (MNM) for Registered Dietitian  Question Answer Comment  Diet type: Dysphagia   Fluid Modification: Thin liquids (no thickener added)   Food Modification: Pureed foods (Level 4)   Fluid Restriction: Fluid Restricted   Fluid restriction dietary / 24h: 1500 mL Fluid   Medical Nutrition Management By RD: Yes, Medical Nutrition Management By RD      02/18/24 9096    Ensure Plus; Three times daily with Meals  Once       Question Answer Comment  [facility]: Ensure Plus   Frequency: Three times daily with Meals   Medical Nutrition Management By RD: Yes, Medical Nutrition Management By RD      02/11/24 1239            Activity at Discharge: Ambulate ad lib        Physical Exam  at Discharge   BP (!) 152/92 (BP Location: Right arm, Patient Position: Lying)   Pulse 83   Temp 98.6 F (37 C) (Oral)   Resp 18   Ht 1.778 m (5' 10)   Wt 77.7 kg (171 lb 4.8 oz)   SpO2 95%   BMI 24.58 kg/m  Physical exam on discharge: Vitals: Reviewed Constitutional: Patient is well developed and not in acute distress  HEENT: Head [place] and AT  Eyes: Conjunctiva normal, PERRLA Neck: Supple, No JVD  CVS: Normal rate and rhythm, Nl heart sounds, no M/R/G Lung: Nl effort , Normal breath sounds  ,no added sounds  Abdomen : Soft, non tender, non distended ,BS+ MSK: Normal range of motion  Neuro: Awake and alert  Ext: No pedal edema     Discharge Medications:      Medication List     PAUSE taking these medications    aspirin 81 mg EC tablet Wait to take this until your doctor or other care provider tells you to start again. Hold until follow up with neurosurgery.  Take 81 mg by mouth Once Daily.   Eliquis  5 mg Tab Wait to take this until your doctor or other care provider tells you to start again. Hold until follow up with Neurosurgery  Generic drug: apixaban  Take 5 mg by mouth 2 (two) times a day.   irbesartan 150 mg tablet Wait to take this until your doctor or other care provider tells you to start again. Commonly known as: AVAPRO Take 150 mg by mouth Once Daily.       START taking these medications    amLODIPine 5 mg tablet Commonly known as: NORVASC Take 1 tablet (5 mg total) by mouth daily. Start taking on: February 20, 2024       CONTINUE taking these medications    Accu-Chek Aviva Plus test strp test strip Generic drug: glucose blood CHECK BLOOD SUGAR ONCE DAILY   acetaminophen  325 mg tablet Commonly known as: TYLENOL  Take 2 tablets (650 mg total) by mouth every 6 (six) hours for 10 days.   alfuzosin  10 mg Tb24 24 hour tablet Commonly known as: UROXATRAL  Take 10 mg by mouth daily with dinner.   atorvastatin  20 mg tablet Commonly known as: LIPITOR Take 20 mg by mouth nightly.   b complex vitamins Tab tablet Take 1 tablet by mouth 2 (two) times a day.   benzonatate 100 mg capsule Commonly known as: TESSALON Take 100 mg by mouth 3 (three) times a day as needed for cough.   calcium  carbonate 1500 mg (600 mg calcium ) tablet Commonly known as: OS-CAL Take 1,500 mg by mouth daily. with a meal   cetirizine  10 mg tablet Commonly known as: ZyrTEC  Take 10 mg by mouth nightly.   chlorproMAZINE  50 mg tablet Commonly known as:  THORAZINE  Take 25 mg by mouth 3 (three) times a day as needed. Indications: hiccups that are hard to cure   coenzyme Q-10 100 mg capsule Take 100 mg by mouth 2 (two) times a day.   cromolyn 5.2 mg/spray (4 %) Spry nasal spray Commonly known as: NASALCROM Administer 2 sprays into affected nostril(s) nightly.   dextromethorphan -guaifenesin  10-100 mg/5 mL liquid Commonly known as: ROBITUSSIN-DM Take 10 mL by mouth 4 (four) times a day as needed.   insulin  lispro 100 unit/mL injection Commonly known as: HumaLOG Inject 2-14 Units under the skin 4 (four) times a day before meals and nightly. CBG: 201-250= 2 units, 251-300= 4 units, 301-350= 6  units, 351-400= 8 units, 401-450= 10 units, 451-500= 12 units, if greater than 500 give 14 units abd call NP/MD   Lancets Misc USE AS DIRECTED TO CHECK BLOOD SUGAR ONCE DAILY   levETIRAcetam 500 mg tablet Commonly known as: KEPPRA Take 1 tablet (500 mg total) by mouth 2 (two) times a day. For 3 days.   magnesium  200 mg Tab Take  by mouth.   melatonin 3 mg tablet Take 3 tablets (9 mg total) by mouth at bedtime.   metoprolol  succinate 50 mg 24 hr tablet Commonly known as: TOPROL  XL Take 50 mg by mouth Once Daily.   mirtazapine 15 mg tablet Commonly known as: REMERON Take 15 mg by mouth every evening.   polyethylene glycol 17 gram packet Commonly known as: GLYCOLAX  Take 17 g by mouth daily as needed for constipation.   QUEtiapine 25 mg tablet Commonly known as: SEROquel Take 2 tablets (50 mg total) by mouth at bedtime.   sennosides-docusate sodium  8.6-50 mg per tablet Commonly known as: PERICOLACE Take 2 tablets by mouth 2 (two) times a day.   Synjardy XR 12.5-1,000 mg Tbph Generic drug: empagliflozin-metFORMIN Take 1 tablet by mouth daily.   traMADoL 50 mg tablet Commonly known as: ULTRAM Take 25 mg by mouth every 6 (six) hours as needed for moderate pain (4-6).   triamcinolone acetonide 0.1 % cream Commonly known as:  KENALOG Apply 1 Application topically 2 (two) times a day as needed.       STOP taking these medications    amoxicillin 500 mg capsule Commonly known as: AMOXIL         Where to Get Your Medications     These medications were sent to [id number] - [place], [place] - [address] AT [facility] OF [address] - PHONE: [phone] - FAX: [phone]  [address], [place] [address]    Phone: [phone]  amLODIPine 5 mg tablet    Information about where to get these medications is not yet available   Ask your nurse or doctor about these medications acetaminophen  325 mg tablet     Significant Diagnostic Tests:   LABS:  Lab Results  Component Value Date   WBC 9.50 02/19/2024   HGB 15.4 02/19/2024   HCT 44.8 02/19/2024   PLT 279 02/19/2024   ALT 17 02/09/2024   AST 15 02/09/2024   NA 135 (L) 02/19/2024   K 4.1 02/19/2024   CL 101 02/19/2024   CREATININE 0.94 02/19/2024   BUN 18 02/19/2024   CO2 25 02/19/2024   TSH 0.921 02/09/2024   PTT 28.0 02/01/2024   INR 1.3 02/09/2024   HGBA1C 7.0 (H) 02/09/2024   IMAGING:  CT Head WO Contrast W Quant CT Tiss Character When Performed  Final Result by Christin Deliliah Curb, MD (01/13 2129)  Addendum (preliminary) 1 of 1 by Christin Deliliah Curb, MD (01/13 2129)  Date of Service: 2024-02-09 20:24:00    -----ADDENDUM-----:    Receipt of this report by the clinical staff was confirmed with Leita Phenix, MD by Nathanael Beckwith on Feb 09, 2024 21:29:00 EST.    Electronically signed by: Jantovsky, Madalyne on 02/09/2024 09:29:27 PM   [facility] [place]    -----ORIGINAL REPORT-----:    EXAM:  CT Head Without Intravenous Contrast.     CLINICAL HISTORY:  Recent ICH, AMS.    TECHNIQUE:  Axial computed tomography images of the head/brain without intravenous   contrast.  COMPARISON:  02/02/24.    FINDINGS:    BRAIN:  Previously noted left frontal lobe intraparenchymal hematomas have    slightly increased in size currently measuring up to 2.4 x 2.3 cm with   worsening vasogenic edema noted.  Mild increased mass-effect seen in the   left lateral ventricle with approximately 0.5 cm left to right midline   shift along the anterior left frontal lobe.  Close continued follow-up   recommended.    Stable subdural hemorrhage is seen along the bilateral frontal lobes and   along the bilateral parietal convexities.  Stable scattered subarachnoid   hemorrhage noted.  Stable intraventricular hemorrhage.     SOFT TISSUES:  No significant facial or scalp soft tissue swelling evident. No radiopaque   foreign body is seen.     BONES:  No acute skull fracture.     IMPRESSION:    Previously noted left frontal lobe intraparenchymal hematomas have   slightly increased in size currently measuring up to 2.4 x 2.3 cm with   worsening vasogenic edema noted.  Mild increased mass-effect seen in the   left lateral ventricle with approximately 0.5 cm left to right midline   shift along the anterior left frontal lobe.  Close continued follow-up   recommended.    Remaining intraparenchymal hemorrhages are grossly stable.    Electronically signed by: Christin Curb, MD on 02/09/2024 09:24:58 PM   [facility] [place]  Per PQRS, all CT exams are performed using one or more of the following   dose reduction techniques: automated exposure control, adjustment of the   mA and/or kV according to patient size, or use of iterative reconstruction   technique.      Final  Date of Service: 2024-02-09 20:24:00    EXAM:  CT Head Without Intravenous Contrast.     CLINICAL HISTORY:  Recent ICH, AMS.    TECHNIQUE:  Axial computed tomography images of the head/brain without intravenous   contrast.     COMPARISON:  02/02/24.    FINDINGS:    BRAIN:  Previously noted left frontal lobe intraparenchymal hematomas have   slightly increased in size currently measuring up to 2.4 x 2.3 cm with   worsening  vasogenic edema noted.  Mild increased mass-effect seen in the   left lateral ventricle with approximately 0.5 cm left to right midline   shift along the anterior left frontal lobe.  Close continued follow-up   recommended.    Stable subdural hemorrhage is seen along the bilateral frontal lobes and   along the bilateral parietal convexities.  Stable scattered subarachnoid   hemorrhage noted.  Stable intraventricular hemorrhage.     SOFT TISSUES:  No significant facial or scalp soft tissue swelling evident. No radiopaque   foreign body is seen.     BONES:  No acute skull fracture.     IMPRESSION:    Previously noted left frontal lobe intraparenchymal hematomas have   slightly increased in size currently measuring up to 2.4 x 2.3 cm with   worsening vasogenic edema noted.  Mild increased mass-effect seen in the   left lateral ventricle with approximately 0.5 cm left to right midline   shift along the anterior left frontal lobe.  Close continued follow-up   recommended.    Remaining intraparenchymal hemorrhages are grossly stable.    Electronically signed by: Christin Curb, MD on 02/09/2024 09:24:58 PM   [facility] [place]  Per PQRS, all CT exams are performed using one or more of the following   dose  reduction techniques: automated exposure control, adjustment of the   mA and/or kV according to patient size, or use of iterative reconstruction   technique.      XR Chest 1 View  Final Result by Christin Deliliah Curb, MD (01/13 2153)  Date of Service: 2024-02-09 19:53:00    EXAM:  XR Chest, 1 View.     CLINICAL HISTORY:  Generalized Weakness.    COMPARISON:  02/02/24.    FINDINGS:    LUNGS AND PLEURA:  The lungs are clear.   No pleural effusion or pneumothorax.     HEART AND MEDIASTINUM:  The heart size and mediastinal contours are normal.     BONES:  No acute osseous abnormality.     IMPRESSION:    No acute cardiopulmonary pathology.    Electronically signed by: Christin Curb,  MD on 02/09/2024 09:53:02 PM   [facility] [place]     CULTURES:  No results found for this visit on 02/09/24 (from the past week).   Surgeries/Procedures:     Consults:   IP CONSULT TO NEUROSURGERY IP CONSULT TO INTENSIVIST IP CONSULT TO NEUROLOGY   Follow-up Appointments:     Future Appointments  Date Time Provider Department Center  03/04/2024  7:55 AM [facility] CT3 [facility]  03/04/2024 10:00 AM [place] Mallie Milroy, PA-C [facility] [place]       Electronically signed by: Lamar Furnish, MD 02/19/2024 1:24 PM Time spent on discharge: 35 minutes.

## 2024-02-19 NOTE — Progress Notes (Signed)
 "  Acute Physical Therapy Treatment  PERTINENT MEDICAL INFORMATION  Precautions: Precautions Other Therapy Precautions: Fall risk Bracing: No spine precautions per Neurosurgery note on 02/10/24 Comment: ABI. Impulsive- SAGE, hx of MRSA  Medical Diagnosis/Course: PT Diagnosis/Course Pertinent Medical Course: Jose Higgins is a 84 y.o. male readmitted to [facility] from [facility] rehab and transferred to [facility] on 02/10/24 due to AMS, lethargy, worsening hyponatremia and vasogenic edema with increased confusion. Per Neurosurgery note on 1/14/26L In addition, he was complaining of headaches, which became worse with coughing.  Repeat CT head scan was obtained which revealed mildly increased size of the left frontal lobe IPH with worsening vasogenic edema.   Per previous therapy evaluation on 02/03/24, Jose Higgins is a 84 y.o. male with PMH of  HTN, A-fib on Eliquis , who presents as a level 2 trauma transfer from an outside hospital after GLF in his bathroom suspected with contributions from N and V and D (testing flu + on initial admission s/p fall). He presents  with imaging demonstrating L frontal and temp contusions and smaller R frontal contusions and bilateral SDHs 6mm on the R and 4mm on the L with small volume IVH.  nondisplaced fx from right mastoid temporal bone along the occipitomastoid suture and into the right occipital and parietal bones. Pt was treated conservatively previously with Keppra and per neurosurgery eval 1/14, no planned surgical interventions and to continue to be treated medically.  ASSESSMENT SUMMARY   Patient agreeable to therapy session. Patient able to tolerate participation with the interventions documented below. Patient making no progress towards goals secondary to unable to follow commands and poor cognition. . Will communicate with supervising PT. Will continue to follow patient per plan of care.  Problem list: Impairments/Limitations: Mobility deficits,  Activity Tolerance Deficits, Decreased knowledge of condition, Ambulation Deficits, Transfer deficits, Balance Deficits, Bed mobility deficits, Safety Awareness deficits, Coordination/Proprioception Deficits   PT Recommendation: PT Recommendation:  (daily rehab) PT Equipment Recommended: Bedside Commode, Wheelchair-manual (bed rails if return to ILF, otherwise defer to next level of care)  *Actual disposition location dependent on change in patient functional status, patient/caregiver preferences, medical status, bed availability, and insurance approval.   Maximal anticipated caregiver assistance required at DC:  Continuous one person assistance  Caregiver assistance required for:  assistance with bed mobility, assistance with transfers and/or ambulation, assistance with stairs/ramp, assistance with feeding and/or grooming, assistance with IADLs (home management, meal preparations/cooking, pet care, shopping), and assistance with transportation   Home Living Environment: Type of Home Skilled Nursing Facility [facility] SNF level most recently since d/c from hospital on 1/9 previously at independent level apt with wife)  Home Layout One level (pt's apt on second floor with elevator)  Exterior Stairs - number    Exterior Stairs - rails    Interior Stairs - number    Interior Stairs - Administrator, Sports / Tub Engineering Geologist, Grab bars in shower, Hand-held shower, Raised toilet height  Systems Analyst Currently Using none at baseline  Additional Comments pt typically lives with his wife at [employer] in an independent living apt with mod I to independent for ADls and most IADls. pt typically mobilizes without an AD, recent fall attributed to decline from flu sx.   Prior Level of Function: Level of Independence Independent (prior to head injuries)  Lives With Spouse  Person(s) able to assist at d/c Wife  24-hour  A, physically limited  Patient Responsibilities Personal ADLs, Home management, Manage Finances, Manage Medications, Meal Preparation, Social Participation  Requires Assist With     SUBJECTIVE  Subjective: No complaints  Appearance:   Pt position on arrival: In supine  Informed Consent: Patient alert, cooperative  Assessed in the Presence of: Therapy tech Merlynn  Consent for therapy provided by: Nurse  Communication:  Interdisciplinary Team Communication Communication: Patient    Lines and Leads: bed/chair exit alarm, saline lock, and telemetry  OBJECTIVE   Pain: Pain Assessment Pain Assessment: No/denies pain  Cognition: Cognition Orientation Level: Not oriented Not Oriented to: Place, Time, Situation, Person Affect/Behavior: Impacted therapy session Affect/Behavior: Confused, Lethargic Safety Awareness: Impaired Overall Cognitive Status: Impaired Arousal/Alertness: Delayed responses to stimuli Attention: Attends with cues to redirect, Difficulty attending to directions, Difficulty dividing attention Memory: Decreased recall of biographical information, Decreased recall of precautions, Decreased recall of recent events, Decreased short term memory Following Commands: Requires repetition, Requires increased time, Inconsistent following commands Insight: Decreased awareness of deficits Comments: decreased alertness and increased lethargy this morning, with poor command following  Balance: Sitting - Static: Close supervision Sitting - Dynamic: Contact Guard Assistance Standing - Static: Moderate Assistance Standing - Dynamic: Moderate Assistance Balance Additional Comments: not tested  Therapy activity vitals: stable  INTERVENTIONS  Functional Mobility:  Bed Mobility Supine to Sit: Maximal Assistance, Verbal Cues, Tactile cues, Extra time Sit to Supine: Dependent, Verbal Cues, Tactile cues, Extra time Bed Mobility Additional Comments: Attempted x 3  to transition patient to sitting at edge of bed.  Patient resistive and unable to follow commands.  Skilled PT Treatment/Education Provided  Role and POC of PT and Importance of increasing activity level/participation in therapy (Please see Functional Mobility above for educational details pertaining to bed mobility, transfers and/or gait.)   Education:  Education provided to: Patient Response to Education: Patient needs continued reinforcement  Condition After Therapy:  Back to bed, Nursing notified of condition, All needs within reach, Alarm activated, Fall interventions in place  PERFORMANCE OUTCOME MEASURES  AM-PAC - Basic Mobility:    Flowsheet Row Most Recent Value  AM-PAC 6-Clicks - Basic Mobility  Turning from you back to your side while in a flat bed without using bed rails? A little  Moving from lying on your back to sitting on the side of a flat bed without using bed rails? A little  Moving to and from a bed to a chair (including a wheelchair)? A little  Standing up from a chair using your arms (e.g, wheelchair, or bedside chair)? A lot  To walk in a hospital room? A lot  Climbing 3-5 steps with a railing? A lot  AM-PAC Total Score 15   [employer] AM-PAC 6 Clicks Basic Mobility score of 15 indicating 56.46% of functional impairment.  PT PLAN OF CARE  Rehab Potential:  Rehab Potential: Good Complexity/Co-morbidities that Impact POC: Cardiac compromise, Diabetes, Pulmonary compromise, Reduced activity level, Medications, Cognitive/Memory deficits, Neurologic Diagnosis Impairments/Limitations: Mobility deficits, Activity Tolerance Deficits, Decreased knowledge of condition, Ambulation Deficits, Transfer deficits, Balance Deficits, Bed mobility deficits, Safety Awareness deficits, Coordination/Proprioception Deficits  Plan:  Patient and Family Goals: to return to PLOF, home if able with assist per family pending insurance coverage for home health services  including rehab and aide per dtr Treatment Plan/Goals Established with Patient/Caregiver: Yes Planned Treatment/Interventions: Patient/Caregiver education, Therapeutic activity, Self care home management, Therapeutic exercise, Gait/mobility training, Neuromuscular re-education PT Frequency: 4x week PT Duration: For 3 weeks  PT Goals:  Encounter Problems     Encounter Problems (Active)     Physical Therapy     Patient will perform all bed mobility with close supervision  to reduce risk of pressure ulcers, improve safety with functional mobility, and reduce caregiver burden prior to discharge.      Start:  02/11/24    Expected End:  03/03/24             Patient will perform sit to/from stand transfers at bed, chair and commode with RW and close supervision to improve overall safety with functional mobility and decrease caregiver burden.      Start:  02/11/24    Expected End:  03/03/24             Patient will ambulate 150 feet with RW and close supervision to improve overall safety and independence for household functional mobility.       Start:  02/11/24    Expected End:  03/03/24         Patient will improve static and dynamic sitting balance to close supervision for sitting EOB x 10 minutes to allow functional activity and ADLs.       Start:  02/11/24    Expected End:  03/03/24         Patient will improve static and dynamic standing balance to close supervision with RW x 2 minutes to allow for functional activity and ADLs.      Start:  02/11/24    Expected End:  03/03/24         Caregiver will demonstrate ability to safely assist patient with bed mobility, transfers, and gait with RW.      Start:  02/11/24    Expected End:  03/03/24             Patient/caregiver will be able to verbalize fall prevention strategies to decrease risk of falls during functional mobility     Start:  02/11/24    Expected End:  03/03/24             TREATMENT TIME   Time In:  0830 Time Out: 0845 Time in Timed codes: 15 Total Treatment Time: 15     Treatment/Procedures Time Entry Therapeutic Activity minutes: 15     Charges           02/19/2024   Code Description Service Provider Modifiers Quantity  [id number] Hc Pt Therapeut Actvity Direct Pt Contact Each 15 Min Candace G Riedl, PTA GP, CQ 1        Time of Service Note Type Status  None          Acute Physical Therapy Treatment  "

## 2024-02-19 NOTE — Discharge Summary (Signed)
 [place] [facility]  Discharge Summary   Name: Jose Higgins Age: 84 yrs  MRN: [id number] DOB: 1940-07-24  Admit Date: 02/09/2024 Admitting Physician: Prentice Lamar Fiedler, MD  Discharge Date: 02/19/2024 Discharge Physician: Lamar Furnish, MD    Discharge Diagnoses:   Principal Problem (Resolved):   Altered mental status Active Problems:   Type 2 diabetes mellitus (CMD)   HTN (hypertension)   HLD (hyperlipidemia)   Intracranial hemorrhage due to trauma presenting with vasogenic edema 2/2 hyponatremia   IPH (idiopathic pulmonary hemosiderosis) (CMD)   Metabolic encephalopathy   Cerebral contusion without loss of consciousness (CMD)   Vasogenic brain edema    (CMD)   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 Change Metoprolol  Succinate 50mg  daily to Metoprolol  Tartrate 25mg  BID Jardiance-Metformin held due to acute illness and inconsistent PO intake Start Amlodipine 5mg  daily for BP Seroquel used for agitation; may continue depending on SNF protocol Haldol/Valium PRN for severe agitation (use sparingly) Continue Flomax for urinary retention Pending labs to follow up on:  Continue to monitor BMP Incidental findings requiring follow-up:  Dysphagia with aspiration risk requires ongoing SLP evaluation at SNF; consider alternative nutrition depending on intake.  Follow-up:  Persistent cerebral edema on CT despite stable hemorrhage needs repeat CT head and neurosurgery follow-up in 2 to 3 weeks (Dr. Scharlene).    Pending Labs     Order Current Status   Basic Metabolic Panel In process    Magnesium  In process   Phosphorus In process        Hospital Course:   For full details, please see H&P, progress notes, consult notes and ancillary notes. Briefly, Jose Higgins is a 84 y.o. year old male with a  PMH of Traumatic intracranial hemorrhages: bilateral frontal contusions, left temporal contusion, bilateral SDH, IVH, nondisplaced right  mastoid temporal bone fracture (from 02/01/24 fall) presented to ER for acute altered mental status due to TBI + delirium, worsened by  Hyponatremia. Being managed for following    1.Traumatic intracranial hemorrhages with recurrent edema The patient initially suffered a traumatic brain injury on 02/01/24 after a fall secondary to dehydration from diarrhea/vomiting. Initial CT showed: Left frontal & temporal contusions Right frontal contusion Bilateral subdural hematomas (6 mm R, 4 mm L) Small intraventricular hemorrhage Nondisplaced right mastoid temporal bone fracture He received Kcentra for reversal of Eliquis  and completed 1 week of Keppra. Serial CTs were stable and he was discharged to SNF on 02/06/24. He returned on 02/09/24 with worsening confusion and headache. CT showed an increased size of the left frontal IPH (2.4 to  2.3 cm) with increased vasogenic edema and 3 to 5 mm midline shift. Serum Na was 128. He was admitted to ICU for hypertonic saline (3% saline) with goal Na 145 to 150. Precedex was required for severe agitation. CT on 1/21 showed: No new bleeding No worsening swelling No hydrocephalus MRI brain (1/18) benign. Neurology attributed mental status changes to TBI + delirium. Neurosurgery recommended long-term discontinuation of anticoagulation due to high fall risk. Current plan: Maintain Na >135 SBP <160 mmHg No anticoagulation Follow-up CT and NSGY (Dr. Scharlene) in  2 to 3 weeks   2.Acute encephalopathy (TBI + delirium) Workup negative for infection (CXR clear, UA negative, no fever/leukocytosis). TSH normal.  Cortisol 17.1 at midnight low concern for adrenal insufficiency. Agitation required: Precedex Sitter Mittens Haldol, Valium PRN Seroquel (neurology recommended Seroquel  Depakote) Mental status has gradually improved, though patient remains intermittently confused and somnolent.   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 Corrected with 3% saline to Na 144. Latest Na of 135. Urine study consistent with SIADH Continue monitoring Na >135. Monitor BMP Outpatient Nephrology follow up    4.Urinary retention / BPH Required repeated straight catheterizations (>600 mL). Foley reinserted on 1/17. Removed on 1/22 Plan to continue Foley    5.Atrial fibrillation / Hypertension Anticoagulation held due to recurrent IPH. Neurosurgery recommends permanent discontinuation of Eliquis . Continue metoprolol  tartrate 25 mg BID. Norvasc started 1/16 for BP control. SBP goal <160. Maintain K >4, Mg >2.   6.Type 2 Diabetes Mellitus A1c 7.0 (02/09/24) Home Jardiance-metformin held Managed with SSI + ACHS fingersticks   7.Dysphagia / Aspiration risk SLP (1/19): soft & bite-sized with thin liquids Family requested pureed diet due to pocketing pureed diet ordered MBS (1/20): high aspiration risk; consider temporary alternative nutrition SLP (1/20 to 1/21): pureed + thin liquids recommended SLP 1/23:    8.Nutrition / Intake Patient with poor appetite. Monitor for malnutrition. Consider nutritional supplements at SNF.   9.Mobility / Rehab Needs PT/OT recommend post-acute rehab. Patient requires assistance with all ADLs and mobility.   Patient is found stable for discharge today. Cleared by the subspeciality team for discharge.     The patient's chronic medical conditions were treated accordingly per the patient's home medication regimen except as noted in the plan above and in the medication list below.    Discharge Condition:   Disposition: Patient discharged to Skilled Nursing  Facility (CMS Certified) in fair condition. Activity at Discharge: Ambulate ad lib  Diet at discharge:    Dietary Orders  (From admission, onward)               Ordered    Adult Diet- Dysphagia; Thin liquids (no thickener added); Pureed foods (Level 4); Fluid Restricted; 1500 mL Fluid  Diet effective now       References:    Medical Nutrition Management (MNM) for Registered Dietitian  Question Answer Comment  Diet type: Dysphagia   Fluid Modification: Thin liquids (no thickener added)   Food Modification: Pureed foods (Level 4)   Fluid Restriction: Fluid Restricted   Fluid restriction dietary / 24h: 1500 mL Fluid   Medical Nutrition Management By RD: Yes, Medical Nutrition Management By RD      02/18/24 9096    Ensure Plus; Three times daily with Meals  Once       Question Answer Comment  [facility]: Ensure Plus   Frequency: Three times daily with Meals   Medical Nutrition Management By RD: Yes, Medical Nutrition Management By RD      02/11/24 1239                Physical Exam at Discharge   BP 122/74 (BP Location: Right arm, Patient Position: Lying)   Pulse 98   Temp 97.3 F (36.3 C) (Axillary)   Resp 18   Ht 1.778 m (5' 10)   Wt 77.7 kg (171 lb 4.8 oz)   SpO2 95%   BMI 24.58 kg/m  GEN: NAD, lying in bed comfortably EYES: PERRLA, healing ecchymosis of orbits ENT: mucous membranes dry  CV: RRR, no murmurs appreciated PULM: CTA Bilaterally, no wheezing or rhonchi ABD: soft, NT/ND, +BS EXT: No edema in bilateral lower extremities NEURO: No focal deficits PSYCH: A+O to self GU: No CVA tenderness   Discharge Medications:      Medication List     ASK your doctor about these medications    Accu-Chek Aviva Plus test strp test strip Generic drug: glucose blood CHECK BLOOD SUGAR ONCE DAILY   acetaminophen  325 mg tablet Commonly known as: TYLENOL  Take 2 tablets (650 mg total) by mouth every 6 (six) hours for 10 days. Ask about: Should I take this  medication?   alfuzosin  10 mg Tb24 24 hour tablet Commonly known as: UROXATRAL  Take 10 mg by mouth daily with dinner.   amoxicillin 500 mg capsule Commonly known as: AMOXIL Take 4 capsules (2000 mg total) by mouth one hour prior to dental appointment   aspirin 81 mg EC tablet Wait to take this until your doctor or other care provider tells you to start again. Hold until follow up with neurosurgery.  Take 81 mg by mouth Once Daily.   atorvastatin  20 mg tablet Commonly known as: LIPITOR Take 20 mg by mouth nightly.   b complex vitamins Tab tablet Take 1 tablet by mouth 2 (two) times a day.   benzonatate 100 mg capsule Commonly known as: TESSALON Take 100 mg by mouth 3 (three) times a day as needed for cough.   calcium  carbonate 1500 mg (600 mg calcium ) tablet Commonly known as: OS-CAL Take 1,500 mg by mouth daily. with a meal   cetirizine  10 mg tablet Commonly known as: ZyrTEC  Take 10 mg by mouth nightly.   chlorproMAZINE  50 mg tablet Commonly known as: THORAZINE  Take 25 mg by mouth 3 (three) times a day as needed. Indications: hiccups that are hard to cure   coenzyme Q-10 100 mg capsule Take 100 mg by mouth 2 (two) times a day.   cromolyn 5.2 mg/spray (4 %) Spry nasal spray Commonly known as: NASALCROM Administer 2 sprays into affected nostril(s) nightly.   dextromethorphan -guaifenesin  10-100 mg/5 mL liquid Commonly known as: ROBITUSSIN-DM Take 10 mL by mouth 4 (four) times a day as needed.   Eliquis  5 mg Tab Wait to take this until your doctor or other care provider tells you to start again. Hold until follow up with Neurosurgery  Generic drug: apixaban  Take 5 mg by mouth 2 (two) times a day.   insulin  lispro 100 unit/mL injection Commonly known as: HumaLOG Inject 2-14 Units under the skin 4 (four) times a day before meals and nightly. CBG: 201-250= 2 units, 251-300= 4 units, 301-350= 6 units, 351-400= 8 units, 401-450= 10 units, 451-500= 12 units, if greater  than 500 give 14 units abd call NP/MD   irbesartan 150 mg tablet Wait to take this until your doctor or other care provider tells you to start again. Commonly known as: AVAPRO Take 150 mg by mouth Once Daily.   Lancets Misc USE AS DIRECTED TO CHECK BLOOD SUGAR ONCE DAILY   levETIRAcetam 500 mg tablet Commonly known as: KEPPRA Take 1 tablet (500 mg total) by mouth 2 (two) times a day. For 3 days.   magnesium  200 mg Tab Take  by mouth.   melatonin 3 mg tablet Take 3 tablets (9 mg total) by mouth at bedtime.   metoprolol  succinate 50 mg 24 hr tablet Commonly known as: TOPROL  XL Take 50 mg by mouth Once Daily.   mirtazapine 15 mg tablet Commonly  known as: REMERON Take 15 mg by mouth every evening.   polyethylene glycol 17 gram packet Commonly known as: GLYCOLAX  Take 17 g by mouth daily as needed for constipation.   QUEtiapine 25 mg tablet Commonly known as: SEROquel Take 2 tablets (50 mg total) by mouth at bedtime. Ask about: Which instructions should I use?   sennosides-docusate sodium  8.6-50 mg per tablet Commonly known as: PERICOLACE Take 2 tablets by mouth 2 (two) times a day.   Synjardy XR 12.5-1,000 mg Tbph Generic drug: empagliflozin-metFORMIN Take 1 tablet by mouth daily.   traMADoL 50 mg tablet Commonly known as: ULTRAM Take 25 mg by mouth every 6 (six) hours as needed for moderate pain (4-6).   triamcinolone acetonide 0.1 % cream Commonly known as: KENALOG Apply 1 Application topically 2 (two) times a day as needed.        Significant Diagnostic Tests:   LABS:  Lab Results  Component Value Date   WBC 9.50 02/19/2024   HGB 15.4 02/19/2024   HCT 44.8 02/19/2024   PLT 279 02/19/2024   ALT 17 02/09/2024   AST 15 02/09/2024   NA 134 (L) 02/18/2024   K 4.6 (H) 02/18/2024   CL 101 02/18/2024   CREATININE 0.84 02/18/2024   BUN 14 02/18/2024   CO2 27 02/18/2024   TSH 0.921 02/09/2024   PTT 28.0 02/01/2024   INR 1.3 02/09/2024   HGBA1C 7.0 (H)  02/09/2024   IMAGING:  CT Head WO Contrast W Quant CT Tiss Character When Performed  Final Result by Christin Deliliah Curb, MD (01/13 2129)  Addendum (preliminary) 1 of 1 by Christin Deliliah Curb, MD (01/13 2129)  Date of Service: 2024-02-09 20:24:00    -----ADDENDUM-----:    Receipt of this report by the clinical staff was confirmed with Leita Phenix, MD by Nathanael Beckwith on Feb 09, 2024 21:29:00 EST.    Electronically signed by: Jantovsky, Madalyne on 02/09/2024 09:29:27 PM   US [place]    -----ORIGINAL REPORT-----:    EXAM:  CT Head Without Intravenous Contrast.     CLINICAL HISTORY:  Recent ICH, AMS.    TECHNIQUE:  Axial computed tomography images of the head/brain without intravenous   contrast.     COMPARISON:  02/02/24.    FINDINGS:    BRAIN:  Previously noted left frontal lobe intraparenchymal hematomas have   slightly increased in size currently measuring up to 2.4 x 2.3 cm with   worsening vasogenic edema noted.  Mild increased mass-effect seen in the   left lateral ventricle with approximately 0.5 cm left to right midline   shift along the anterior left frontal lobe.  Close continued follow-up   recommended.    Stable subdural hemorrhage is seen along the bilateral frontal lobes and   along the bilateral parietal convexities.  Stable scattered subarachnoid   hemorrhage noted.  Stable intraventricular hemorrhage.     SOFT TISSUES:  No significant facial or scalp soft tissue swelling evident. No radiopaque   foreign body is seen.     BONES:  No acute skull fracture.     IMPRESSION:    Previously noted left frontal lobe intraparenchymal hematomas have   slightly increased in size currently measuring up to 2.4 x 2.3 cm with   worsening vasogenic edema noted.  Mild increased mass-effect seen in the   left lateral ventricle with approximately 0.5 cm left to right midline   shift along the anterior left frontal lobe.  Close continued follow-up  recommended.     Remaining intraparenchymal hemorrhages are grossly stable.    Electronically signed by: Christin Curb, MD on 02/09/2024 09:24:58 PM   US Robinette  Per PQRS, all CT exams are performed using one or more of the following   dose reduction techniques: automated exposure control, adjustment of the   mA and/or kV according to patient size, or use of iterative reconstruction   technique.      Final  Date of Service: 2024-02-09 20:24:00    EXAM:  CT Head Without Intravenous Contrast.     CLINICAL HISTORY:  Recent ICH, AMS.    TECHNIQUE:  Axial computed tomography images of the head/brain without intravenous   contrast.     COMPARISON:  02/02/24.    FINDINGS:    BRAIN:  Previously noted left frontal lobe intraparenchymal hematomas have   slightly increased in size currently measuring up to 2.4 x 2.3 cm with   worsening vasogenic edema noted.  Mild increased mass-effect seen in the   left lateral ventricle with approximately 0.5 cm left to right midline   shift along the anterior left frontal lobe.  Close continued follow-up   recommended.    Stable subdural hemorrhage is seen along the bilateral frontal lobes and   along the bilateral parietal convexities.  Stable scattered subarachnoid   hemorrhage noted.  Stable intraventricular hemorrhage.     SOFT TISSUES:  No significant facial or scalp soft tissue swelling evident. No radiopaque   foreign body is seen.     BONES:  No acute skull fracture.     IMPRESSION:    Previously noted left frontal lobe intraparenchymal hematomas have   slightly increased in size currently measuring up to 2.4 x 2.3 cm with   worsening vasogenic edema noted.  Mild increased mass-effect seen in the   left lateral ventricle with approximately 0.5 cm left to right midline   shift along the anterior left frontal lobe.  Close continued follow-up   recommended.    Remaining intraparenchymal hemorrhages are grossly stable.    Electronically signed  by: Christin Curb, MD on 02/09/2024 09:24:58 PM   US Robinette  Per PQRS, all CT exams are performed using one or more of the following   dose reduction techniques: automated exposure control, adjustment of the   mA and/or kV according to patient size, or use of iterative reconstruction   technique.      XR Chest 1 View  Final Result by Christin Deliliah Curb, MD (01/13 2153)  Date of Service: 2024-02-09 19:53:00    EXAM:  XR Chest, 1 View.     CLINICAL HISTORY:  Generalized Weakness.    COMPARISON:  02/02/24.    FINDINGS:    LUNGS AND PLEURA:  The lungs are clear.   No pleural effusion or pneumothorax.     HEART AND MEDIASTINUM:  The heart size and mediastinal contours are normal.     BONES:  No acute osseous abnormality.     IMPRESSION:    No acute cardiopulmonary pathology.    Electronically signed by: Christin Curb, MD on 02/09/2024 09:53:02 PM   US Robinette     CULTURES:  No results found for this visit on 02/09/24 (from the past week).   Consults:   IP CONSULT TO NEUROSURGERY IP CONSULT TO INTENSIVIST IP CONSULT TO NEUROLOGY   Follow-up Appointments:     Future Appointments  Date Time Provider Department Center  03/04/2024  7:55 AM [facility]  03/04/2024  10:00 AM [place] Mallie Milroy, PA-C [facility] [place]      Predictive Model Details        21% (Medium)  Factor Value   Calculated 02/19/2024 04:04 15% Number of ED visits in last 90 days 3   Readmission Risk Score v2 Model 10% Braden score 16    8% Latest RDW in last 72 hrs 13.2 %    8% Number of active outpatient medication orders 28    7% Latest hemoglobin in last 72 hrs 14.9 g/dL      Electronically signed by: Gerard JULIANNA Banks, PA Student 02/19/2024 7:46 AM

## 2024-02-19 NOTE — Care Plan (Signed)
 " Problem: Health Behavior: Goal: MCB Ability to state ways to decrease the risk of falls will be met by discharge Description: Ability to state ways to decrease the risk of falls will improve by discharge Outcome: Progressing   Problem: Safety: Goal: Will remain free from falls by discharge Description: Will remain free from falls by discharge Outcome: Progressing   Problem: Knowledge Deficit Goal: Patient/family/caregiver demonstrates understanding of disease process, treatment plan, medications, and discharge instructions Description: Complete learning assessment and assess knowledge base. Outcome: Progressing   Problem: Compromised Skin Integrity Goal: Skin integrity is maintained or improved Description: Assess and monitor skin integrity. Identify patients at risk for skin breakdown on admission and per policy. Collaborate with interdisciplinary team and initiate plans and interventions as needed.    Outcome: Progressing Goal: Fluid and electrolyte balance are achieved/maintained Description: Assess and monitor vital signs (orthostatic vitals if applicable), fluid intake and output, urine color, labs, skin turgor, mucous membranes, jugular venous distention, edema, circumference of edematous extremities and abdominal girth, respiratory status, and mental status.  Monitor for signs and symptoms of hypovolemia (tachycardia, rapid breathing, decreased urine output, postural hypotension, confusion, syncope).  Monitor for signs and symptoms of hypervolemia (strong rapid pulse, shortness of breath, difficulty breathing lying down, crackles heard in lung fields, edema). Collaborate with interdisciplinary team and initiate plan and interventions as ordered. Outcome: Progressing Goal: Nutritional status is improving Description: Monitor and assess patient for malnutrition (ex- brittle hair, bruises, dry skin, pale skin and conjunctiva, muscle wasting, smooth red tongue, and disorientation).  Collaborate with interdisciplinary team and initiate plan and interventions as ordered.  Monitor patient's weight and dietary intake as ordered or per policy. Utilize nutrition screening tool and intervene per policy. Determine patient's food preferences and provide high-protein, high-caloric foods as appropriate.  Outcome: Progressing   Problem: Urinary Incontinence Goal: Perineal skin integrity is maintained or improved Description: Assess genitourinary system, perineal skin, labs (urinalysis), and history of incontinence to include past management, aggravating, and alleviating factors.  Collaborate with interdisciplinary team and initiate plans and interventions as needed. Outcome: Progressing   Problem: Activity: Goal: Ability to ambulate will improve Description: Ability to ambulate will improve Outcome: Progressing   Problem: Self-Care: Goal: Ability to perform activities of daily living will improve Description: Ability to perform activities of daily living will improve Outcome: Progressing   Problem: Activity: Goal: Participation in diversional activities to the maximum extent possible will be supported - LTG Description: Participation in diversional activities to the maximum extent possible will be supported Outcome: Progressing   Problem: Health Behavior: Goal: Knowledge of disease or condition will improve by discharge Outcome: Progressing Goal: Understanding of proper healthcare maintenance will be met by discharge Description: Understanding of proper healthcare maintenance will be met by discharge Outcome: Progressing Goal: Verbalization of signs and symptoms of infection will improve by discharge Outcome: Progressing   Problem: Risk for Decreased Mobility Goal: Patient's mobility will be maintained/improved during hospital stay Outcome: Progressing Goal: Mobility Care consults utilized appropriately Outcome: Progressing Goal: Patient mobilizes safely throughout  hospitalization Outcome: Progressing   Problem: Coping: Goal: Ability to verbalize feelings will improve by discharge Outcome: Progressing Goal: Ability to verbalize ways to enhance comfort will improve by discharge Outcome: Progressing   Problem: Fluid Volume: Goal: Ability to maintain a balanced intake and output will improve by discharge Description: Ability to maintain a balanced intake and output will improve by discharge Outcome: Progressing   Problem: Health Behavior Goal: Understanding of treatment plan will improve by discharge  Outcome: Progressing Goal: Compliance with treatment plan for underlying cause of condition will improve by discharge Outcome: Progressing Goal: Identification of resources available to assist in meeting health care needs will improve by discharge Outcome: Progressing Goal: Ability to care for self will improve by discharge Outcome: Progressing   Problem: Urinary Elimination: Goal: Ability to recognize the need to void and respond appropriately will improve by discharge Outcome: Progressing Goal: Ability to completely empty bladder with each voiding will improve by discharge Outcome: Progressing Goal: Will remain free from infection by discharge Outcome: Progressing   "

## 2024-02-19 NOTE — Progress Notes (Signed)
 Case Management Update  Date: 02/19/2024   Time: 3:50 PM   Patient Type: Inpatient  CM spoke w/dgt [facility] [phone]) and son-n-law Elspeth [phone]) regarding D/C planning.  They would like for me to reach out to [facility] again.  CM spoke w/Barbara [phone]) at [facility]. Per Heron they are full and she does not expect a open room for at least a week or two. This information was given to [facility].  CM spoke w/Lauren w/[facility] Private Duty component [phone]) who states that they can see this patient in the home but it will be most likely Tuesday at the earliest before they can get out to the home due to the upcoming weather. Dgt DeLynn made aware.  CM spoke w/Dru at [facility] [phone]) to inquire if patient could come back there today. Per Dru they cannot take this patient sedated (patient got oral Haldol at 8:55am today). CM informed Dru that this patient is alert but confused, not in restraints, and F/C was D/C on 1/22.    Per Arletta Attending RN this patient ambulated out into the hall naked earlier this am. And per Riley Oas nurse this patient has been placed in a w/c and has been rolled around the nurses station several times.  Addendum:  Received a call back from Dru at [facility] stating that the facility will take him back for 1 week only and that the family would have to make other arrangements after that. But that they cannot take him until Monday at the earliest (no Haldol or any of those meds for 48 hrs).   MD made aware      Suzen MARLA Null, RN  BSN, Care Manager, Office #: [phone], Mobile #: [phone]

## 2024-02-19 NOTE — Progress Notes (Addendum)
 Date: 02/19/2024        Room: 779/01 Hospital Day: 10 Attending Physician: Lamar Furnish, MD  Subjective: Patient seen and examined at the bedside. Denied any active complaints today. Appetite improving . Normal bowel habits and bladder movements  No acute events overnight     Hospital Course : Jose Higgins is a 84 y.o. male with a PMH significant for Traumatic intracranial hemorrhages: bilateral frontal contusions, left temporal  contusion, bilateral SDH, IVH, nondisplaced right mastoid temporal bone fracture (from 02/01/24 fall) presented to ER for acute altered  mental status due to TBI + delirium, worsened by hyponatremia (Na 128 corrected to 144). Agitation requiring Precedex, restraints, sitter, Haldol, olanzapine, and Seroquel. Persistent urinary retention requiring Foley. Comorbidities: A-fib (previously on Eliquis ), HTN, T2DM, HLD, BPH. 1/13: Readmitted with worsening confusion, headache, lethargy. CT: size of left frontal IPH (2.4 -- 2.3 cm) with worsening edema and 3 to 5 mm midline shift. ICU admission. Started 3% saline.  Precedex for agitation. 1/14: Na 134; stable CT; remained agitated. 1/15: Na normalized to 144. Transferred out of ICU. Foley for retention; PT/OT recommended post acute rehab. 1/16 to 1/17: Persistent agitation; Foley removed then replaced for retention >600cc. Norvasc started. 1/18: MRI benign; Neurology: AMS = TBI + delirium; recommended Seroquel  Depakote. 1:1 sitter; mittens; Haldol/valium PRN. 1/19: Mental status improved but still confused. SLP: pureed diet. NSG: conservative management; long term discontinuation of anticoagulation recommended. 1/20: Mentation improving. Decreased appetite .  SLP recommending thin pureed diet  1/21: More sleepy today.  Received Haldol for agitation.  Repeat CT head shows No new bleeding,No worsening swelling and No  Hydrocephalus.  1/22: Drowsy after haldol. Na is 134 . U Osm.S Osm and U Na pending . 1/23: Cleared  by Neurology and Neuro surgery for discharge . Pending placement   Assessment & Plan Intracranial hemorrhage due to trauma presenting with vasogenic edema 2/2 hyponatremia Per NSG who has provided a great summary, presented initially 02/01/2024 after ground-level fall in the bathroom when he had multiple episodes of diarrhea and vomiting. Initially evaluated at [facility] with CT head scan that revealed sizable left frontal and left temporal lobe contusions and small right frontal contusion with bilateral subdural hematomas, 6 mm right and 4 mm left with small volume intraventricular hemorrhage associated with a nondisplaced right mastoid temporal bone fracture extending along the occipital mastoid suture line into the right occipital and parietal bone. GCS initially 14 and vital signs stable on room air. He was given Kcentra. Given that his hemorrhages had occurred on anticoagulation, he was transferred to [facility] for management. He received 1 week of Keppra. Serial CT head scans were stable and he was discharged to [facility] for rehab 02/06/2024. While hospitalized, he was seen by plastic and reconstructive surgery who did not recommend any immediate surgical intervention. He presented to [facility] 02/09/2024 after worsening headaches and decreased appetite. Repeat CT head scan revealed increased size of the left frontal lobe IPH with worsening vasogenic edema with no change in the left temporal lobe hemorrhage and no change in the right frontal contusion. There was now 3 mm left-to-right shift. In addition, his serum sodium was 128 on admission. He was admitted to the ICU for treatment with 3% saline to correct his sodium to 145-150 range. On January 14, he was requiring Precedex for agitation. This has been weaned off since and patient transferred out of the ICU on 1/16. -In terms of AMS, no pneumonia on CXR, no signs of UTI  on UA. No fever or leukocytosis to warrant checking a Bcx. TSH nl. Cortisol though checked  at midnight on 1/13 was 17.1 (though not above 18 since it is before the AM spike, low concern for adrenal insufficiency).  -MRI brain benign.  Neurology thinks this is a combination of TBI and delirium. PLAN - Repeat CT head on 02/17/24:No new bleeding,No worsening swelling and No hydrocephalus - Continue to hold anticoagulation/antiplatelets and strongly recommend long-term discontinuation of Eliquis  given his falls and gait unsteadiness. -Per neurology informal consult, pt far enough out from original TBI, but continues to have cerebral edema without midline shift on CT 1/21. Keep Na >135.  - SBP goal <160 mmHg - No spine precautions - Follow-up with Dr. Scharlene 2-3 weeks with CT head scan obtained prior to follow-up. - PT/OT - Pending placement to SNF  A-fib    (CMD) HTN (hypertension) At home, takes metoprolol  succinate 50 mg daily.  Previously on Eliquis .  - Holding off on [facility] given IPH. Likely DC of eliquis  long term.  - Continue metoprolol  to tartrate 25 mg twice daily.  - Per neurosurgery, goal SBP < 160. PRN labetalol for SBP > 160.   - Keep K > 4 and Mg > 2 - Started norvasc on 1/16 Type 2 diabetes mellitus (CMD) Last a1c results: 7.0 on 02/09/2024 - FSBS ACHS, SSI - Consistent carb diet - Hypoglycemia protocol  - Home Jardiance-metformin held HLD (hyperlipidemia) - Continue home statin BPH (benign prostatic hyperplasia) Foley placed in the ICU. Foley dc'd on 1/16. Patient has had to be in/out cath'd twice so far (retaining >600cc). Later, foley had to be placed on 1/17. Plan -Flomax -Continue foley for now.  I/O last 3 completed shifts: In: 120 [P.O.:120] Out: 975 [Urine:975] I/O this shift: In: 160 [P.O.:160] Out: -    Constipation -Possibly 2/2 medications and increased PO intake -Abdomen soft, non-tender -Miralax   Dysphagia SLP evaluated him on 1/19 and recommended soft and bite sized food with thin liquids except when talking to daughter they preferred  trying pureed food for now given their concern that patient pockets food in mouth. Pureed diet ordered.  -MBS on 1/20 recommending to consider alternative nutrition short-term, with continuiance of pureed/thin diet as patient is at significant risk for aspiration  The primary contact is:  Extended Emergency Contact Information Primary Emergency Contact: Alexander,Jennifer Mobile Phone: [phone] Relation: Daughter Secondary Emergency Contact: Boodram,Pauline Home Phone: [phone] Mobile Phone: [phone] Relation: Spouse  Code Status: Full Code   DVT ppx: Sequential Compression Devices  Discharge Planning:  The current disposition plan is discharge to SNF in 1-2 days.  Scheduled Medications: amLODIPine, 5 mg, oral, Daily atorvastatin , 20 mg, oral, Nightly insulin  aspart (NovoLOG )/insulin  lispro (HumaLOG) injection (WF), 0-12 Units, subcutaneous, TID PC metoprolol  tartrate, 25 mg, oral, Q12H SCH potassium chloride , 40 mEq, oral, Once QUEtiapine, 50 mg, oral, At Bedtime tamsulosin, 0.4 mg, oral, Daily    Prn Medications:   acetaminophen    albuterol    dextrose    dextrose    hydrALAZINE   labetalol   ondansetron   Continuous Medications:       Dietary Orders  (From admission, onward)               Ordered    Adult Diet- Dysphagia; Thin liquids (no thickener added); Pureed foods (Level 4); Fluid Restricted; 1500 mL Fluid  Diet effective now       References:    Medical Nutrition Management (MNM) for Registered Dietitian  Question Answer Comment  Diet  type: Dysphagia   Fluid Modification: Thin liquids (no thickener added)   Food Modification: Pureed foods (Level 4)   Fluid Restriction: Fluid Restricted   Fluid restriction dietary / 24h: 1500 mL Fluid   Medical Nutrition Management By RD: Yes, Medical Nutrition Management By RD      02/18/24 9096    Ensure Plus; Three times daily with Meals  Once       Question Answer Comment  [facility]: Ensure Plus    Frequency: Three times daily with Meals   Medical Nutrition Management By RD: Yes, Medical Nutrition Management By RD      02/11/24 1239            Objective: Temp:  [97.3 F (36.3 C)-98.7 F (37.1 C)] 98.4 F (36.9 C) Heart Rate:  [83-98] 90 Resp:  [18-20] 18 BP: (112-152)/(71-92) 112/71  Intake/Output Summary (Last 24 hours) at 02/19/2024 1715 Last data filed at 02/19/2024 1552 Gross per 24 hour  Intake 160 ml  Output 400 ml  Net -240 ml    Physical Exam:  General:  NAD   Respiratory: CTA, no rales, rhonchi, or wheezes  Cardiovascular: Normal S1, S2, no murmur, rub, or gallop  Gastrointestinal: Abdomen soft, nontender  Skin: Dry, intact  Neuro: Awake and alert , no focal deficit        Patient Lines/Drains/Airways Status     Active Peripherally inserted central catheter / Central venous catheter / Peripheral intravenous line / Drain / Airway / Intraosseous line / Epidural catheter / Arterial line / Line / Nasogastric/Orogastric Tube / Hemodialysis catheter / Umbilical venous catheter / Urethral Catheter / Intrauterine Pressure Catheter / Implantable Port / NHSN Urethral Catheter / NHSN Umbilical Catheter / Intentionally Retained Foreign Objects / AV Fistula / Peripheral Nerve Catheter     Name Placement date Placement time Site Days   Peripheral IV 02/10/24 16 G Distal;Left;Lateral Arm 02/10/24  1003  Arm  9           Lab Results  Component Value Date   WBC 9.50 02/19/2024   HGB 15.4 02/19/2024   HCT 44.8 02/19/2024   MCV 88.8 02/19/2024   PLT 279 02/19/2024   Lab Results  Component Value Date   GLUCOSE 173 (H) 02/19/2024   CALCIUM  9.5 02/19/2024   NA 135 (L) 02/19/2024   K 4.1 02/19/2024   CO2 25 02/19/2024   CL 101 02/19/2024   BUN 18 02/19/2024   CREATININE 0.94 02/19/2024   Lab Results  Component Value Date   ALT 17 02/09/2024   AST 15 02/09/2024   GGT 11 02/01/2024   BILITOT 1.8 (H) 02/09/2024   Lab Results  Component Value Date    INR 1.3 02/09/2024   INR 1.0 02/01/2024   INR 1.3 02/01/2024   PROTIME 16.0 (H) 02/09/2024   PROTIME 11.4 02/01/2024   PROTIME 16.5 (H) 02/01/2024   No results found for this visit on 02/09/24 (from the past week).  Electronically signed by: Lamar Furnish, MD 02/19/2024 5:15 PM

## 2024-02-20 NOTE — Progress Notes (Addendum)
 Date: 02/20/2024        Room: 779/01 Hospital Day: 11 Attending Physician: Lamar Furnish, MD  Subjective: Patient seen and examined at the bedside. Lying in the bed and not in acute distress. Complaining of neck pain . No fever or trauma  Appetite improving . Sometimes Normal bowel habits and bladder movements  No acute events overnight     Hospital Course : Jose Higgins is a 84 y.o. male with a PMH significant for Traumatic intracranial hemorrhages: bilateral frontal contusions, left  temporal contusion, bilateral SDH, IVH, nondisplaced right mastoid temporal bone fracture (from 02/01/24 fall) presented to ER for  acute altered mental status due to TBI + delirium, worsened by hyponatremia (Na 128 corrected to 144). Agitation requiring Precedex, restraints, sitter, Haldol, olanzapine, and Seroquel. Persistent urinary retention requiring Foley. Comorbidities: A-fib (previously on Eliquis ), HTN, T2DM, HLD, BPH. 1/13: Readmitted with worsening confusion, headache, lethargy. CT: size of left frontal IPH (2.4 -- 2.3 cm) with worsening edema and 3 to 5 mm midline shift. ICU admission. Started 3% saline.  Precedex for agitation. 1/14: Na 134; stable CT; remained agitated. 1/15: Na normalized to 144. Transferred out of ICU. Foley for retention; PT/OT recommended post acute rehab. 1/16 to 1/17: Persistent agitation; Foley removed then replaced for retention >600cc. Norvasc started. 1/18: MRI benign; Neurology: AMS = TBI + delirium; recommended Seroquel  Depakote. 1:1 sitter; mittens; Haldol/valium PRN. 1/19: Mental status improved but still confused. SLP: pureed diet. NSG: conservative management; long term discontinuation of anticoagulation recommended. 1/20: Mentation improving. Decreased appetite .  SLP recommending thin pureed diet  1/21: More sleepy today.  Received Haldol for agitation.  Repeat CT head shows No new bleeding,No worsening swelling and No  Hydrocephalus.  1/22: Drowsy after  haldol. Na is 134 . U Osm.S Osm and U Na pending . 1/23: Cleared by Neurology and Neuro surgery for discharge . Off Haldol and Foleys.  Pending placement  1/24: Medically cleared for discharge pending placement. Neck pain   Assessment & Plan Intracranial hemorrhage due to trauma presenting with vasogenic edema 2/2 hyponatremia Per NSG who has provided a great summary, presented initially 02/01/2024 after ground-level fall in the bathroom when he had multiple episodes of diarrhea and vomiting. Initially evaluated at [facility] with CT head scan that revealed sizable left frontal and left temporal lobe contusions and small right frontal contusion with bilateral subdural hematomas, 6 mm right and 4 mm left with small volume intraventricular hemorrhage associated with a nondisplaced right mastoid temporal bone fracture extending along the occipital mastoid suture line into the right occipital and parietal bone. GCS initially 14 and vital signs stable on room air. He was given Kcentra. Given that his hemorrhages had occurred on anticoagulation, he was transferred to [facility] for management. He received 1 week of Keppra. Serial CT head scans were stable and he was discharged to [facility] for rehab 02/06/2024. While hospitalized, he was seen by plastic and reconstructive surgery who did not recommend any immediate surgical intervention. He presented to [facility] 02/09/2024 after worsening headaches and decreased appetite. Repeat CT head scan revealed increased size of the left frontal lobe IPH with worsening vasogenic edema with no change in the left temporal lobe hemorrhage and no change in the right frontal contusion. There was now 3 mm left-to-right shift. In addition, his serum sodium was 128 on admission. He was admitted to the ICU for treatment with 3% saline to correct his sodium to 145-150 range. On January 14, he was requiring  Precedex for agitation. This has been weaned off since and patient transferred out of the  ICU on 1/16. -In terms of AMS, no pneumonia on CXR, no signs of UTI on UA. No fever or leukocytosis to warrant checking a Bcx. TSH nl. Cortisol though checked at midnight on 1/13 was 17.1 (though not above 18 since it is before the AM spike, low concern for adrenal insufficiency).  -MRI brain benign.  Neurology thinks this is a combination of TBI and delirium. PLAN - Repeat CT head on 02/17/24:No new bleeding,No worsening swelling and No hydrocephalus - Continue to hold anticoagulation/antiplatelets and strongly recommend long-term discontinuation of Eliquis  given his falls and gait unsteadiness. -Per neurology informal consult, pt far enough out from original TBI, but continues to have cerebral edema without midline shift on CT 1/21. Keep Na >135.  - SBP goal <160 mmHg - No spine precautions - Follow-up with Dr. Scharlene 2-3 weeks with CT head scan obtained prior to follow-up. - PT/OT - Pending placement to SNF  A-fib    (CMD) HTN (hypertension) At home, takes metoprolol  succinate 50 mg daily.  Previously on Eliquis .  - Holding off on [facility] given IPH. Likely DC of eliquis  long term.  - Continue metoprolol  to tartrate 25 mg twice daily.  - Per neurosurgery, goal SBP < 160. PRN labetalol for SBP > 160.   - Keep K > 4 and Mg > 2 - Started norvasc on 1/16 Type 2 diabetes mellitus (CMD) Last a1c results: 7.0 on 02/09/2024 - FSBS ACHS, SSI - Consistent carb diet - Hypoglycemia protocol  - Home Jardiance-metformin held HLD (hyperlipidemia) - Continue home statin BPH (benign prostatic hyperplasia) Foley placed in the ICU. Foley dc'd on 1/16. Patient has had to be in/out cath'd twice so far (retaining >600cc). Later, foley had to be placed on 1/17. Plan -Flomax - Foley removed on 02/19/24  I/O last 3 completed shifts: In: 210 [P.O.:210] Out: 825 [Urine:825] No intake/output data recorded.   Constipation -Possibly 2/2 medications and increased PO intake -Abdomen soft,  non-tender -Miralax   Dysphagia SLP evaluated him on 1/19 and recommended soft and bite sized food with thin liquids except when talking to daughter they preferred trying pureed food for now given their concern that patient pockets food in mouth. Pureed diet ordered.  -MBS on 1/20 recommending to consider alternative nutrition short-term, with continuiance of pureed/thin diet as patient is at significant risk for aspiration -After a shared decision-making discussion involving the care team and the family, we collectively agreed that proceeding with PEG tube is not appropriate given the risk of inadvertent line removal and the family's expressed concerns. -Aspiration precautions   The primary contact is:  Extended Emergency Contact Information Primary Emergency Contact: Alexander,Jennifer Mobile Phone: [phone] Relation: Daughter Secondary Emergency Contact: Ortez,Pauline Home Phone: [phone] Mobile Phone: [phone] Relation: Spouse  Code Status: Full Code   DVT ppx: Sequential Compression Devices  Discharge Planning:  The current disposition plan is discharge to SNF in 1-2 days.  Scheduled Medications: amLODIPine, 5 mg, oral, Daily atorvastatin , 20 mg, oral, Nightly insulin  aspart (NovoLOG )/insulin  lispro (HumaLOG) injection (WF), 0-12 Units, subcutaneous, TID PC metoprolol  tartrate, 25 mg, oral, Q12H SCH potassium chloride , 40 mEq, oral, Once QUEtiapine, 50 mg, oral, At Bedtime tamsulosin, 0.4 mg, oral, Daily    Prn Medications:   acetaminophen    albuterol    dextrose    dextrose    hydrALAZINE   labetalol   ondansetron   Continuous Medications:       Dietary Orders  (From  admission, onward)               Ordered    Adult Diet- Dysphagia; Thin liquids (no thickener added); Pureed foods (Level 4); Fluid Restricted; 1500 mL Fluid  Diet effective now       References:    Medical Nutrition Management (MNM) for Registered Dietitian  Question Answer  Comment  Diet type: Dysphagia   Fluid Modification: Thin liquids (no thickener added)   Food Modification: Pureed foods (Level 4)   Fluid Restriction: Fluid Restricted   Fluid restriction dietary / 24h: 1500 mL Fluid   Medical Nutrition Management By RD: Yes, Medical Nutrition Management By RD      02/18/24 9096    Ensure Plus; Three times daily with Meals  Once       Question Answer Comment  [facility]: Ensure Plus   Frequency: Three times daily with Meals   Medical Nutrition Management By RD: Yes, Medical Nutrition Management By RD      02/11/24 1239            Objective: Temp:  [97.4 F (36.3 C)-98.6 F (37 C)] 97.8 F (36.6 C) Heart Rate:  [83-105] 102 Resp:  [16-18] 18 BP: (105-152)/(68-92) 111/72  Intake/Output Summary (Last 24 hours) at 02/20/2024 9092 Last data filed at 02/20/2024 9262 Gross per 24 hour  Intake 210 ml  Output 425 ml  Net -215 ml    Physical Exam:  General:  NAD   Respiratory: CTA, no rales, rhonchi, or wheezes  Cardiovascular: Normal S1, S2, no murmur, rub, or gallop  Gastrointestinal: Abdomen soft, nontender  Skin: Dry, intact  Neuro: Awake and alert        Patient Lines/Drains/Airways Status     Active Peripherally inserted central catheter / Central venous catheter / Peripheral intravenous line / Drain / Airway / Intraosseous line / Epidural catheter / Arterial line / Line / Nasogastric/Orogastric Tube / Hemodialysis catheter / Umbilical venous catheter / Urethral Catheter / Intrauterine Pressure Catheter / Implantable Port / NHSN Urethral Catheter / NHSN Umbilical Catheter / Intentionally Retained Foreign Objects / AV Fistula / Peripheral Nerve Catheter     Name Placement date Placement time Site Days   Peripheral IV 02/10/24 16 G Distal;Left;Lateral Arm 02/10/24  1003  Arm  9           Lab Results  Component Value Date   WBC 9.50 02/19/2024   HGB 15.4 02/19/2024   HCT 44.8 02/19/2024   MCV 88.8 02/19/2024   PLT 279 02/19/2024    Lab Results  Component Value Date   GLUCOSE 173 (H) 02/19/2024   CALCIUM  9.5 02/19/2024   NA 135 (L) 02/19/2024   K 4.1 02/19/2024   CO2 25 02/19/2024   CL 101 02/19/2024   BUN 18 02/19/2024   CREATININE 0.94 02/19/2024   Lab Results  Component Value Date   ALT 17 02/09/2024   AST 15 02/09/2024   GGT 11 02/01/2024   BILITOT 1.8 (H) 02/09/2024   Lab Results  Component Value Date   INR 1.3 02/09/2024   INR 1.0 02/01/2024   INR 1.3 02/01/2024   PROTIME 16.0 (H) 02/09/2024   PROTIME 11.4 02/01/2024   PROTIME 16.5 (H) 02/01/2024   No results found for this visit on 02/09/24 (from the past week).  Electronically signed by: Lamar Furnish, MD 02/20/2024 9:07 AM

## 2024-02-21 NOTE — Progress Notes (Signed)
 Date: 02/21/2024        Room: 779/01 Hospital Day: 12 Attending Physician: Lamar Furnish, MD  Subjective: Patient seen and examined at the bedside. He is lying on the chair and not in acute distress. Denied any chest pain, SOB,headache, nausea or vomiting  Appetite improving . Sometimes Normal bowel habits and bladder movements  No acute events overnight     Hospital Course : Jose Higgins is a 84 y.o. male with a PMH significant for Traumatic intracranial hemorrhages: bilateral frontal contusions, left  temporal contusion, bilateral SDH, IVH, nondisplaced right mastoid temporal bone fracture (from 02/01/24 fall) presented to ER for  acute altered mental status due to TBI + delirium, worsened by hyponatremia (Na 128 corrected to 144). Agitation requiring Precedex, restraints, sitter, Haldol, olanzapine, and Seroquel. Persistent urinary retention requiring Foley. Comorbidities: A-fib (previously on Eliquis ), HTN, T2DM, HLD, BPH. 1/13: Readmitted with worsening confusion, headache, lethargy. CT: size of left frontal IPH (2.4 -- 2.3 cm) with worsening edema and 3 to 5 mm midline shift. ICU admission. Started 3% saline.  Precedex for agitation. 1/14: Na 134; stable CT; remained agitated. 1/15: Na normalized to 144. Transferred out of ICU. Foley for retention; PT/OT recommended post acute rehab. 1/16 to 1/17: Persistent agitation; Foley removed then replaced for retention >600cc. Norvasc started. 1/18: MRI benign; Neurology: AMS = TBI + delirium; recommended Seroquel  Depakote. 1:1 sitter; mittens; Haldol/valium PRN. 1/19: Mental status improved but still confused. SLP: pureed diet. NSG: conservative management; long term discontinuation of anticoagulation recommended. 1/20: Mentation improving. Decreased appetite .  SLP recommending thin pureed diet  1/21: More sleepy today.  Received Haldol for agitation.  Repeat CT head shows No new bleeding,No worsening swelling and No  Hydrocephalus.   1/22: Drowsy after haldol. Na is 134 . U Osm.S Osm and U Na pending . 1/23: Cleared by Neurology and Neuro surgery for discharge . Off Haldol and Foleys.  Pending placement  1/24: Medically cleared for discharge pending placement. 1/25: Medically cleared for discharge pending placement.  Assessment & Plan Intracranial hemorrhage due to trauma presenting with vasogenic edema 2/2 hyponatremia Per NSG who has provided a great summary, presented initially 02/01/2024 after ground-level fall in the bathroom when he had multiple episodes of diarrhea and vomiting. Initially evaluated at [facility] with CT head scan that revealed sizable left frontal and left temporal lobe contusions and small right frontal contusion with bilateral subdural hematomas, 6 mm right and 4 mm left with small volume intraventricular hemorrhage associated with a nondisplaced right mastoid temporal bone fracture extending along the occipital mastoid suture line into the right occipital and parietal bone. GCS initially 14 and vital signs stable on room air. He was given Kcentra. Given that his hemorrhages had occurred on anticoagulation, he was transferred to [facility] for management. He received 1 week of Keppra. Serial CT head scans were stable and he was discharged to [facility] for rehab 02/06/2024. While hospitalized, he was seen by plastic and reconstructive surgery who did not recommend any immediate surgical intervention. He presented to [facility] 02/09/2024 after worsening headaches and decreased appetite. Repeat CT head scan revealed increased size of the left frontal lobe IPH with worsening vasogenic edema with no change in the left temporal lobe hemorrhage and no change in the right frontal contusion. There was now 3 mm left-to-right shift. In addition, his serum sodium was 128 on admission. He was admitted to the ICU for treatment with 3% saline to correct his sodium to 145-150 range. On  January 14, he was requiring Precedex for agitation.  This has been weaned off since and patient transferred out of the ICU on 1/16. -In terms of AMS, no pneumonia on CXR, no signs of UTI on UA. No fever or leukocytosis to warrant checking a Bcx. TSH nl. Cortisol though checked at midnight on 1/13 was 17.1 (though not above 18 since it is before the AM spike, low concern for adrenal insufficiency).  -MRI brain benign.  Neurology thinks this is a combination of TBI and delirium. PLAN - Repeat CT head on 02/17/24:No new bleeding,No worsening swelling and No hydrocephalus - Continue to hold anticoagulation/antiplatelets and strongly recommend long-term discontinuation of Eliquis  given his falls and gait unsteadiness. -Per neurology informal consult, pt far enough out from original TBI, but continues to have cerebral edema without midline shift on CT 1/21. Keep Na >135.  - SBP goal <160 mmHg - No spine precautions - Follow-up with Dr. Scharlene 2-3 weeks with CT head scan obtained prior to follow-up. - PT/OT - Pending placement to SNF  A-fib    (CMD) HTN (hypertension) At home, takes metoprolol  succinate 50 mg daily.  Previously on Eliquis .  - Holding off on [facility] given IPH. Likely DC of eliquis  long term.  - Continue metoprolol  to tartrate 25 mg twice daily.  - Per neurosurgery, goal SBP < 160. PRN labetalol for SBP > 160.   - Keep K > 4 and Mg > 2 - Started norvasc on 1/16 Type 2 diabetes mellitus (CMD) Last a1c results: 7.0 on 02/09/2024 - FSBS ACHS, SSI - Consistent carb diet - Hypoglycemia protocol  - Home Jardiance-metformin held HLD (hyperlipidemia) - Continue home statin BPH (benign prostatic hyperplasia) Foley placed in the ICU. Foley dc'd on 1/16. Patient has had to be in/out cath'd twice so far (retaining >600cc). Later, foley had to be placed on 1/17. Plan -Flomax - Foley removed on 02/19/24  I/O last 3 completed shifts: In: 150 [P.O.:150] Out: 1038 [Urine:1038] No intake/output data recorded.   Constipation -Possibly 2/2  medications and increased PO intake -Abdomen soft, non-tender -Miralax   Dysphagia SLP evaluated him on 1/19 and recommended soft and bite sized food with thin liquids except when talking to daughter they preferred trying pureed food for now given their concern that patient pockets food in mouth. Pureed diet ordered.  -MBS on 1/20 recommending to consider alternative nutrition short-term, with continuiance of pureed/thin diet as patient is at significant risk for aspiration -After a shared decision-making discussion involving the care team and the family, we collectively agreed that proceeding with PEG tube is not appropriate given the risk of inadvertent line removal and the family's expressed concerns. -Aspiration precautions   The primary contact is:  Extended Emergency Contact Information Primary Emergency Contact: Alexander,Jennifer Mobile Phone: [phone] Relation: Daughter Secondary Emergency Contact: Hulsey,Pauline Home Phone: [phone] Mobile Phone: [phone] Relation: Spouse  Code Status: Full Code   DVT ppx: Sequential Compression Devices  Discharge Planning:  The current disposition plan is discharge to SNF in 1-2 days.  Scheduled Medications: amLODIPine, 5 mg, oral, Daily atorvastatin , 20 mg, oral, Nightly insulin  aspart (NovoLOG )/insulin  lispro (HumaLOG) injection (WF), 0-12 Units, subcutaneous, TID PC metoprolol  tartrate, 25 mg, oral, Q12H SCH potassium chloride , 40 mEq, oral, Once QUEtiapine, 50 mg, oral, At Bedtime tamsulosin, 0.4 mg, oral, Daily    Prn Medications:   acetaminophen    albuterol    dextrose    dextrose    hydrALAZINE   labetalol   ondansetron   Continuous Medications:  Dietary Orders  (From admission, onward)               Ordered    Adult Diet- Dysphagia; Thin liquids (no thickener added); Pureed foods (Level 4); Fluid Restricted; 1500 mL Fluid  Diet effective now       References:    Medical Nutrition  Management (MNM) for Registered Dietitian  Question Answer Comment  Diet type: Dysphagia   Fluid Modification: Thin liquids (no thickener added)   Food Modification: Pureed foods (Level 4)   Fluid Restriction: Fluid Restricted   Fluid restriction dietary / 24h: 1500 mL Fluid   Medical Nutrition Management By RD: Yes, Medical Nutrition Management By RD      02/18/24 9096    Ensure Plus; Three times daily with Meals  Once       Question Answer Comment  [facility]: Ensure Plus   Frequency: Three times daily with Meals   Medical Nutrition Management By RD: Yes, Medical Nutrition Management By RD      02/11/24 1239            Objective: Temp:  [97.5 F (36.4 C)-97.9 F (36.6 C)] 97.5 F (36.4 C) Heart Rate:  [94-103] 103 Resp:  [18-20] 18 BP: (97-111)/(64-76) 99/70  Intake/Output Summary (Last 24 hours) at 02/21/2024 1013 Last data filed at 02/21/2024 0800 Gross per 24 hour  Intake 100 ml  Output 613 ml  Net -513 ml    Physical Exam:  General:  NAD   Respiratory: CTA, no rales, rhonchi, or wheezes  Cardiovascular: Normal S1, S2, no murmur, rub, or gallop  Gastrointestinal: Abdomen soft, nontender  Skin: Dry, intact  Neuro: Awake and alert        Patient Lines/Drains/Airways Status     Active Peripherally inserted central catheter / Central venous catheter / Peripheral intravenous line / Drain / Airway / Intraosseous line / Epidural catheter / Arterial line / Line / Nasogastric/Orogastric Tube / Hemodialysis catheter / Umbilical venous catheter / Urethral Catheter / Intrauterine Pressure Catheter / Implantable Port / NHSN Urethral Catheter / NHSN Umbilical Catheter / Intentionally Retained Foreign Objects / AV Fistula / Peripheral Nerve Catheter     Name Placement date Placement time Site Days   Peripheral IV 02/10/24 16 G Distal;Left;Lateral Arm 02/10/24  1003  Arm  11           Lab Results  Component Value Date   WBC 9.50 02/19/2024   HGB 15.4 02/19/2024   HCT  44.8 02/19/2024   MCV 88.8 02/19/2024   PLT 279 02/19/2024   Lab Results  Component Value Date   GLUCOSE 173 (H) 02/19/2024   CALCIUM  9.5 02/19/2024   NA 135 (L) 02/19/2024   K 4.1 02/19/2024   CO2 25 02/19/2024   CL 101 02/19/2024   BUN 18 02/19/2024   CREATININE 0.94 02/19/2024   Lab Results  Component Value Date   ALT 17 02/09/2024   AST 15 02/09/2024   GGT 11 02/01/2024   BILITOT 1.8 (H) 02/09/2024   Lab Results  Component Value Date   INR 1.3 02/09/2024   INR 1.0 02/01/2024   INR 1.3 02/01/2024   PROTIME 16.0 (H) 02/09/2024   PROTIME 11.4 02/01/2024   PROTIME 16.5 (H) 02/01/2024   No results found for this visit on 02/09/24 (from the past week).  Electronically signed by: Lamar Furnish, MD 02/21/2024 10:13 AM

## 2024-02-21 NOTE — Progress Notes (Signed)
 Case Management Update  Date: 02/21/2024   Time: 4:06 PM   Patient Type: Inpatient  Received a secure chat message from Dr. Sherron asking Does his SNF takes with foleys?   CM contacted Dru, [facility], [phone]) to ask if patient can return to facility with a foley catheter in place. Per Dru, pt can return to facility with a f/c and Dr. Sherron informed of above statement.      Post Acute Placement Status: Review currently in progress.  Case Management Coordination Status: Coordination In-Progress    Anticipated Discharge Location: Skilled Nursing Facility - short-term rehab  If Plan A discharging location is not feasible: Potential Plan B: Home with Home Health         Sabrina CHRISTELLA Platts, RN

## 2024-02-22 NOTE — Progress Notes (Signed)
 Case Management Update  Date: 02/22/2024   Time: 3:12 PM   Patient Type: Inpatient  FL2 completed for SNF placement; previous PASARR authorization added.    Jose Higgins, CCA

## 2024-02-22 NOTE — Progress Notes (Addendum)
 Case Management Update  Date: 02/22/2024   Time: 3:11 PM   Patient Type: Inpatient  Addendum 1554: Call back received from Dru with the facility,  has confirmed they will have a bed available tomorrow for pt - facility currently full, but will have a discharge in the morning and will accept pt in the afternoon  --------------- Addendum 1545: CM reached out to Dru at [facility] [phone] who has advised she will speak to her DON about accepting pt back today and follow back up with CM. ------------------ CM reached out to pt's daughter (Delynn) to advise of plan for discharge to [facility] today - family is on board with d/c plan.  CM reached out to [facility] x2, regarding ability to admit to SNF today (1/26) VM left with contact information for call back.  Auth resubmitted to HTA - pt does have grace days available at [facility].  Post Acute Placement Status: Review currently in progress.  Case Management Coordination Status: Coordination In-Progress    Anticipated Discharge Location: Skilled Nursing Facility - short-term rehab  If Plan A discharging location is not feasible: Potential Plan B: Assisted Living Facility         [place], [place], [place] Care Coordination [phone]

## 2024-02-22 NOTE — Progress Notes (Signed)
 " Acute Physical Therapy Treatment  PERTINENT MEDICAL INFORMATION  Precautions: Precautions Other Therapy Precautions: Fall risk Bracing: No spine precautions per Neurosurgery note on 02/10/24 Comment: ABI. Impulsive- SAGE, hx of MRSA  Medical Diagnosis/Course: PT Diagnosis/Course Pertinent Medical Course: Jose Higgins is a 84 y.o. male readmitted to [facility] from [facility] rehab and transferred to [facility] on 02/10/24 due to AMS, lethargy, worsening hyponatremia and vasogenic edema with increased confusion. Per Neurosurgery note on 1/14/26L In addition, he was complaining of headaches, which became worse with coughing.  Repeat CT head scan was obtained which revealed mildly increased size of the left frontal lobe IPH with worsening vasogenic edema.   Per previous therapy evaluation on 02/03/24, Jose Higgins is a 84 y.o. male with PMH of  HTN, A-fib on Eliquis , who presents as a level 2 trauma transfer from an outside hospital after GLF in his bathroom suspected with contributions from N and V and D (testing flu + on initial admission s/p fall). He presents  with imaging demonstrating L frontal and temp contusions and smaller R frontal contusions and bilateral SDHs 6mm on the R and 4mm on the L with small volume IVH.  nondisplaced fx from right mastoid temporal bone along the occipitomastoid suture and into the right occipital and parietal bones. Pt was treated conservatively previously with Keppra and per neurosurgery eval 1/14, no planned surgical interventions and to continue to be treated medically. 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 CNA states this has been improving and he has been able to transition to and from the bathroom with RW with her. BP monitored closely due to wooziness per patient (see vitals). Overall he demonstrated decreased physical assistance,  decreased resistance to mobility, and improved command following with progression of functional mobility and gait. The patient would continue to benefit from skilled PT services to address balance, safety, and functional independence as patient is at a high risk for falls.   Problem list: Impairments/Limitations: Mobility deficits, Activity Tolerance Deficits, Decreased knowledge of condition, Ambulation Deficits, Transfer deficits, Balance Deficits, Bed mobility deficits, Safety Awareness deficits, Coordination/Proprioception Deficits   PT Recommendation: PT Recommendation:  (daily rehab) PT Equipment Recommended: Bedside Commode, Wheelchair-manual (bed rails if return to ILF, otherwise defer to next level of care)  *Actual disposition location dependent on change in patient functional status, patient/caregiver preferences, medical status, bed availability, and insurance approval.   Maximal anticipated caregiver assistance required at DC:  Continuous one person supervision  Caregiver assistance required for:  assistance with bed mobility, assistance with transfers and/or ambulation, and assistance with stairs/ramp   Home Living Environment: Type of Home Skilled Nursing Facility [facility] SN[facility] level most recently since d/c from hospital on 1/9 previously at independent level apt with wife)  Home Layout One level (pt's apt on second floor with elevator)  Exterior Stairs - number    Exterior Stairs - rails    Interior Stairs - number    Interior Stairs - Administrator, Sports / Tub Engineering Geologist, Grab bars in shower, Hand-held shower, Raised toilet height  Systems Analyst Currently Using none at baseline  Additional Comments pt typically lives with his wife at [facility] in an independent living apt with mod I to independent for ADls and most IADls. pt typically mobilizes without an AD, recent fall attributed  to decline from flu sx.   Prior Level of Function: Level of Independence Independent (prior to head injuries)  Lives With Spouse  Person(s) able to assist at d/c Wife 24-hour A, physically limited  Patient Responsibilities Personal ADLs, Home management, Manage Finances, Manage Medications, Meal Preparation, Social Participation  Requires Assist With     SUBJECTIVE  Subjective: The patient notes he feels woozy  Appearance:   Pt position on arrival: Seated in recliner  Informed Consent: Patient and Nurse: Primary alert, cooperative  Assessed in the Presence of: Therapy tech Merlynn  Consent for therapy provided by: Nurse  Communication:  Interdisciplinary Team Communication Communication: Patient Communication Details: See further in chart General Family/Caregiver Present: None Was an interpreter used?: No  Lines and Leads: saline lock and telemetry  OBJECTIVE   Pain: Pain Assessment Pain Assessment: Wong-Baker FACES Wong-Baker FACES Pain Rating: Hurts little more Pain Type:  (Unknown) Pain Location: Neck  Cognition: Cognition Orientation Level: Not oriented Not Oriented to: Place, Time, Situation Affect/Behavior: Impacted therapy session Affect/Behavior: Confused, Lethargic Safety Awareness: Impaired Overall Cognitive Status: Impaired Arousal/Alertness: Delayed responses to stimuli Attention: Attends with cues to redirect, Difficulty attending to directions, Difficulty dividing attention Memory: Decreased recall of biographical information, Decreased recall of precautions, Decreased recall of recent events, Decreased short term memory Following Commands: Requires increased time, Requires repetition Insight: Decreased awareness of deficits Comments: decreased alertness and increased lethargy this morning, with poor command following   Balance: Sitting - Static: Close supervision Sitting - Dynamic: Contact Guard Assistance Standing - Static: Minimal  Assistance Standing - Dynamic: Minimal Assistance Balance Additional Comments: Multiple losses of balance posterior with use of RW requiring assistance for management of base of support.  Therapy activity vitals: Pre activity BP: 104/61 MAP: 73 HR: 92 With activity HR: 107 immediately post ambulation Post activity BP: 100/59 Hr: 96 MAP 68 HR: 107  INTERVENTIONS  Functional Mobility:  Bed Mobility Bed Mobility Additional Comments: Patient sitting in recliner upon PT entering. Transfers Assistive Device: Walker-rolling Sit to Stand: Minimal Assistance Stand to Sit: Minimal Assistance Transfers  Additional Comments: Increased tactile and verbal cues for proper hand positioning when transitioning sit<>sit; RW present with education for nose over toes due to poor anteriorly weight shift with mulitple posterior balance losses upon transition into standing. Poor eccentric control noted descending with cueing for glut/quad activation to assist with control. Mobility Gait Distance (ft): 80 ft Gait Additional Comments: Pre gait activities: lateral weights shifts, standing marches (poor foot clearance and single limb weight acceptance), and anterior/posterior weight shifts with RW support. Progression to gait with narrow base of support, decreased step length, and decreased management of RW requiring assistance at times. Multiple losses of balance with 90 and 180 degree turns requiring minimal assist to re-right to midline. Gait Assistance: Minimal Assistance (Chair follow) Assistive Device: Walker-rolling  Skilled PT Treatment/Education Provided  Role and POC of PT, Call and wait for assistance, and Fall Prevention (Please see Functional Mobility above for educational details pertaining to bed mobility, transfers and/or gait.)   Education:  Education provided to: Patient Response to Education: Patient needs continued reinforcement  Condition After Therapy:  Up in chair, Nursing notified  of condition, All needs within reach, Alarm activated, Fall interventions in place, Lines intact  PERFORMANCE OUTCOME MEASURES  AM-PAC - Basic Mobility:    Flowsheet Row Most Recent Value  AM-PAC 6-Clicks - Basic Mobility  Turning from you back to your side while in a flat bed without using bed rails? A  little  Moving from lying on your back to sitting on the side of a flat bed without using bed rails? A little  Moving to and from a bed to a chair (including a wheelchair)? A little  Standing up from a chair using your arms (e.g, wheelchair, or bedside chair)? A little  To walk in a hospital room? A little  Climbing 3-5 steps with a railing? A lot  AM-PAC Total Score 17   [employer] AM-PAC 6 Clicks Basic Mobility score of 17 indicating 50.11% of functional impairment.  PT PLAN OF CARE  Rehab Potential:  Rehab Potential: Good Complexity/Co-morbidities that Impact POC: Cardiac compromise, Diabetes, Pulmonary compromise, Reduced activity level, Medications, Cognitive/Memory deficits, Neurologic Diagnosis Impairments/Limitations: Mobility deficits, Activity Tolerance Deficits, Decreased knowledge of condition, Ambulation Deficits, Transfer deficits, Balance Deficits, Bed mobility deficits, Safety Awareness deficits, Coordination/Proprioception Deficits  Plan:  Patient and Family Goals: to return to PLOF, home if able with assist per family pending insurance coverage for home health services including rehab and aide per dtr Treatment Plan/Goals Established with Patient/Caregiver: Yes Planned Treatment/Interventions: Patient/Caregiver education, Therapeutic activity, Self care home management, Therapeutic exercise, Gait/mobility training, Neuromuscular re-education PT Frequency: 4x week PT Duration: For 3 weeks  PT Goals:  Encounter Problems     Encounter Problems (Active)     Physical Therapy     Patient will perform all bed mobility with close supervision  to reduce risk of  pressure ulcers, improve safety with functional mobility, and reduce caregiver burden prior to discharge.      Start:  02/11/24    Expected End:  03/03/24             Patient will perform sit to/from stand transfers at bed, chair and commode with RW and close supervision to improve overall safety with functional mobility and decrease caregiver burden.      Start:  02/11/24    Expected End:  03/03/24             Patient will ambulate 150 feet with RW and close supervision to improve overall safety and independence for household functional mobility.       Start:  02/11/24    Expected End:  03/03/24         Patient will improve static and dynamic sitting balance to close supervision for sitting EOB x 10 minutes to allow functional activity and ADLs.       Start:  02/11/24    Expected End:  03/03/24         Patient will improve static and dynamic standing balance to close supervision with RW x 2 minutes to allow for functional activity and ADLs.      Start:  02/11/24    Expected End:  03/03/24         Caregiver will demonstrate ability to safely assist patient with bed mobility, transfers, and gait with RW.      Start:  02/11/24    Expected End:  03/03/24             Patient/caregiver will be able to verbalize fall prevention strategies to decrease risk of falls during functional mobility     Start:  02/11/24    Expected End:  03/03/24             TREATMENT TIME   Time In: 0936 Time Out: 0959 Time in Timed codes: 23 Total Treatment Time: 23     Treatment/Procedures Time Entry Gait/Mobility minutes: 23     Charges  02/22/2024   Code Description Service Provider Modifiers Quantity  [id number] Hc Pt Gait Training Therapy Bartley Leeroy Cullens, PT GP 2        Time of Service Note Type Status  None               "

## 2024-02-22 NOTE — Unmapped External Note (Signed)
" °   ° ° °  [place] Medicaid [facility] Form   Prior Approval   IDENTIFICATION  Patient Name: Jose Higgins Birthdate: [birth year]  SSN:  [id number]  Sex: male Admission Date (Current Location): 02/09/2024  [facility] and [place] Number:    Facility and Address:  [facility] [facility] [facility]  [facility] MEDICAL SURGICAL UNIT [address] [place] [address]  Provider Number: [id number]  Attending Physician Name:   Lamar Furnish, MD Address:  [address] [place] [address] Relative Name and Address:  Extended Emergency Contact Information Primary Emergency Contact: Alexander,Jennifer Mobile Phone: [phone] Relation: Daughter Secondary Emergency Contact: Moritz,Pauline Home Phone: [phone] Mobile Phone: [phone] Relation: Spouse  Current Level of Care: Hospital Recommended Level of Care: Skilled Nursing Facility Prior Approval Number:    Date Approved/Denied:   PASRR Number: [id number]  Discharge Plan: Skilled Nursing Facility    Current Diagnoses: Principal Problem (Resolved):   Altered mental status Active Problems:   Type 2 diabetes mellitus (CMD)   HTN (hypertension)   HLD (hyperlipidemia)   Intracranial hemorrhage due to trauma presenting with vasogenic edema 2/2 hyponatremia   IPH (idiopathic pulmonary hemosiderosis) (CMD)   Vasogenic brain edema    (CMD)   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)   A-fib    (CMD)   BPH (benign prostatic hyperplasia)   Dysphagia   Constipation Resolved Problems:   Hyponatremia   Metabolic encephalopathy   Cerebral contusion without loss of consciousness (CMD)   Hypophosphatemia   Urinary retention   Hyponatremia    DISORIENTED AMBULATORY STATUS BLADDER BOWEL  Constantly Semi-Ambulatory Continent Continent  INAPPROPRIATE BEHAVIOR FUNCTIONAL LIMITATIONS COMMUNICATION OF NEEDS RESPIRATION    Sight, Hearing  Verbally Normal  PERSONAL CARE ASSISTANCE ACTIVITIES/SOCIAL SKIN NUTRITION STATUS  Bathing, Feeding, Dressing Active, Family Supportive Normal Diet, Supplemental (Diet- Dysphagia; Thin liquids (no thickener added); Pureed foods (Level 4); Fluid Restricted; 1500 mL Fluid;   Ensure Plus Three times daily with Meals) Height: 1.778 m (5' 10) (02/13/2024 11:55 AM) Weight: 75.2 kg (165 lb 12.6 oz) (02/22/2024  5:36 AM)  PHYSICIAN VISITS NEUROLOGICAL    30 days          SPECIAL CARE FACTORS FREQUENCY  PT (By Licensed PT), Speech Therapy, Occupational Therapy                      MEDICATIONS: Scheduled Meds:amLODIPine, 5 mg, oral, Daily atorvastatin , 20 mg, oral, Nightly insulin  aspart (NovoLOG )/insulin  lispro (HumaLOG) injection (WF), 0-12 Units, subcutaneous, TID PC metoprolol  tartrate, 25 mg, oral, Q12H SCH potassium chloride , 40 mEq, oral, Once QUEtiapine, 50 mg, oral, At Bedtime tamsulosin, 0.4 mg, oral, Daily   Continuous Infusions:  PRN Meds:.  acetaminophen    albuterol    dextrose    dextrose    hydrALAZINE   labetalol   ondansetron  Do not use this list as official medication orders. Please verify with discharge summary.  X-ray and Laboratory Findings/Date: See Clinical Information for details     ADDITIONAL INFORMATION:    Rosina CHRISTELLA Sharps, CCA  "

## 2024-02-22 NOTE — Progress Notes (Signed)
 Acute Inpatient Rehab Consult Request  Appropriateness: Patient determined to be appropriate candidate for acute rehab hospitalization Details: [facility] has received referral after family was asking for [facility] to evaluate since patient is showing improvement. It is our understanding pt is going to [facility] today but family wants us  to try to get approval for inpatient rehab and would want to bring him back here if he gets approved. We have submitted an authorization request to [employer]. Acute Rehab Liaison Contact Info: Arlean Rio, RN [phone] Dorothe Jim, Program Manager Inpatient Rehabilitation [phone]

## 2024-02-22 NOTE — Care Plan (Signed)
" °  Problem: Health Behavior: Goal: MCB Ability to state ways to decrease the risk of falls will be met by discharge Description: Ability to state ways to decrease the risk of falls will improve by discharge Outcome: Progressing   Problem: Safety: Goal: Will remain free from falls by discharge Description: Will remain free from falls by discharge Outcome: Progressing   Problem: Knowledge Deficit Goal: Patient/family/caregiver demonstrates understanding of disease process, treatment plan, medications, and discharge instructions Description: Complete learning assessment and assess knowledge base. Outcome: Progressing   Problem: Compromised Skin Integrity Goal: Skin integrity is maintained or improved Description: Assess and monitor skin integrity. Identify patients at risk for skin breakdown on admission and per policy. Collaborate with interdisciplinary team and initiate plans and interventions as needed.    Outcome: Progressing Goal: Fluid and electrolyte balance are achieved/maintained Description: Assess and monitor vital signs (orthostatic vitals if applicable), fluid intake and output, urine color, labs, skin turgor, mucous membranes, jugular venous distention, edema, circumference of edematous extremities and abdominal girth, respiratory status, and mental status.  Monitor for signs and symptoms of hypovolemia (tachycardia, rapid breathing, decreased urine output, postural hypotension, confusion, syncope).  Monitor for signs and symptoms of hypervolemia (strong rapid pulse, shortness of breath, difficulty breathing lying down, crackles heard in lung fields, edema). Collaborate with interdisciplinary team and initiate plan and interventions as ordered. Outcome: Progressing Goal: Nutritional status is improving Description: Monitor and assess patient for malnutrition (ex- brittle hair, bruises, dry skin, pale skin and conjunctiva, muscle wasting, smooth red tongue, and disorientation).  Collaborate with interdisciplinary team and initiate plan and interventions as ordered.  Monitor patient's weight and dietary intake as ordered or per policy. Utilize nutrition screening tool and intervene per policy. Determine patient's food preferences and provide high-protein, high-caloric foods as appropriate.  Outcome: Progressing   Problem: Urinary Incontinence Goal: Perineal skin integrity is maintained or improved Description: Assess genitourinary system, perineal skin, labs (urinalysis), and history of incontinence to include past management, aggravating, and alleviating factors.  Collaborate with interdisciplinary team and initiate plans and interventions as needed. Outcome: Progressing   Problem: SAFETY - MEDICAL RESTRAINT Goal: Remains free of injury from restraints (Restraint for Interference with Medical Device) Description: INTERVENTIONS: 1. Determine that other, less restrictive measures have been tried or would not be effective before applying the restraint 2. Evaluate the patient's condition at the time of restraint application 3. Inform patient/family regarding the reason for restraint 4. Q2H: Monitor safety, psychosocial status, comfort, nutrition and hydration Outcome: Progressing Goal: Free from restraint(s) (Restraint for Interference with Medical Device) Description: INTERVENTIONS: 1. ONCE/SHIFT or MINIMUM Q12H: Assess and document the continuing need for restraints 2. Q24H: Continued use of restraint requires LIP to perform face to face examination and written order 3. Identify and implement measures to help patient regain control Outcome: Progressing   Problem: Activity: Goal: Ability to ambulate will improve Description: Ability to ambulate will improve Outcome: Progressing   Problem: Self-Care: Goal: Ability to perform activities of daily living will improve Description: Ability to perform activities of daily living will improve Outcome: Progressing   "

## 2024-02-22 NOTE — Progress Notes (Signed)
 [employer] Acute, SNF, LTAC, IRF Authorization Request  Patient's Current Location (If Facility, name of Facility is Needed): Acute Hospital   Request for: Acute inpatient rehab Today's Date: 02/22/2024   Patient's Name: Jose Higgins  Member ID: [id number] - ([employer])  Patient's DOB: 06-04-1940   Patient PCP: Ryan JONETTA Hives, MD NPI: [id number]  CM/SW Name: Arlean SHAUNNA Rio   CM/SW Phone: [phone] FAX: [phone]   Expected Admit Date: 02/23/24   Ordering Physician: Lynwood Shuck, MD Ordering Physician NPI: [id number]   Facility: [facility] acute inpatient rehab Facility NPI: [id number]   Treating Physician:   Lynwood Shuck MD Treating Physician NPI:   [id number]   ICD-10 CM Diagnosis Description / ICD-10 CM Code S06.5X0A (subdural hematoma) R13.10 dysphagia  Problem List[1]   Describe any special circumstances which should be considered when authorizing services: Bilateral subdural hematomas/brain injury. Needs PT/OT/ST 3 hours per day for 5 days a week 14 days.  Patient's Current Location: Acute Request For: SNF CM/SW Phone: Henretta Orchard [phone] CM/SW Fax: [phone] Expected Admit Date: 02/14/24 Ordering Provider: Aleene Sensor Ordering Provider NPI: [id number] Facility (Accepting Facility): [facility] Address (Accepting Facility): [address], [place], [place] [address] Provider Number (Accepting Facility): [id number]    * To view additional clinical information, please log into [facility] *  This request will be treated as per the standard organization determination timeframes. If the request needs to be treated as expedited, clinical justification must be provided that applying the standard time for making a determination could seriously jeopardize the life or health of the member or the members ability to regain maximum function.         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

## 2024-02-22 NOTE — Progress Notes (Signed)
 Date: 02/22/2024        Room: 779/01 Hospital Day: 13 Attending Physician: Lamar Furnish, MD  Subjective: Patient seen and examined at the bedside. He is sleeping and not in acute distress. Denied any chest pain, SOB,headache, nausea or vomiting  Appetite improving . Sometimes Normal bowel habits and bladder movements  No acute events overnight     Hospital Course : Jose Higgins is a 84 y.o. male with a PMH significant for Traumatic intracranial hemorrhages: bilateral frontal contusions, left  temporal contusion, bilateral SDH, IVH, nondisplaced right mastoid temporal bone fracture (from 02/01/24 fall) presented to ER for  acute altered mental status due to TBI + delirium, worsened by hyponatremia (Na 128 corrected to 144). Agitation requiring Precedex, restraints, sitter, Haldol, olanzapine, and Seroquel. Persistent urinary retention requiring Foley. Comorbidities: A-fib (previously on Eliquis ), HTN, T2DM, HLD, BPH. 1/13: Readmitted with worsening confusion, headache, lethargy. CT: size of left frontal IPH (2.4 -- 2.3 cm) with worsening edema and 3 to 5 mm midline shift. ICU admission. Started 3% saline.  Precedex for agitation. 1/14: Na 134; stable CT; remained agitated. 1/15: Na normalized to 144. Transferred out of ICU. Foley for retention; PT/OT recommended post acute rehab. 1/16 to 1/17: Persistent agitation; Foley removed then replaced for retention >600cc. Norvasc started. 1/18: MRI benign; Neurology: AMS = TBI + delirium; recommended Seroquel  Depakote. 1:1 sitter; mittens; Haldol/valium PRN. 1/19: Mental status improved but still confused. SLP: pureed diet. NSG: conservative management; long term discontinuation of anticoagulation recommended. 1/20: Mentation improving. Decreased appetite .  SLP recommending thin pureed diet  1/21: More sleepy today.  Received Haldol for agitation.  Repeat CT head shows No new bleeding,No worsening swelling and No  Hydrocephalus.  1/22: Drowsy  after haldol. Na is 134 . U Osm.S Osm and U Na pending . 1/23: Cleared by Neurology and Neuro surgery for discharge . Off Haldol and Foleys.  Pending placement  1/24: Medically cleared for discharge pending placement. 1/25: Medically cleared for discharge pending placement. 1/26: Medically cleared for discharge pending placement.  Assessment & Plan Intracranial hemorrhage due to trauma presenting with vasogenic edema 2/2 hyponatremia Per NSG who has provided a great summary, presented initially 02/01/2024 after ground-level fall in the bathroom when he had multiple episodes of diarrhea and vomiting. Initially evaluated at [facility] with CT head scan that revealed sizable left frontal and left temporal lobe contusions and small right frontal contusion with bilateral subdural hematomas, 6 mm right and 4 mm left with small volume intraventricular hemorrhage associated with a nondisplaced right mastoid temporal bone fracture extending along the occipital mastoid suture line into the right occipital and parietal bone. GCS initially 14 and vital signs stable on room air. He was given Kcentra. Given that his hemorrhages had occurred on anticoagulation, he was transferred to [facility] for management. He received 1 week of Keppra. Serial CT head scans were stable and he was discharged to [facility] for rehab 02/06/2024. While hospitalized, he was seen by plastic and reconstructive surgery who did not recommend any immediate surgical intervention. He presented to [facility] 02/09/2024 after worsening headaches and decreased appetite. Repeat CT head scan revealed increased size of the left frontal lobe IPH with worsening vasogenic edema with no change in the left temporal lobe hemorrhage and no change in the right frontal contusion. There was now 3 mm left-to-right shift. In addition, his serum sodium was 128 on admission. He was admitted to the ICU for treatment with 3% saline to correct his sodium  to 145-150 range. On January  14, he was requiring Precedex for agitation. This has been weaned off since and patient transferred out of the ICU on 1/16. -In terms of AMS, no pneumonia on CXR, no signs of UTI on UA. No fever or leukocytosis to warrant checking a Bcx. TSH nl. Cortisol though checked at midnight on 1/13 was 17.1 (though not above 18 since it is before the AM spike, low concern for adrenal insufficiency).  -MRI brain benign.  Neurology thinks this is a combination of TBI and delirium. PLAN - Repeat CT head on 02/17/24:No new bleeding,No worsening swelling and No hydrocephalus - Continue to hold anticoagulation/antiplatelets and strongly recommend long-term discontinuation of Eliquis  given his falls and gait unsteadiness. -Per neurology informal consult, pt far enough out from original TBI, but continues to have cerebral edema without midline shift on CT 1/21. Keep Na >135.  - SBP goal <160 mmHg - No spine precautions - Follow-up with Dr. Scharlene 2-3 weeks with CT head scan obtained prior to follow-up. - PT/OT - Pending placement to SNF  A-fib    (CMD) HTN (hypertension) At home, takes metoprolol  succinate 50 mg daily.  Previously on Eliquis .  - Holding off on [facility] given IPH. Likely DC of eliquis  long term.  - Continue metoprolol  to tartrate 25 mg twice daily.  - Per neurosurgery, goal SBP < 160. PRN labetalol for SBP > 160.   - Keep K > 4 and Mg > 2 - Started norvasc on 1/16 Type 2 diabetes mellitus (CMD) Last a1c results: 7.0 on 02/09/2024 - FSBS ACHS, SSI - Consistent carb diet - Hypoglycemia protocol  - Home Jardiance-metformin held HLD (hyperlipidemia) - Continue home statin BPH (benign prostatic hyperplasia) Foley placed in the ICU. Foley dc'd on 1/16. Patient has had to be in/out cath'd twice so far (retaining >600cc). Later, foley had to be placed on 1/17. Plan -Flomax - Foley removed on 02/19/24  I/O last 3 completed shifts: In: 580 [P.O.:580] Out: 1263 [Urine:1263] I/O this shift: In:  500 [P.O.:500] Out: -    Constipation -Possibly 2/2 medications and increased PO intake -Abdomen soft, non-tender -Miralax   Dysphagia SLP evaluated him on 1/19 and recommended soft and bite sized food with thin liquids except when talking to daughter they preferred trying pureed food for now given their concern that patient pockets food in mouth. Pureed diet ordered.  -MBS on 1/20 recommending to consider alternative nutrition short-term, with continuiance of pureed/thin diet as patient is at significant risk for aspiration -After a shared decision-making discussion involving the care team and the family, we collectively agreed that proceeding with PEG tube is not appropriate given the risk of inadvertent line removal and the family's expressed concerns. -Aspiration precautions   The primary contact is:  Extended Emergency Contact Information Primary Emergency Contact: Alexander,Jennifer Mobile Phone: [phone] Relation: Daughter Secondary Emergency Contact: Troeger,Pauline Home Phone: [phone] Mobile Phone: [phone] Relation: Spouse  Code Status: Full Code   DVT ppx: Sequential Compression Devices  Discharge Planning:  The current disposition plan is discharge to SNF in 1-2 days.  Scheduled Medications: amLODIPine, 5 mg, oral, Daily atorvastatin , 20 mg, oral, Nightly insulin  aspart (NovoLOG )/insulin  lispro (HumaLOG) injection (WF), 0-12 Units, subcutaneous, TID PC metoprolol  tartrate, 25 mg, oral, Q12H SCH potassium chloride , 40 mEq, oral, Once QUEtiapine, 50 mg, oral, At Bedtime tamsulosin, 0.4 mg, oral, Daily    Prn Medications:   acetaminophen    albuterol    dextrose    dextrose    hydrALAZINE   labetalol  ondansetron   Continuous Medications:       Dietary Orders  (From admission, onward)               Ordered    Adult Diet- Dysphagia; Thin liquids (no thickener added); Pureed foods (Level 4); Fluid Restricted; 1500 mL Fluid  Diet  effective now       References:    Medical Nutrition Management (MNM) for Registered Dietitian  Question Answer Comment  Diet type: Dysphagia   Fluid Modification: Thin liquids (no thickener added)   Food Modification: Pureed foods (Level 4)   Fluid Restriction: Fluid Restricted   Fluid restriction dietary / 24h: 1500 mL Fluid   Medical Nutrition Management By RD: Yes, Medical Nutrition Management By RD      02/18/24 9096    Ensure Plus; Three times daily with Meals  Once       Question Answer Comment  [facility]: Ensure Plus   Frequency: Three times daily with Meals   Medical Nutrition Management By RD: Yes, Medical Nutrition Management By RD      02/11/24 1239            Objective: Temp:  [97.3 F (36.3 C)] 97.3 F (36.3 C) Heart Rate:  [85-119] 101 Resp:  [17-18] 17 BP: (90-106)/(54-77) 97/67  Intake/Output Summary (Last 24 hours) at 02/22/2024 1422 Last data filed at 02/22/2024 1200 Gross per 24 hour  Intake 980 ml  Output 650 ml  Net 330 ml    Physical Exam:  General:  NAD   Respiratory: CTA, no rales, rhonchi, or wheezes  Cardiovascular: Normal S1, S2, no murmur, rub, or gallop  Gastrointestinal: Abdomen soft, nontender  Skin: Dry, intact  Neuro: Awake and alert        Patient Lines/Drains/Airways Status     Active Peripherally inserted central catheter / Central venous catheter / Peripheral intravenous line / Drain / Airway / Intraosseous line / Epidural catheter / Arterial line / Line / Nasogastric/Orogastric Tube / Hemodialysis catheter / Umbilical venous catheter / Urethral Catheter / Intrauterine Pressure Catheter / Implantable Port / NHSN Urethral Catheter / NHSN Umbilical Catheter / Intentionally Retained Foreign Objects / AV Fistula / Peripheral Nerve Catheter     Name Placement date Placement time Site Days   Peripheral IV 02/10/24 16 G Distal;Left;Lateral Arm 02/10/24  1003  Arm  12           Lab Results  Component Value Date   WBC 9.50  02/19/2024   HGB 15.4 02/19/2024   HCT 44.8 02/19/2024   MCV 88.8 02/19/2024   PLT 279 02/19/2024   Lab Results  Component Value Date   GLUCOSE 173 (H) 02/19/2024   CALCIUM  9.5 02/19/2024   NA 135 (L) 02/19/2024   K 4.1 02/19/2024   CO2 25 02/19/2024   CL 101 02/19/2024   BUN 18 02/19/2024   CREATININE 0.94 02/19/2024   Lab Results  Component Value Date   ALT 17 02/09/2024   AST 15 02/09/2024   GGT 11 02/01/2024   BILITOT 1.8 (H) 02/09/2024   Lab Results  Component Value Date   INR 1.3 02/09/2024   INR 1.0 02/01/2024   INR 1.3 02/01/2024   PROTIME 16.0 (H) 02/09/2024   PROTIME 11.4 02/01/2024   PROTIME 16.5 (H) 02/01/2024   No results found for this visit on 02/09/24 (from the past week).  Electronically signed by: Lamar Furnish, MD 02/22/2024 2:22 PM

## 2024-02-23 NOTE — Care Plan (Signed)
" °  Problem: Safety: Goal: Will remain free from falls by discharge Description: Will remain free from falls by discharge Outcome: Progressing   Problem: Compromised Skin Integrity Goal: Skin integrity is maintained or improved Description: Assess and monitor skin integrity. Identify patients at risk for skin breakdown on admission and per policy. Collaborate with interdisciplinary team and initiate plans and interventions as needed.    Outcome: Progressing   Problem: Activity: Goal: Ability to ambulate will improve Description: Ability to ambulate will improve Outcome: Progressing   Problem: Self-Care: Goal: Ability to perform activities of daily living will improve Description: Ability to perform activities of daily living will improve Outcome: Progressing   "

## 2024-02-23 NOTE — Progress Notes (Signed)
 Acute Rehab Consult Request  Details: Pt has been approved for inpatient rehab by [employer]. Hoping patient will be able to admit  on 02/24/24. Dr. Braxton plans to keep him overnight due to unwitnessed fall today. Acute Rehab Liaison Contact Info: Arlean Rio, Rn [phone]

## 2024-02-23 NOTE — Progress Notes (Signed)
 "                                             Hospitalist Daily Progress Note     Date: 02/23/2024        Room: 779/01 Hospital Day: 14 Attending Physician: Ulyses Greener, MD  Subjective / Interval History / ROS  No new issues reported.   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 Initially evaluated at [facility] with CT head scan that revealed sizable left frontal and left temporal lobe contusions and small right frontal contusion with bilateral subdural hematomas, 6 mm right and 4 mm left with small volume intraventricular hemorrhage associated with a nondisplaced right mastoid temporal bone fracture extending along the occipital mastoid suture line into the right occipital and parietal bone. GCS initially 14 and vital signs stable on room air. He was given Kcentra. Given that his hemorrhages had occurred on anticoagulation, he was transferred to [facility] for management. He received 1 week of Keppra. Serial CT head scans were stable and [facility] was [facility] 02/06/2024. While  hospitalized, he was seen by plastic and reconstructive surgery who did not recommend any immediate surgical intervention. He presented to [facility] 02/09/2024 after worsening headaches and decreased appetite. Repeat CT head scan revealed increased size of the left frontal lobe IPH with worsening vasogenic edema with no change in the left temporal lobe hemorrhage and no change in the right frontal contusion. There was now 3 mm left-to-right shift. In addition, his serum sodium was 128 on admission. He was admitted to the ICU for treatment with 3% saline to correct his sodium to 145-150 range. On January 14, he was requiring Precedex for agitation. This has been weaned off since and patient transferred out of the ICU on 1/16. -In terms of AMS, no pneumonia on CXR, no signs of UTI on UA. No fever or leukocytosis to warrant checking a Bcx. TSH nl. Cortisol though checked at midnight on 1/13 was 17.1 (though not above 18 since it is before the AM spike, low concern for adrenal insufficiency).  -MRI brain benign.  Neurology thinks this is a combination of TBI and delirium. PLAN - Repeat CT head on 02/17/24:No new bleeding,No worsening swelling and No hydrocephalus - Continue to hold anticoagulation/antiplatelets and strongly recommend long-term discontinuation of Eliquis  given his falls and gait unsteadiness. -Per neurology informal consult, pt far enough out from original TBI, but continues to have cerebral edema without midline shift on CT 1/21.  Keep Na >135.  - SBP goal <160 mmHg - No spine precautions - Follow-up with Dr. Scharlene 2-3 weeks with CT head scan obtained prior to follow-up. - PT/OT - Pending placement to SNF/IRC   A-fib    (CMD) HTN (hypertension) At home, takes metoprolol  succinate 50 mg daily.  Previously on Eliquis .  - Holding off on [facility] given IPH. Likely DC of eliquis  long term.  - Continue metoprolol  to tartrate 25 mg twice daily.  - Per neurosurgery, goal SBP < 160. PRN labetalol for  SBP > 160.   - Keep K > 4 and Mg > 2 - Blood pressure soft.  Discontinue amlodipine.  Continue metoprolol .  Monitor blood pressure closely. Type 2 diabetes mellitus (CMD) Last a1c results: 7.0 on 02/09/2024 - FSBS ACHS, SSI - Consistent carb diet - Hypoglycemia protocol  - Home Jardiance-metformin held HLD (hyperlipidemia) - Continue home statin BPH (benign prostatic hyperplasia) Foley placed in the ICU. Foley dc'd on 1/16. Patient has had to be in/out cath'd twice so far (retaining >600cc). Later, foley had to be placed on 1/17. Plan -Flomax - Foley removed on 02/19/24 -Voiding successfully. Constipation -Possibly 2/2 medications and increased PO intake -Abdomen soft, non-tender -Miralax    Dysphagia SLP evaluated him on 1/19 and recommended soft and bite sized food with thin liquids except when talking to daughter they preferred trying pureed food for now given their concern that patient pockets food in mouth. Pureed diet ordered.  -MBS on 1/20 recommending to consider alternative nutrition short-term, with continuiance of pureed/thin diet as patient is at significant risk for aspiration -After a shared decision-making discussion involving the care team and the family, we collectively agreed that proceeding with PEG tube is not appropriate given the risk of inadvertent line removal and the family's expressed concerns. -Aspiration precautions   The primary contact is:  Extended Emergency Contact Information Primary Emergency Contact: Morgan,Delynn Mobile Phone: [phone] Relation: Daughter Secondary Emergency Contact: Alexander,Jennifer Mobile Phone: [phone] Relation: Daughter   Code Status: Full Code Code status last reviewed with patient and/or updated 02/23/2024   DVT ppx: Sequential Compression Devices  Current Diet:     Dietary Orders  (From admission, onward)               Ordered    Adult Diet- Dysphagia; Thin liquids (no thickener added); Pureed foods  (Level 4); Fluid Restricted; 1500 mL Fluid  Diet effective now       References:    Medical Nutrition Management (MNM) for Registered Dietitian  Question Answer Comment  Diet type: Dysphagia   Fluid Modification: Thin liquids (no thickener added)   Food Modification: Pureed foods (Level 4)   Fluid Restriction: Fluid Restricted   Fluid restriction dietary / 24h: 1500 mL Fluid   Medical Nutrition Management By RD: Yes, Medical Nutrition Management By RD      02/18/24 9096    Ensure Plus; Three times daily with Meals  Once       Question Answer Comment  [facility]: Ensure Plus   Frequency: Three times daily with Meals   Medical Nutrition Management By RD: Yes, Medical Nutrition Management By RD      02/11/24 1239             Discharge Planning  The current disposition plan is discharge to SNF/IRC in 24 hours. Barriers to discharge: pending medical stabilization Discharge follow up needed:     Objective   Temp:  [97.3 F (36.3 C)-97.5 F (36.4 C)] 97.4 F (36.3  C) Heart Rate:  [87-112] 107 Resp:  [16-18] 16 BP: (92-130)/(61-69) 130/69   Intake/Output Summary (Last 24 hours) at 02/23/2024 1423 Last data filed at 02/23/2024 1328 Gross per 24 hour  Intake 830 ml  Output 220 ml  Net 610 ml      Physical Examination: GEN: NAD, lying in bed EYES: EOMI ENT: MMM CV: RRR PULM: Fair bilateral air entry noted ABD: Soft, non-distended, non-tender, normal bowel sounds Psychiatric exam.  Patient has appropriate behavior.    Labs and Results  I have reviewed the following labs and studies Lab Results (last 24 hours)     Procedure Component Value Ref Range Date/Time   POC Glucose [[id number]]  (Abnormal) Collected: 02/23/24 1058   Lab Status: Final result Specimen: Blood from Capillary Updated: 02/23/24 1059    Glucose, POC 182* 70 - 99 mg/dL     Comment: Pre-Meal     Narrative:     Testing Personnel: Malath Nyidhuor Lot Number: [id number] Instrument Serial Number:  [id number] Testing performed using [employer] StatStrip   POC Glucose [[id number]]  (Abnormal) Collected: 02/23/24 0727   Lab Status: Final result Specimen: Blood from Capillary Updated: 02/23/24 0728    Glucose, POC 145* 70 - 99 mg/dL     Comment: Pre-Meal     Narrative:     Testing Personnel: Malath Nyidhuor Lot Number: [id number] Instrument Serial Number: [id number] Testing performed using [employer] StatStrip   POC Glucose [[id number]]  (Abnormal) Collected: 02/22/24 1956   Lab Status: Final result Specimen: Blood from Capillary Updated: 02/22/24 1957    Glucose, POC 135* 70 - 99 mg/dL     Comment: Post-Meal     Narrative:     Testing Personnel: Darby Forward Lot Number: [id number] Instrument Serial Number: [id number] Testing performed using [employer] StatStrip   POC Glucose [[id number]]  (Abnormal) Collected: 02/22/24 1557   Lab Status: Final result Specimen: Blood from Capillary Updated: 02/22/24 1558    Glucose, POC 150* 70 - 99 mg/dL     Comment: Pre-Meal     Narrative:     Testing Personnel: Talbot Bouchard Lot Number: [id number] Instrument Serial Number: [id number] Testing performed using [employer] StatStrip      No results found for this visit on 02/09/24 (from the past week).    Medications   [Held by provider] amLODIPine, 5 mg, oral, Daily atorvastatin , 20 mg, oral, Nightly [Held by provider] enoxaparin , 40 mg, subcutaneous, At Bedtime insulin  aspart (NovoLOG )/insulin  lispro (HumaLOG) injection (WF), 0-12 Units, subcutaneous, TID PC metoprolol  tartrate, , ,  metoprolol  tartrate, 12.5 mg, oral, Q12H SCH potassium chloride , 40 mEq, oral, Once QUEtiapine, 50 mg, oral, At Bedtime tamsulosin, 0.4 mg, oral, Daily     acetaminophen    albuterol    dextrose    dextrose    hydrALAZINE   labetalol   metoprolol  tartrate   ondansetron     Electronically signed by: Ulyses Greener, MD 02/23/2024 2:23 PM      *Some images could not be shown."

## 2024-02-23 NOTE — Progress Notes (Addendum)
 Case Management Update  Date: 02/23/2024   Time: 2:55 PM   Patient Type: Inpatient PT is being reviewed by [employer] for Inpatient Rehab vs SNF.  PT's daughter Jose Higgins [phone] called this writer requesting IPR referral before SNF referral.  CM called and spoke to Jose Higgins UR RN with [employer] [phone] (main number [phone]) who has escalated case to medical director for review.  CM spoke to [employer] at [facility] [phone] who can accept pt back ad pt does have grace days available awaiting SNF auth pending denial from IPR.  PT fell in his room on this date due to not waiting on nursing to assist him with out of bed mobility.  Dr Braxton will watch pt overnight due to fall.  Jenkins Rio with IPR and Joshua Dillon at [facility] are both aware.    3:27 pm Pt was approved for IPR per Jenkins Rio Will continue to follow.     Post Acute Placement Status: Referral sent and pending status update from locations.   Case Management Coordination Status: Coordination In-Progress    Anticipated Discharge Location: Inpatient Rehabilitation Facility  If Plan A discharging location is not feasible: Potential Plan B: Skilled Nursing Facility - short-term rehab         Jose Higgins, MSW

## 2024-02-24 NOTE — Progress Notes (Signed)
" °   02/24/24 0957  Reason Eval/Treat Not Completed  Reason Eval/Treat Not Completed Provider request hold;Not medically appropriate   Patient with change in mentation. Per MD, patient is going for a Stat CT head and stat labs. Will hold at this time until medially cleared.  Will continue efforts at a later date. "

## 2024-02-24 NOTE — Progress Notes (Addendum)
 Case Management Update  Date: 02/24/2024   Time: 9:04 AM   Patient Type: Inpatient Case received in transfer . Plan is for patient to go to [facility] likely today . Has insurance prior auth . Addendum : MD related that plan is [facility] tomorrow . Addendum : MD related that plan is [facility] today .    Post Acute Placement Status: Review currently in progress.  Case Management Coordination Status: Coordination In-Progress    Anticipated Discharge Location: Inpatient Rehabilitation Facility  If Plan A discharging location is not feasible: Potential Plan B: To be determined         Helayne DELENA Ever Douglas, MSW

## 2024-02-24 NOTE — Nursing Note (Signed)
 Report called to [facility] with [facility].

## 2024-02-24 NOTE — Care Plan (Signed)
 " Problem: Health Behavior: Goal: MCB Ability to state ways to decrease the risk of falls will be met by discharge Description: Ability to state ways to decrease the risk of falls will improve by discharge Outcome: Progressing   Problem: Safety: Goal: Will remain free from falls by discharge Description: Will remain free from falls by discharge Outcome: Progressing   Problem: Knowledge Deficit Goal: Patient/family/caregiver demonstrates understanding of disease process, treatment plan, medications, and discharge instructions Description: Complete learning assessment and assess knowledge base. Outcome: Progressing   Problem: Compromised Skin Integrity Goal: Skin integrity is maintained or improved Description: Assess and monitor skin integrity. Identify patients at risk for skin breakdown on admission and per policy. Collaborate with interdisciplinary team and initiate plans and interventions as needed.    Outcome: Progressing Goal: Fluid and electrolyte balance are achieved/maintained Description: Assess and monitor vital signs (orthostatic vitals if applicable), fluid intake and output, urine color, labs, skin turgor, mucous membranes, jugular venous distention, edema, circumference of edematous extremities and abdominal girth, respiratory status, and mental status.  Monitor for signs and symptoms of hypovolemia (tachycardia, rapid breathing, decreased urine output, postural hypotension, confusion, syncope).  Monitor for signs and symptoms of hypervolemia (strong rapid pulse, shortness of breath, difficulty breathing lying down, crackles heard in lung fields, edema). Collaborate with interdisciplinary team and initiate plan and interventions as ordered. Outcome: Progressing Goal: Nutritional status is improving Description: Monitor and assess patient for malnutrition (ex- brittle hair, bruises, dry skin, pale skin and conjunctiva, muscle wasting, smooth red tongue, and disorientation).  Collaborate with interdisciplinary team and initiate plan and interventions as ordered.  Monitor patient's weight and dietary intake as ordered or per policy. Utilize nutrition screening tool and intervene per policy. Determine patient's food preferences and provide high-protein, high-caloric foods as appropriate.  Outcome: Progressing   Problem: Urinary Incontinence Goal: Perineal skin integrity is maintained or improved Description: Assess genitourinary system, perineal skin, labs (urinalysis), and history of incontinence to include past management, aggravating, and alleviating factors.  Collaborate with interdisciplinary team and initiate plans and interventions as needed. Outcome: Progressing   Problem: SAFETY - MEDICAL RESTRAINT Goal: Remains free of injury from restraints (Restraint for Interference with Medical Device) Description: INTERVENTIONS: 1. Determine that other, less restrictive measures have been tried or would not be effective before applying the restraint 2. Evaluate the patient's condition at the time of restraint application 3. Inform patient/family regarding the reason for restraint 4. Q2H: Monitor safety, psychosocial status, comfort, nutrition and hydration Outcome: Progressing Goal: Free from restraint(s) (Restraint for Interference with Medical Device) Description: INTERVENTIONS: 1. ONCE/SHIFT or MINIMUM Q12H: Assess and document the continuing need for restraints 2. Q24H: Continued use of restraint requires LIP to perform face to face examination and written order 3. Identify and implement measures to help patient regain control Outcome: Progressing   Problem: Activity: Goal: Ability to ambulate will improve Description: Ability to ambulate will improve Outcome: Progressing   Problem: Self-Care: Goal: Ability to perform activities of daily living will improve Description: Ability to perform activities of daily living will improve Outcome: Progressing    Problem: Activity: Goal: Participation in diversional activities to the maximum extent possible will be supported - LTG Description: Participation in diversional activities to the maximum extent possible will be supported Outcome: Progressing   Problem: Health Behavior: Goal: Knowledge of disease or condition will improve by discharge Outcome: Progressing Goal: Understanding of proper healthcare maintenance will be met by discharge Description: Understanding of proper healthcare maintenance will be met by discharge  Outcome: Progressing Goal: Verbalization of signs and symptoms of infection will improve by discharge Outcome: Progressing   Problem: Risk for Decreased Mobility Goal: Patient's mobility will be maintained/improved during hospital stay Outcome: Progressing Goal: Mobility Care consults utilized appropriately Outcome: Progressing Goal: Patient mobilizes safely throughout hospitalization Outcome: Progressing   Problem: Coping: Goal: Ability to verbalize feelings will improve by discharge Outcome: Progressing Goal: Ability to verbalize ways to enhance comfort will improve by discharge Outcome: Progressing   Problem: Fluid Volume: Goal: Ability to maintain a balanced intake and output will improve by discharge Description: Ability to maintain a balanced intake and output will improve by discharge Outcome: Progressing   Problem: Health Behavior Goal: Understanding of treatment plan will improve by discharge Outcome: Progressing Goal: Compliance with treatment plan for underlying cause of condition will improve by discharge Outcome: Progressing Goal: Identification of resources available to assist in meeting health care needs will improve by discharge Outcome: Progressing Goal: Ability to care for self will improve by discharge Outcome: Progressing   Problem: Urinary Elimination: Goal: Ability to recognize the need to void and respond appropriately will improve by  discharge Outcome: Progressing Goal: Ability to completely empty bladder with each voiding will improve by discharge Outcome: Progressing Goal: Will remain free from infection by discharge Outcome: Progressing   "

## 2024-02-24 NOTE — Progress Notes (Addendum)
 SPEECH THERAPY TREATMENT NOTE  SLP Treatment Assessment Summary Todays speech therapy treatment focused on assessment of diet tolerance and ability to upgrade diet. The patient has demonstrated steady progress as evidenced by ability to adequately orally tolerate thin and puree efficiently and without overt s/sx of aspiration. One cough noted t/o all po trials which did not appear to be r/t swallowing. MBS results reviewed and although study was limited, there was no penetration or aspiration. Additionally, per RN, patient's lungs are CTA. Patient was given a simulated soft/bite sized bolus which resulted in slightly prolonged mastication and oral transit with scant oral residue which cleared with a liquid wash. Oral dysphagia appears to be cognitive based as patient is distracted and talkative despite cues. Recommend upgrade to minced/moist with supervision; assistance PRN. At this time, the patient presents with Oral Dysphagia. services: Patient will benefit from continued skilled speech therapy services to address stated impairments.  RECOMMENDATIONS Recommendations: Oral Intake, Routine oral care with nursing Solid Recommendations:  Minced and Moist Liquid Recommendations:  Thin Medication Recommendations: Recommended Form of Medications: Crushed, With puree  Recommendations ST Recommendations after Discharge: Cognitive-Linguistic Therapy, Dysphagia Therapy (will need cogitive evalution)      Medical Diagnosis/Course:  SLP Diagnosis/Course SLP Medical Dx: Dysphagia SLP Pertinent Medical Course: fall on Jan 5 with left frontal and temporal IPH; returned 1/13 due to increased lethargy and headache, which was worse with cough; CT now with increased size of IPH and worsening vasogenic edema.  SAFE FEEDING STRATEGIES Allow extra time to swallow, Alternate solids and liquids, Check for pocketing, Eat/feed slowly, Fed only by trained staff/family, Feed only when alert, Full supervision with meals,  Remain upright 20-30 minutes following po intake, Set up with meals, Small bites/sips, Upright as possible for all oral intake, Swallow 2 times per bite/sip, Verbal cues  COMMUNICATION Communication: Family/Caregiver, Nursing Communication Details: plan to upgrade to minced/moist  SUBJECTIVE: Patient is alert and confused; speech is likely aphasic  BASELINE ASSESSMENT O2 Device: None (Room air)     Dentition: Some missing teeth Diet Prior to Admission: Liquid-Thin, Solid-Regular Current Method of Nutrition: Oral intake     ORAL/MOTOR Oral/Motor Vocal Intensity: Mildly decreased  CONSISTENCIES ASSESSED: PO Trials  Consistencies Assessed: Yes Thin Thin - Presentation: Straw Thin - Oral: Within Functional Limits Thin - Pharyngeal: No overt signs of aspiration Puree Puree - Presentation: Spoon Puree - Oral: Within Functional Limits Puree - Pharyngeal: No overt signs of aspiration Soft & Bite Sized Soft & Bite Sized - Presentation: Spoon Soft & Bite Sized - Oral: Prolonged mastication, Oral residue (due to increased distractibility and talking) Soft & Bite Sized - Pharyngeal: No overt signs of aspiration  SLP-Response to Treatment: Tolerating advancing diet  SLP-Skill Performed by Clinician: Monitoring for safe progression of exercise/activity Monitoring for exercise/activity tolerance  PLAN Plan Patient and Family Goals: for patient to upgrade diet, also get adequate nutrition Treatment Plan/Goals Established with Patient/Caregiver: Yes Planned Treatment/Interventions: Dysphagia Therapy ST Frequency: 1x week ST Duration: For 1 week  GOALS Encounter Problems     Encounter Problems (Active)     Dysphagia     Patient will consume an oral diet without pulmonary compromise using recommended compensatory strategies (Progressing)     Start:  02/15/24    Expected End:  02/29/24         Patient will participate in instrumental swallowing evaluation(s) to examine  the oropharyngeal swallow, advise therapeutic interventions, and to assist with diet recommendations (Completed)     Start:  02/16/24  Expected End:  02/18/24    Resolved:  02/16/24      Patient will complete swallowing exercise plan to target oral control / coordination, lingual strength, hyolaryngeal excursion, UES opening, pharyngeal contraction / squeeze, and tongue base retraction / posterior pharyngeal wall approximation     Start:  02/16/24    Expected End:  03/08/24         Patient/Caregiver will verbalize understanding of patient's swallow function, precautions and need for modified diet (Progressing)     Start:  02/16/24    Expected End:  03/08/24             Total Treatment Minutes: 22 Time in Timed Codes:  1350 - 1412         Oral Function (Swallow) Therapy minutes: 22 Mins  Charges           02/24/2024   Code Description Service Provider Modifiers Quantity  [id number] Hc St Oral Function Therapy Leita Arnett Murders, SLP GN 1        Time of Service Note Type Status  1250 Progress Notes Signed

## 2024-02-24 NOTE — Care Plan (Signed)
 Patient awake, oriented to self, calm, obeys command. No pan per Cyrena Lee  Faces scoring. Bilateral Peri - orbital bruises. On RA with on and off cough with clear and frothy sputum.  Meds given with thin liquid, no signs of aspiration.  Assisted to bathroom with walker, able to void without difficulty. Bladder scanning done - 213.  SKIN : multiple bruises, dry skin, Right hand skin tear and Right knee abrasion.  Bed alarm on, frequent rounding done.  Problem: Knowledge Deficit Goal: Patient/family/caregiver demonstrates understanding of disease process, treatment plan, medications, and discharge instructions Description: Complete learning assessment and assess knowledge base. Outcome: Progressing Problem: Compromised Skin Integrity Goal: Skin integrity is maintained or improved Description: Assess and monitor skin integrity. Identify patients at risk for skin breakdown on admission and per policy. Collaborate with interdisciplinary team and initiate plans and interventions as needed. Outcome: Progressing Goal: Fluid and electrolyte balance are achieved/maintained Description: Assess and monitor vital signs (orthostatic vitals if applicable), fluid intake and output, urine color, labs, skin turgor, mucous membranes, jugular venous distention, edema, circumference of edematous extremities and abdominal girth, respiratory status, and mental status.  Monitor for signs and symptoms of hypovolemia (tachycardia, rapid breathing, decreased urine output, postural hypotension, confusion, syncope).  Monitor for signs and symptoms of hypervolemia (strong rapid pulse, shortness of breath, difficulty breathing lying down, crackles heard in lung fields, edema). Collaborate with interdisciplinary team and initiate plan and interventions as ordered. Outcome: Progressing Problem: Compromised Skin Integrity Goal: Fluid and electrolyte balance are achieved/maintained Description: Assess and monitor vital signs  (orthostatic vitals if applicable), fluid intake and output, urine color, labs, skin turgor, mucous membranes, jugular venous distention, edema, circumference of edematous extremities and abdominal girth, respiratory status, and mental status.  Monitor for signs and symptoms of hypovolemia (tachycardia, rapid breathing, decreased urine output, postural hypotension, confusion, syncope).  Monitor for signs and symptoms of hypervolemia (strong rapid pulse, shortness of breath, difficulty breathing lying down, crackles heard in lung fields, edema). Collaborate with interdisciplinary team and initiate plan and interventions as ordered. Outcome: Progressing Problem: Compromised Skin Integrity Goal: Fluid and electrolyte balance are achieved/maintained Description: Assess and monitor vital signs (orthostatic vitals if applicable), fluid intake and output, urine color, labs, skin turgor, mucous membranes, jugular venous distention, edema, circumference of edematous extremities and abdominal girth, respiratory status, and mental status.  Monitor for signs and symptoms of hypovolemia (tachycardia, rapid breathing, decreased urine output, postural hypotension, confusion, syncope).  Monitor for signs and symptoms of hypervolemia (strong rapid pulse, shortness of breath, difficulty breathing lying down, crackles heard in lung fields, edema). Collaborate with interdisciplinary team and initiate plan and interventions as ordered. Outcome: Progressing Problem: Urinary Incontinence Goal: Perineal skin integrity is maintained or improved Description: Assess genitourinary system, perineal skin, labs (urinalysis), and history of incontinence to include past management, aggravating, and alleviating factors.  Collaborate with interdisciplinary team and initiate plans and interventions as needed. Outcome: Progressing Problem: PAIN - ADULT Goal: Verbalizes/displays adequate comfort level or baseline comfort level Description:  INTERVENTIONS: 1. Encourage pt to monitor pain and request assistance 2. Assess pain using appropriate pain scale 3. Administer analgesics based on type and severity of pain and evaluate response 4. Implement non-pharmacological measures as appropriate and evaluate response 5. Consider cultural and social influences on pain and pain management 6. Notify LIP if interventions unsuccessful or patient reports new pain Outcome: Progressing Problem: Safety - Adult Goal: Free from fall injury Description: INTERVENTIONS: 1. Assess pt frequently for physical needs 2. Identify cognitive  and physical deficits and behaviors that affect risk of falls. 3. Institute fall precautions as indicated by assessment. 4. Educate pt/family on patient safety including physical limitations 5. Instruct pt to call for assistance with activity based on assessment 6. Modify environment to reduce risk of injury 7. Consider OT/PT consult to assist with strengthening/mobility Outcome: Progressing Problem: Health Behavior Goal: Knowledge of disease or condition will improve Outcome: Progressing Problem: Safety: Goal: Will remain free from falls by discharge Description: Will remain free from falls by discharge Outcome: Progressing

## 2024-02-24 NOTE — Progress Notes (Signed)
 Case Management Discharge Note        CSN: [id number] DOB: 1940/10/12 Service: General Medicine Location: 779/01  Patient Class: Inpatient  DC Disposition: : Inpatient Rehab Facility  Discharge DC Disposition: : Inpatient Rehab Facility Placement Facility Type: Acute Rehab Acute Rehab: [facility] Inpatient Rehabilitation  Discharge Referrals Patient Preference: Chosen geographical local area/county shared with patient/family: Return/previous involvement Patient Preference for Post-Acute Provider Form completed: Return/Previous Involvement Case closed, patient/family agree with disposition plan: Yes      Post Acute Placement Status: Coordination complete.   Case Management Coordination Status: Coordination Complete     Helayne DELENA Ever Douglas, MSW

## 2024-02-24 NOTE — Progress Notes (Signed)
" °   02/24/24 1250  Reason Eval/Treat Not Completed  Reason Eval/Treat Not Completed Patient Unavailable   Patient currently off unit for head CT d/t change in status. Will continue efforts for swallow tx later this date or next.    "

## 2024-02-24 NOTE — Consults (Addendum)
 Nutrition Note   PATIENT NAME: Jose Higgins [employer] DATE: 02/24/2024  TIME: 2:02 PM  Note Type: Follow-up Referral Reason: LOS  Assessment  1/28: Pt off unit. Per MD note, after a shared decision-making discussion involving the care team and the family, we collectively agreed that proceeding with PEG tube is not appropriate given the risk of inadvertent line removal and the family's expressed concerns. Plan to discharge to rehab tomorrow per CM note. Pt with poor PO intake. 0% of meals. No nausea,vomiting,abdominal pain,abdominal bloating noted.   1/22: Pt is a 84 year old male with history of type 2 diabetes, hypertension, oral anticoagulant related to atrial fibs.  Presented to emergency department on January 5 after suffering a fall per H&P. Found to have Intracranial hemorrhage due to trauma presenting with vasogenic edema 2/2 hyponatremia. Pt seen resting comfortably this AM. Family not present. MST:0. Pt with poor PO intake. Ensure Plus TID in place. Per chart, family is considering a PEG tube placement for nutritional feedings.    Interventions  Nutrition Intervention: Collaboration and referral of nutrition care, Medical food supplements  Diet per SLP team Ensure Plus HP TID Encourage PO intake Recommend monitor of lytes and replete as indicated    Recommend appetite stimulant  Obtain new weight     Nutrition Diagnosis                        Nutrition Diagnosis: Inadequate oral intake Problem Related To: Acute illness Problem as Evidenced By: 0%-25% of meals       Nutrition Focused Physical Findings  Deferred Due To: Other (Off unit)  Muscle Mass Depletion:     Body Fat Depletion:              Pertinent Information   Weights (last 14 days)     Date/Time Weight   02/23/24 0600 74.8 kg (164 lb 14.5 oz)   02/22/24 0536 75.2 kg (165 lb 12.6 oz)   02/19/24 0600 77.7 kg (171 lb 4.8 oz)   02/18/24 0536 79.5 kg (175 lb 4.3 oz)   02/13/24 0600 81 kg (178 lb 9.2 oz)    02/10/24 1530 85.8 kg (189 lb 2.5 oz)   02/10/24 0100 83.9 kg (184 lb 15.5 oz)       Wt Readings from Last 20 Encounters:  02/23/24 74.8 kg (164 lb 14.5 oz)  02/13/24 81 kg (178 lb 9.2 oz)  02/02/24 87.3 kg (192 lb 7.4 oz)  02/01/24 86.4 kg (190 lb 7.6 oz)  12/01/19 97.3 kg (214 lb 8.1 oz)   Weight trended down. No edema noted.   Possible 7.8% wt loss in one month- Significant.   Weight History/Comments: . Weight Classification: For adult > 61 years old, Normal BMI: 23.66 Skin Integrity: No pressure injury noted Nutrition Related Medications: amLODIPine,lipitor,lovenox ,humalog,lopressor ,potassium chloride ,QUEtiapine,flomax,sodium chloride  0.9 % infusion 75 ml/hr Labs: A1C:7.0,Na:135,Glucose:172,Mg:1.7 GI Symptoms: Other (Comment) Last BM Date: 02/20/23    Current Diet and Nutrition Support Details  Diet Dysphagia; Thin liquids (no thickener added); Pureed foods (Level 4); Fluid Restricted; 1500 mL Fluid. Meal intake: 0%-33% of meals, ENsure Plus x1 ,120 mL. Ensure Plus TID.  Enteral Nutrition                 Parenteral Nutrition           Total Regimen Provides      Estimated Needs  Nutrition Needs Based On: Actual weight (79.5 kg)  -1/22  Calories (kcal/day): 8011-7614  Calories based on:  25-30 kcal/kg  Protein (g/day): 80-95  Protein based on: 1-1.2g/kg  Fluid (mL/day):   1-1.1 mL/kcal, Per MD      Monitoring & Evaluation  Nutrition Goals: Adequate PO, Appropriate glucose control, Other (comment), GI tolerance, Maintain nutritional status (Weight,labs) Nutrition Goal Status: Progressing, continue   Follow-up Information  Follow-Up Date: 03/02/24 Follow-Up Reason: Reassessment, NFPE completion   Contact RD on treatment team. After hours or weekends, use secure chat group: [facility]  [facility]it[facility] [place]  [facility]  [facility] Dietitians [facility]  [facility] Dietitians [facility]  [facility]ians   Marvis Killian, RD 02/24/2024 2:02 PM

## 2024-02-24 NOTE — Care Plan (Signed)
 Patient arrived to [facility] from main hospital alert and orientated. Mod A from bed to bathroom using rolling walker. Denies pain.  Problem: Nutritional: Goal: Maintenance of adequate nutrition and hydration will improve Outcome: Progressing   Problem: Health Behavior Goal: Knowledge of disease or condition will improve Outcome: Progressing   Problem: Compromised Skin Integrity Goal: Nutritional status is improving Description: Monitor and assess patient for malnutrition (ex- brittle hair, bruises, dry skin, pale skin and conjunctiva, muscle wasting, smooth red tongue, and disorientation). Collaborate with interdisciplinary team and initiate plan and interventions as ordered.  Monitor patient's weight and dietary intake as ordered or per policy. Utilize nutrition screening tool and intervene per policy. Determine patient's food preferences and provide high-protein, high-caloric foods as appropriate.  Outcome: Progressing

## 2024-02-24 NOTE — Discharge Summary (Signed)
 [facility] Hospitalist Discharge Summary  Identifying Information:  [facility] 1940-05-18 [id number]  Admit date: 02/09/2024  Discharge date: 02/24/2024  Discharge Service: [facility] Hospitalist  Discharge Attending Physician:Eshwar Braxton, MD  Discharge to: Rehabilitation facility  Discharge Diagnoses: Active Problems:   Type 2 diabetes mellitus (CMD)   HTN (hypertension)   HLD (hyperlipidemia)   Intracranial hemorrhage due to trauma presenting with vasogenic edema 2/2 hyponatremia   IPH (idiopathic pulmonary hemosiderosis) (CMD)   Vasogenic brain edema    (CMD)   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  On admission, he was noted to be increasingly lethargic and confused, with a serum sodium of 128 and complaints of headache. Head CT on 02/09/2024 demonstrated a slight increase in the size of the left frontal lobe intraparenchymal hematoma with worsening vasogenic edema and mild mass effect, including a 3-5 mm midline shift; other hemorrhages were stable. Neurosurgery was consulted and recommended close neurological monitoring, correction of hyponatremia, and holding anticoagulation and antiplatelet agents, with no acute surgical intervention indicated. He was admitted to the ICU for hourly neuro checks and correction of hyponatremia with 3% saline infusion and intermittent boluses, resulting in  improvement of serum sodium to 144 and resolution of hyponatremia. Serial CT imaging showed stable findings with no new hemorrhage or expansion of contusions, and neurosurgery continued to recommend conservative management and long-term discontinuation of anticoagulation due to fall risk and gait unsteadiness.  During the admission, his mental status remained delirious but improved with time; he was intermittently confused and somnolent but able to follow simple commands. Agitation and impulsivity required intermittent use of restraints and pharmacologic management with Precedex, Haldol, olanzapine, and Seroquel for safety, with Haldol discontinued prior to discharge. 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 He experienced persistent agitation and impulsivity, requiring close supervision and intermittent use of restraints, particularly during episodes of urinary retention and after foley catheter placement; the foley catheter was ultimately removed on 1/23, and he was able to void without further retention.  Blood pressure was managed with metoprolol  and PRN labetalol and hydralazine, with amlodipine added for further control; serum potassium and magnesium  were repleted as needed. His diabetes was managed with a consistent carbohydrate diet and subcutaneous insulin , with oral agents held. He received physical and occupational therapy evaluations, which identified significant deficits in mobility, balance, and activities of daily living, necessitating moderate assistance and recommending post-acute rehabilitation; physical therapy on 1/26 noted improved command following and engagement in mobility, but ongoing high fall risk and need for continuous supervision for transfers and ambulation.  Speech-language pathology evaluation identified dysphagia with significant aspiration risk; a modified barium swallow on 1/20  revealed mild oral and moderate pharyngeal dysphagia, with recommendations for pureed diet and thin liquids, strict aspiration precautions, and consideration of alternative nutrition if intake remained poor. After shared decision-making with the family, PEG tube placement was deferred due to concerns for inadvertent line  removal and family preference. Nutrition consult noted poor oral intake and significant weight loss, with Ensure Plus supplementation and monitoring for malnutrition.  By discharge, his principal problems of altered mental status and hyponatremia had resolved, though he remained intermittently confused and somnolent. He was medically cleared for discharge to a skilled nursing facility on 02/22/2024, with ongoing needs for neuro checks, BP and sodium monitoring, dysphagia precautions, and assistance with all ADLs and mobility. Eliquis  was discontinued per neurosurgery due to recurrent intracranial hemorrhage and high fall risk. Metoprolol  was switched to tartrate 25 mg BID, and amlodipine 5 mg daily was started for blood pressure control. Jardiance-metformin was held due to acute illness and inconsistent PO intake. Seroquel was used for agitation, with Haldol and Valium PRN for severe agitation. Flomax was continued for urinary retention. He was instructed to follow up with neurosurgery in 2-3 weeks with repeat CT head, and ongoing SLP evaluation at SNF was recommended due to aspiration risk.  Additional issues addressed during the admission included constipation, which was managed with Miralax , and ongoing monitoring for skin integrity, fall risk, and nutritional status. He required assistance with all activities of daily living and was at high risk for falls, necessitating close supervision and fall precautions throughout his hospitalization and at discharge. He was accepted for transfer to [facility].   Predictive Model Details        21.4% (Medium)  Factor Value   Calculated 02/24/2024 12:06 16%  Number of ED visits in last 90 days 3   Readmission Risk Score v2 Model 10% Braden score 17    8% Latest RDW in last 72 hrs 13.2 %    8% Number of active outpatient medication orders 28    7% Latest hemoglobin in last 72 hrs 14.6 g/dL     Patient seems to be stable for the discharge to [facility].  Patient has a high potential for readmission due to multiple medical problems.  Post Discharge Follow Up Issues:  I would like to request the physician at [facility]/staff at the [facility] to follow the bladder protocol as he was not able to void this morning. Also would like to request for reassessing blood pressure and adjust antihypertensive accordingly. Patient had a repeat CT head this morning and seems to be stable.   Procedures: No admission procedures for hospital encounter. _____________________________________________________________________________ Discharge Day Services: BP 127/76 (BP Location: Left arm, Patient Position: Lying)   Pulse 107   Temp 97.5 F (36.4 C) (Axillary)   Resp 18   Ht 1.778 m (5' 10)   Wt 74.8 kg (164 lb 14.5 oz)   SpO2 100%   BMI 23.66 kg/m  Pt seen on the day of discharge and determined appropriate for discharge.   Condition at Discharge: fair  Length of Discharge: I spent 45 mins in the discharge of this patient. _____________________________________________________________________________ Discharge Medications: Patient Instructions:    Discharge Medications    Will continue current medication regimen.    _____________________________________________________________________________ Pending Test Results (if blank, then none):    Most Recent Labs:  Lab Results  Component Value Date/Time   WBC 7.97 02/24/2024 1007   RBC 4.72 02/24/2024 1007   HGB 14.6 02/24/2024 1007   HCT 41.6 02/24/2024 1007   PLT 193 02/24/2024 1007    Lab Results  Component Value Date   WBC 7.97 02/24/2024   HGB 14.6 02/24/2024   HCT 41.6 02/24/2024   PLT 193 02/24/2024     Lab Results  Component Value Date   CO2  24 02/24/2024   BUN 18 02/24/2024   GLUCOSE 172 (H) 02/24/2024   CREATININE 0.89 02/24/2024   CALCIUM  9.7 02/24/2024   ALBUMIN 3.4 (L) 02/09/2024   AST 15 02/09/2024   ALT 17 02/09/2024    Lab Results  Component Value Date   NA 135 (L) 02/24/2024   K 3.8 02/24/2024   CL 101 02/24/2024   CO2 24 02/24/2024   BUN 18 02/24/2024   CREATININE 0.89 02/24/2024   CALCIUM  9.7 02/24/2024   MG 1.7 (L) 02/24/2024   PHOS 3.5 02/19/2024    Lab Results  Component Value Date   BILITOT 1.8 (H) 02/09/2024   PROT 6.7 02/09/2024   ALBUMIN 3.4 (L) 02/09/2024   ALT 17 02/09/2024   AST 15 02/09/2024   GGT 11 02/01/2024    Lab Results  Component Value Date   INR 1.3 02/09/2024   Hospital Radiology:  CT Head WO Contrast W Quant CT Tiss Character When Performed Result Date: 02/24/2024 CT HEAD WITHOUT CONTRAST, 02/24/2024 12:44 PM INDICATION: ams COMPARISON: CT head 02/17/2024. TECHNIQUE: Axial CT images of the brain from skull base to vertex, including portions of the face and sinuses, were obtained without contrast. Supplemental 2D reformatted images were generated and reviewed as needed. All CT scans at [facility] and [facility] are performed using radiation dose optimization techniques as appropriate to a performed exam, including but not limited to one or more of the following: automatic exposure control, adjustment of the mA and/or kV according to patient size, use of iterative reconstruction technique. In addition, our institution participates in a radiation dose monitoring program to optimize patient radiation exposure. FINDINGS: Calvarium/skull base: The propagates through the right temporal bone with persistent fluid/hemorrhage in right mastoid air cells and middle ear. Paranasal sinuses: Interval resolution of previous mucosal thickening and aerated secretions in the sphenoid sinuses and right frontal sinus.  Brain: Persistent but improved edema in the anterior left temporal lobe with decreased conspicuity of prior hemorrhagic contusion. There is also persistent but decreased edema associated with prior left frontal lobe hemorrhagic contusion, also with decreasing conspicuity of blood products. Developing encephalomalacia and gliosis within the right anterior frontal lobe. Previous multifocal subarachnoid hemorrhage and subdural hemorrhage have resolved. No new or progressive mass effect. No specific evidence for an acute large vessel territory infarction. Similar brain parenchymal volume loss with ex vacuo enlargement of the ventricular system. No hydrocephalus. No new site of intracranial hemorrhage. Intracranial atherosclerosis.   1.  No acute intracranial abnormality. Consider MRI for further evaluation if there is ongoing clinical concern. 2.  Persistent but improved edema associated with prior hemorrhagic contusions in the left frontal and left temporal lobes. 3.  Developing encephalomalacia and gliosis in the right anterior frontal lobe. 4.  Resolution of previous multifocal subarachnoid hemorrhage and subdural hemorrhage.   XR Spine Cervical 2-3 Views Result Date: 02/20/2024 Date of Service: 2024-02-20 12:21:00 RADIOGRAPHS OF THE CERVICAL SPINE 2 OR 3 VIEWS INDICATION:  Neck pain. COMPARISON:  None. FINDINGS: Vertebrae:  Multilevel facet arthropathy.  No definite fracture.  Normal alignment. Disc spaces:  No acute findings.  No significant narrowing. Soft tissues:  Unremarkable.   1. Multilevel facet arthropathy. Electronically Signed by: Toribio Lennie BRUNS. on 02/20/2024 13:28:00 US [place] on workstation ID: [id number]  CT Head WO Contrast W Quant CT Tiss Character When Performed Result Date: 02/17/2024 CT HEAD WITHOUT CONTRAST, 02/17/2024 3:18 PM INDICATION: R/O bleed COMPARISON: Multiple prior CT and MRI studies most recently dated 02/13/2024.  TECHNIQUE: Axial CT images of the brain from skull base to vertex,  including portions of the face and sinuses, were obtained without contrast. Supplemental 2D reformatted images were generated and reviewed as needed. All CT scans at [facility] and [facility] are performed using radiation dose optimization techniques as appropriate to a performed exam, including but not limited to one or more of the following: automatic exposure control, adjustment of the mA and/or kV according to patient size, use of iterative reconstruction technique. In addition, our institution participates in a radiation dose monitoring program to optimize patient radiation exposure. FINDINGS: Multicompartment intracranial hemorrhage with sites of decreasing density of parenchymal hemorrhagic contusions, subarachnoid hemorrhage, and subdural hemorrhage primarily along the left tentorium and occipital convexity. There are residual hyperattenuating blood products primarily along the posterior falx, tentorium, and within the subarachnoid space. No evidence of new or increasing intracranial hemorrhage. Similar edema associated with parenchymal contusions in the frontal lobes and left anterior temporal lobe with similar mass effect deforming adjacent structures and resulting in a similar degree of rightward displacement of the paramedian left frontal lobe and anterior septum pellucidum. No progressive mass effect. No progressive ventricular enlargement. Similar alignment to the right parieto-occipital calvarial fracture propagating into the right temporal bone and skull base, as previously described. Nonspecific fluid and aerated secretions in the paranasal sinuses.   1.  Evolving multicompartment intracranial hemorrhage with decreasing density of blood products. No evidence of new or increasing intracranial hemorrhage. 2.  Similar edema associated with multifocal parenchymal contusions without progressive mass effect. 3.  Similar caliber of the ventricular system without  evidence of hydrocephalus.  FL Esophagus Barium Swallow With Video And Speech Result Date: 02/16/2024 [facility] ESOPHAGUS BARIUM SWALLOW WITH VIDEO AND SPEECH, 02/16/2024 2:48 PM INDICATION:  Dysphagia, recent fall with intracranial hemorrhage COMPARISON: None. EXAM: Patient refused to swallow barium, the exam was aborted.   Non diagnostic study. Aimee Han, PA-C was present in the fluoroscopy room during the exam under the supervision of Gordy Roulette, M.D.   [facility] Chest 1 View Result Date: 02/15/2024 Date of Service: 2024-02-14 13:08:00 EXAM: XR Chest, 1  View. CLINICAL HISTORY: cough, rule out aspiration or penumonia. COMPARISON: None provided.  FINDINGS: LUNGS AND PLEURA: The lungs are clear. No pleural effusion or pneumothorax. HEART AND MEDIASTINUM: The heart size and mediastinal contours are normal. BONES: No acute osseous abnormality.   No acute cardiopulmonary pathology. Electronically signed by: Leni Potash, MD on 02/15/2024 06:00:01 PM [facility]  MRI Brain WO Contrast Result Date: 02/14/2024 Date of Service: 2024-02-13 11:50:00 EXAM: MR Brain Without Intravenous Contrast. CLINICAL HISTORY: AMS, recent hx of intraparynchymal hemorrhage. TECHNIQUE: Magnetic resonance images of the brain without intravenous contrast in multiple planes. CONTRAST: Without COMPARISON: CT dated 02/11/2024 FINDINGS: BRAIN: There persistent hemorrhagic contusions in the frontal regions bilaterally, markedly more severe on the left. Extra-axial hemorrhagic changes in the subdural and subarachnoid spaces are seen over the frontal lobes bilaterally, more so on the left. Edema surrounds these areas with attenuation of the anterior horn of the left lateral ventricle.There appear to be subdural hemorrhages along the inferior posterior aspect of the temporal and occipital lobes bilaterally with some involvement of the tentorium. Trace subarachnoid hemorrhages seen in the sylvian fissures. The brainstem and posterior fossa are essentially  unremarkable. No significant midline shift.No cerebellar tonsillar ectopia. The central arterial and venous flow voids are patent. VENTRICLES: No hydrocephalus. ORBITS: The orbits are normal. SINUSES AND MASTOIDS: There is fluid in the right  mastoids and middle ear cavity. Scattered mucosal thickening is seen throughout the paranasal sinuses. Theleft mastoid air cells are clear. BONES: No focal osseous lesion.   Allowing for difference in technique relatively stable suspected posttraumatic findings as noted Electronically signed by: Marinda Silversmith, MD on 02/14/2024 09:10:52 AM [facility]  CT Head WO Contrast W Quant CT Tiss Character When Performed Result Date: 02/11/2024 CT HEAD WITHOUT CONTRAST, 02/11/2024 9:22 AM INDICATION: Mental Status Change, Injury, unspecified, initial encounter \ T14.90XA Injury, unspecified, initial encounter COMPARISON: CT head of 02/09/2024 TECHNIQUE: Axial CT images of the brain from skull base to vertex, including portions of the face and sinuses, were obtained without contrast. Supplemental 2D reformatted images were generated and reviewed as needed. All CT scans at [facility] and [facility] are performed using radiation dose optimization techniques as appropriate to a performed exam, including but not limited to one or more of the following: automatic exposure control, adjustment of the mA and/or kV according to patient size, use of iterative reconstruction technique. In addition, our institution participates in a radiation dose monitoring program to optimize patient radiation exposure. FINDINGS: Calvarium/skull base: Similar right eccentric occipital and temporal fracture, described in further detail on prior exams. Similar partial opacification of the right mastoid air cells. Paranasal sinuses: Similar air-fluid levels in the right frontal and sphenoid sinuses. Scattered mucosal thickening. Brain: Similar left anterior frontal lobe  intraparenchymal hematoma with associated perilesional edema and local mass effect. Similar additional bifrontal and left temporal intraparenchymal hematomas/hemorrhagic contusions. Similar scattered small volume subarachnoid hemorrhage within the interhemispheric fissure and in multiple sulci overlying bilateral frontal, bilateral parietal, bilateral temporal, and left occipital lobes. Slightly increased trace hemorrhage layering in bilateral occipital horns of the lateral ventricles. Similar thin subdural hematoma along the posterior falx and bilateral tentorial leaflets. Similar trace rightward midline shift. Similar size and morphology of the ventricular system without evidence of hydrocephalus.   1.  Slightly increased trace intraventricular hemorrhage without significant change in size of the ventricular system. 2.  Otherwise, similar multicompartment intracranial hemorrhage and mass effect, further detailed above.  ECG 12 lead Result Date: 02/10/2024 Ventricular Rate                   70        BPM                 QRS Duration                       86        ms                  Q-T Interval                       426       ms                  QTC Calculation Bazett             460       ms                  Calculated R Axis                  -33       degrees             Calculated T Axis  137       degrees             Atrial fibrillation LVH Nonspecific T wave changes Prolonged QT interval, consider electrolyte imbalance or drug effects When compared with ECG of 01-Feb-2024 09 02, No significant change Confirmed by Rojelio Dunnings  [phone]  on 02-10-2024 5 40 22 AM  XR Chest 1 View Result Date: 02/09/2024 Date of Service: 2024-02-09 19:53:00 EXAM: XR Chest, 1 View. CLINICAL HISTORY: Generalized Weakness. COMPARISON: 02/02/24. FINDINGS: LUNGS AND PLEURA: The lungs are clear. No pleural effusion or pneumothorax. HEART AND MEDIASTINUM: The heart size and mediastinal contours are normal. BONES: No  acute osseous abnormality.   No acute cardiopulmonary pathology. Electronically signed by: Christin Curb, MD on 02/09/2024 09:53:02 PM [facility] [place]  CT Head WO Contrast W Quant CT Tiss Character When Performed Addendum Date: 02/09/2024 Date of Service: 2024-02-09 20:24:00 -----ADDENDUM-----: Receipt of this report by the clinical staff was confirmed with Leita Phenix, MD by Nathanael Beckwith on Feb 09, 2024 21:29:00 EST. Electronically signed by: Jantovsky, Madalyne on 02/09/2024 09:29:27 PM [facility] -----ORIGINAL REPORT-----: EXAM: CT Head Without Intravenous Contrast. CLINICAL HISTORY: Recent ICH, AMS. TECHNIQUE: Axial computed tomography images of the head/brain without intravenous contrast. COMPARISON: 02/02/24. FINDINGS: BRAIN: Previously noted left frontal lobe intraparenchymal hematomas have slightly increased in size currently measuring up to 2.4 x 2.3 cm with worsening vasogenic edema noted.  Mild increased mass-effect seen in the left lateral ventricle with approximately 0.5 cm left to right midline shift along the anterior left frontal lobe.  Close continued follow-up recommended. Stable subdural hemorrhage is seen along the bilateral frontal lobes and along the bilateral parietal convexities.  Stable scattered subarachnoid hemorrhage noted.  Stable intraventricular hemorrhage. SOFT TISSUES: No significant facial or scalp soft tissue swelling evident. No radiopaque foreign body is seen. BONES: No acute skull fracture. IMPRESSION: Previously noted left frontal lobe intraparenchymal hematomas have slightly increased in size currently measuring up to 2.4 x 2.3 cm with worsening vasogenic edema noted.  Mild increased mass-effect seen in the left lateral ventricle with approximately 0.5 cm left to right midline shift along the anterior left frontal lobe.  Close continued follow-up recommended. Remaining intraparenchymal hemorrhages are grossly stable. Electronically signed by: Christin Curb, MD on 02/09/2024  09:24:58 PM [facility] [place] Per PQRS, all CT exams are performed using one or more of the following dose reduction techniques: automated exposure control, adjustment of the mA and/or kV according to patient size, or use of iterative reconstruction technique.   Result Date: 02/09/2024 Date of Service: 2024-02-09 20:24:00 EXAM: CT Head Without Intravenous Contrast. CLINICAL HISTORY: Recent ICH, AMS. TECHNIQUE: Axial computed tomography images of the head/brain without intravenous contrast. COMPARISON: 02/02/24. FINDINGS: BRAIN: Previously noted left frontal lobe intraparenchymal hematomas have slightly increased in size currently measuring up to 2.4 x 2.3 cm with worsening vasogenic edema noted.  Mild increased mass-effect seen in the left lateral ventricle with approximately 0.5 cm left to right midline shift along the anterior left frontal lobe.  Close continued follow-up recommended. Stable subdural hemorrhage is seen along the bilateral frontal lobes and along the bilateral parietal convexities.  Stable scattered subarachnoid hemorrhage noted.  Stable intraventricular hemorrhage. SOFT TISSUES: No significant facial or scalp soft tissue swelling evident. No radiopaque foreign body is seen. BONES: No acute skull fracture.   Previously noted left frontal lobe intraparenchymal hematomas have slightly increased in size currently measuring up to 2.4 x 2.3 cm with worsening vasogenic edema noted.  Mild increased mass-effect seen in the left  lateral ventricle with approximately 0.5 cm left to right midline shift along the anterior left frontal lobe.  Close continued follow-up recommended. Remaining intraparenchymal hemorrhages are grossly stable. Electronically signed by: Christin Curb, MD on 02/09/2024 09:24:58 PM [facility] Per PQRS, all CT exams are performed using one or more of the following dose reduction techniques: automated exposure control, adjustment of the mA and/or kV according to patient size, or use of iterative  reconstruction technique.   CT Head WO Contrast W Quant CT Tiss Character When Performed Result Date: 02/02/2024 CT HEAD WITHOUT CONTRAST, 02/02/2024 10:09 AM INDICATION: trauma COMPARISON: CT head 02/01/2024 TECHNIQUE: Axial CT images of the brain from skull base to vertex, including portions of the face and sinuses, were obtained without contrast. Supplemental 2D reformatted images were generated and reviewed as needed. All CT scans at [facility] and [facility] Imaging are performed using radiation dose optimization techniques as appropriate to a performed exam, including but not limited to one or more of the following: automatic exposure control, adjustment of the mA and/or kV according to patient size, use of iterative reconstruction technique. In addition, our institution participates in a radiation dose monitoring program to optimize patient radiation exposure. FINDINGS: Calvarium/skull base: Facial, calvarial, and skull base fractures as previously described. Paranasal sinuses: Similar scattered areas of paranasal sinus opacification. Brain: Similar multicompartment intracranial hemorrhages, including similar size of hemorrhagic contusions in the left greater than right frontal and temporal lobes. Similar volume of extra-axial blood products along the bilateral frontal lobes and subdural hemorrhage along the bilateral parietal convexities. Similar volume of scattered subarachnoid hemorrhage and small volume hemorrhage within the lateral ventricles. No progressive intracranial mass effect. Similar size and configuration of the ventricular system.   1.  Similar multicompartment intracranial hemorrhage, as described above. No increasing mass effect or hydrocephalus. 2.  No acute large vascular territory infarct. 3.  Similar calvarial, facial, and skull base fractures.  XR Chest 1 View Result Date: 02/02/2024 XR CHEST 1 VIEW, 02/02/2024 3:42 PM INDICATION:c/f pneumonia COMPARISON:  Chest radiograph and CT CAP 02/01/2024 FINDINGS: Supportive devices: Telemetry leads overlie the chest. Cardiovascular/lungs/pleura: Similar cardiomediastinal silhouette. Thoracic aortic calcifications. Mildly increased interstitial opacities. No pleural effusions. No discernible pneumothorax. Other: Polyarticular degenerative changes.   Mild pulmonary interstitial edema.   CT Head WO Contrast W Quant CT Tiss Character When Performed Result Date: 02/02/2024 CT HEAD WITHOUT CONTRAST, 02/01/2024 6:28 PM INDICATION: trauma COMPARISON: CT head 02/01/2024. TECHNIQUE: Axial CT images of the brain from skull base to vertex, including portions of the face and sinuses, were obtained without contrast. Supplemental 2D reformatted images were generated and reviewed as needed. All CT scans at [facility] and [facility] Imaging are performed using radiation dose optimization techniques as appropriate to a performed exam, including but not limited to one or more of the following: automatic exposure control, adjustment of the mA and/or kV according to patient size, use of iterative reconstruction technique. In addition, our institution participates in a radiation dose monitoring program to optimize patient radiation exposure. FINDINGS: Calvarium/skull base: Redemonstrated a nondisplaced fracture coursing inferiorly from the right mastoid temporal bone along the spine of the sphenoid bone superiorly along the occipitomastoid suture and into the right occipital and parietal bones. Ongoing right middle ear and mastoid opacification. Gas dissecting along the temporomandibular joint and parapharyngeal space, similar to prior imaging. Ongoing right posterior scalp hematoma. Please see CT face dated 02/01/2024 for evaluation of facial fractures, including nondisplaced fracture  of the right superior orbital rim, right skull base, and possible fracture of the right maxillary sinus posterior wall. Lens  replacements. Paranasal sinuses: Scattered opacification of the ethmoid and sphenoid air cells, which could represent secretions versus blood product. Brain: Similar extent of multi compartmental hemorrhage, including left inferior frontal lobe and left greater than right inferior temporal lobe intraparenchymal contusions with similar local mass effect and adjacent edema. Similar small right inferior frontal lobe intraparenchymal hemorrhagic contusion. Similar volume extra-axial blood product along bilateral frontal lobes with slight interval reduced patient. Similar volume of bilateral cerebral convexity subdural hematomas more posteriorly along bilateral frontal parietal convexities. Similar small volume intraventricular hemorrhage layering within bilateral occipital horns. Similar blood product layering along the falx, tentorial leaflets, and sylvian fissures. Similar size of the ventricular system. Trace pneumocephalus along the falx. No increasing mass effect. No large vascular territory infarction.   1.  Similar extent of multi-compartmental hemorrhage, as further described above. No increasing mass effect or hydrocephalus. 2.  No acute large vascular territory infarction. 3.  Redemonstrated calvarial, facial, and skull base fractures, as further described above.   XR Chest 1 View Result Date: 02/02/2024 XR CHEST 1 VIEW, 02/01/2024 11:48 AM INDICATION:Trauma COMPARISON: None FINDINGS: Supportive devices: Cervical collar. Telemetry leads overlie the chest. Cardiovascular/lungs/pleura: Cardiac silhouette and pulmonary vasculature are within normal limits. Thoracic aortic calcifications. Bilateral perihilar and paracardiac streaky opacities. No pleural effusions. No discernible pneumothorax. Other: No acute displaced fractures. Polyarticular degenerative changes.   Bilateral perihilar and paracardiac streaky opacities, favoring pulmonary edema or atelectasis.   CT Spine Thoracic Lumbar Reformats W  Contrast Result Date: 02/01/2024 CT THORACIC AND LUMBAR SPINE REFORMATS WITH CONTRAST, 02/01/2024 12:07 PM INDICATION: Mid-back pain, trauma COMPARISON: None TECHNIQUE: Thin-section axial CT images of the entire thoracic and lumbar spine were acquired after intravenous administration of iodine-based contrast as part of a chest, abdomen, and pelvis CT examination. A request was made for the original data set to be reformatted in the coronal and sagittal planes using bone algorithm and reviewed as a screen for bony spinal pathology. All CT scans at [facility] and [facility] Imaging are performed using radiation dose optimization techniques as appropriate to a performed exam, including but not limited to one or more of the following: automatic exposure control, adjustment of the mA and/or kV according to patient size, use of iterative reconstruction technique. In addition, our institution participates in a radiation dose monitoring program to optimize patient radiation exposure. LEVELS IMAGED: Lower cervical region to upper sacrum. The sacrum is more completely evaluated on the CT of the chest, abdomen, and pelvis. FINDINGS: Alignment: Retrolisthesis of L5 on S1 Vertebrae: No acute fractures. Vertebral body heights maintained. Degenerative changes: No high-grade canal stenosis.   No evidence of acute fracture or traumatic malalignment of the thoracic or lumbar spine. Please reference the CT of the chest, abdomen, and pelvis for characterization of all structures outside the thoracic and lumbar spine.  XR Pelvis 1-2 Views Result Date: 02/01/2024 X-RAY PELVIS (1-2 VIEWS), 02/01/2024 11:48 AM INDICATION: Trauma COMPARISON: None.   1.  No acute fracture or traumatic malalignment. 2.  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 3.  Contrast fills the bladder obscures evaluation of the sacrum. 4.  Moderate degenerative changes of the left hip.  CT Chest Abdomen Pelvis  W Contrast Result Date: 02/01/2024 CT CHEST ABDOMEN PELVIS W CONTRAST, 02/01/2024 12:03 PM INDICATION:  Trauma Abdomen/Pelvis COMPARISON: None. TECHNIQUE: CT images of the chest, abdomen,  and pelvis were obtained following intravenous administration of iodinated contrast. Conventional axial reconstructions and multiplanar reformatted images were submitted for review.  FINDINGS: . Thoracic inlet: Within normal limits. . Chest wall/Axilla: Within normal limits. . Central airways: Patent. . Mediastinum/Hila: Unremarkable. SABRA Heart: No pericardial effusion. Moderate aortic valve calcifications. . Vessels: Aorta normal in caliber. Scattered calcific atherosclerotic changes along the arch. No central pulmonary embolism. . Lungs/Pleura: Mild interstitial pulmonary edema. Bibasilar dependent atelectasis. . Liver: No suspicious focal findings. . Gallbladder/Biliary: Unremarkable. SABRA Spleen: Unremarkable. . Pancreas: Unremarkable. . Adrenals: Unremarkable. . Kidneys: Unremarkable. . Peritoneum/Mesenteries/Extraperitoneum: No free air. Trace free fluid in the deep right pelvis. No pathologically enlarged lymph nodes. . Gastrointestinal tract: No evidence of obstruction. . Ureters: Unremarkable. . Bladder: Unremarkable. . Reproductive System: Marked prostatomegaly. . Vascular: Unremarkable. . Musculoskeletal: No acute displaced fractures. Multiple left-sided rib deformities, chronic. Polyarticular degenerative changes.  Abdominal wall soft tissues unremarkable.   1.  No evidence of acute traumatic findings within the chest, abdomen, or pelvis. 2.  Ancillary findings as above. Please see separately reported dedicated CT reformats of the thoracolumbar spine for additional assessment.  CT Angio Head And Neck Result Date: 02/01/2024 CT BRAIN WITH CONTRAST, CT ANGIOGRAPHY OF NECK AND HEAD, 02/01/2024 9:45 AM INDICATION: AMS possible fall COMPARISON: Contemporaneous CT head TECHNIQUE: Scout images were first obtained followed by a  small test bolus of contrast for CTA timing purposes. Thereafter, a full dose of iodinated contrast was administered intravenously by rapid injection, with further multi-slice axial sections acquired in the arterial phase from the aortic arch to the cranial vertex. Image post-processing was then performed using 3D techniques (for example, MIP or 3D surface-rendered) to create reformatted images for comprehensive analysis and diagnosis of the intracranial circulation including the Circle of Willis. Any stenosis reported is calculated using the estimated diameter of the distal normal vessel (e.g., ICA) in the denominator. Please note, a noncontrast CT head was not performed as part of this examination given close proximity in time from earlier same day dedicated noncontrast CT head. All CT scans at [facility] and [facility] Imaging are performed using radiation dose optimization techniques as appropriate to a performed exam, including but not limited to one or more of the following: automatic exposure control, adjustment of the mA and/or kV according to patient size, use of iterative reconstruction technique. In addition, our institution participates in a radiation dose monitoring program to optimize patient radiation exposure. FINDINGS: CT HEAD: Calvarium/skull base: Redemonstrated acute, nondisplaced fracture involving the right parietal and occipital bones extending to the mastoid portion of the right temporal bone with partial opacification of the right middle ear and mastoid air cells, likely reflecting fluid/hemorrhage. Refer to CT face for additional findings. Brain: No acute large vascular territory infarct. Similar appearance of acute multifocal, multicompartmental traumatic intracranial hemorrhage including hemorrhagic parenchymal contusions in the left temporal and left greater than right frontal lobes. No progressive mass effect or ventricular enlargement. CTA NECK:  Aortic  arch: No significant stenosis (50% or greater) of the visualized origins of the major branch vessels. Left carotid system: No occlusion or significant stenosis (50% or greater). Mild retropharyngeal course of the left cervical internal carotid artery Right carotid system: No occlusion or significant stenosis (50% or greater). Vertebral arteries: Codominant. Mild to moderate stenosis at the origin of the left vertebral artery. CTA HEAD: Anterior circulation: No occlusion or significant stenosis (50% or greater). Mild multifocal narrowing of the anterior and middle cerebral arteries. No  aneurysm. Posterior circulation: No occlusion or significant stenosis (50% or greater). No aneurysm. Mild narrowing of the intradural left vertebral artery. Mild multifocal narrowing of the bilateral PCAs. Venous structures: Opacified intracranial venous structures appear patent.   No acute arterial abnormality in the head or neck. No CTA spot sign to suggest impending/active hemorrhage. Please refer to the contemporaneous noncontrast CT head and CT face for additional findings.  CT Facial Bones WO Contrast Result Date: 02/01/2024 CT FACE/SINUSES WITHOUT CONTRAST, 02/01/2024 9:29 AM INDICATION: fall? significant brusiing to head and neck but unclear trauma, on a blood thinner, patient is confused COMPARISON: None TECHNIQUE: High-resolution axial and/or coronal CT images of the maxillofacial region were obtained without contrast, using 2D reformations as needed. Portions of the brain, skull base, and upper neck were also included in the imaging field. All CT scans at [facility] and [facility] Imaging are performed using radiation dose optimization techniques as appropriate to a performed exam, including but not limited to one or more of the following: automatic exposure control, adjustment of the mA and/or kV according to patient size, use of iterative reconstruction technique. In addition, our  institution participates in a radiation dose monitoring program to optimize patient radiation exposure. FINDINGS: Superficial soft tissues: Partially imaged right posterior scalp contusion. Periorbital contusions. Orbits: Acute nondisplaced fracture of the right superior orbital rim, better seen on the contemporaneous CT head. Mild bilateral extraconal gas superior to the superior muscle complex with a right extraconal subperiosteal hematoma along the right superior orbital rim. Mild associated mass effect. No convincing retrobulbar hemorrhage. Lens replacements. Globes are bilaterally symmetric in size and contour. Sinuses: Mild mucosal thickening of the right maxillary sinus and paranasal sinus opacification with a fluid level in the right maxillary sinus and right sphenoid sinuses with possible hyperattenuating contents. Slight asymmetric buckling of the posterior wall the right maxillary sinus without a discrete fracture lucency. Midface/nasal cavity: No acute fracture. Mandible: No acute fracture. Refer to the CT head for intracranial findings. Acute nondisplaced right skull base fracture further described on the CT head. Post traumatic gas dissecting into the right paravertebral space secondary to fracture of the right sphenoid bone.   1.  Acute nondisplaced fracture of the right superior orbital rim, better seen on the contemporaneous CT head. Small right subperiosteal extraconal hematoma just inferior to the right inferior orbital rim with with mild associated mass effect. 2.  Small amount of gas just inferior to the left superior orbital rim raise the possibility of an occult fracture. No pneumocephalus. 3.  Acute nondisplaced right skull base fracture extending into the right occipital and parietal calvarium further described on the CT head, though which appears to extend into the right carotid canal. Attention to forthcoming CTA. 4.  Slight buckling of the posterior wall of the right maxillary sinus  with air-fluid level and possible hemosinus without a discrete fracture plane.  CT Spine Cervical WO Contrast Result Date: 02/01/2024 CT CERVICAL SPINE WITHOUT CONTRAST, 02/01/2024 9:29 AM INDICATION: fall? significant brusiing to head and neck but unclear trauma, on a blood thinner, patient is confused COMPARISON: None TECHNIQUE: Thin-section axial CT images of the entire cervical spine were acquired without contrast. Supplemental 2D reformatted images were generated and reviewed as needed. All CT scans at [facility] and [facility] Imaging are performed using radiation dose optimization techniques as appropriate to a performed exam, including but not limited to one or more of the following: automatic exposure control,  adjustment of the mA and/or kV according to patient size, use of iterative reconstruction technique. In addition, our institution participates in a radiation dose monitoring program to optimize patient radiation exposure. LEVELS IMAGED: Foramen magnum to upper thoracic region. FINDINGS: Alignment: No acute traumatic malalignment. Trace anterolisthesis of C6 on C7, C7 on T1, T1 and T2, likely degenerative. Craniocervical junction: No evidence of acute fracture or dislocation. Vertebrae: No acute fractures. Vertebral body heights maintained. Fusion of the right C2-C3 facets. Degenerative changes: No high-grade canal stenosis. Severe medial atlantodental changes. Multilevel facet arthropathy and uncovertebral joint spurring contribute to varying degrees of foraminal stenosis, moderate on the right at C4-C5 and on the left at C3-C4. Partially imaged right occipital and suboccipital scalp hematoma.   1.  No evidence of acute fracture or traumatic malalignment of the cervical spine. 2.  Please refer to the CT head and CT face for additional findings.  CT Head WO Contrast W Quant CT Tiss Character When Performed Result Date: 02/01/2024 CT HEAD WITHOUT CONTRAST, 02/01/2024  9:29 AM INDICATION: fall? significant brusiing to head and neck but unclear trauma, on a blood thinner, patient is confused COMPARISON: None TECHNIQUE: Axial CT images of the brain from skull base to vertex, including portions of the face and sinuses, were obtained without contrast. Supplemental 2D reformatted images were generated and reviewed as needed. All CT scans at [facility] and [facility] Imaging are performed using radiation dose optimization techniques as appropriate to a performed exam, including but not limited to one or more of the following: automatic exposure control, adjustment of the mA and/or kV according to patient size, use of iterative reconstruction technique. In addition, our institution participates in a radiation dose monitoring program to optimize patient radiation exposure. FINDINGS: Calvarium/skull base: Acute nondisplaced fracture coursing inferiorly from the right mastoid temporal bone along the spine of the sphenoid bone superiorly along the occipitomastoid suture and into the right occipital and parietal bones. Right middle ear mastoid opacification. Gas dissecting along the temporomandibular joint and parapharyngeal space. Right posterior scalp hematoma. Recommend attention to CT face for additional findings. Lens replacements. Paranasal sinuses: Mild mucosal thickening of the right maxillary sinus with a fluid level. Attention to CT face. Brain: Acute multicompartmental hemorrhage with moderately sized left inferior frontal lobe and left inferior temporal lobe intraparenchymal contusions with local adjacent mass effect and edema. Smaller right inferior frontal lobe intraparenchymal hemorrhagic contusions.  Acute moderate volume acute extra-axial bifrontal hemorrhage including a mixture of subarachnoid and subdural hematomas up to measuring 7 mm. Additional bilateral cerebral convexity subdural hematomas more posteriorly along the frontoparietal  convexities measuring 6 mm on the right and 4 mm on the left as well as a thin subdural along the falx. Small acute intraventricular hemorrhage layering in the bilateral occipital horns. No hydrocephalus or acute large vascular territory infarct. Mild generalized parenchymal volume loss with ex vacuo dilation of the ventricular system. No overt hydrocephalus   1.  Acute multicompartmental hemorrhage as detailed above with moderately sized left inferior frontal and left inferior temporal lobe intraparenchymal contusions with local mass effect and moderate adjacent edema. Small volume intraventricular hemorrhage without overt hydrocephalus in the absence of prior imaging for comparison. No midline shift or herniation. 2.  Acute nondisplaced fracture coursing inferiorly from the right mastoid temporal bone along coursing superiorly along the occipitomastoid suture and into the right occipital and parietal bones. 3.  Attention to CT face for additional findings. Case discussed with Dr. Faustino by  Dr. Rico via phone at 02/01/2024 10:17 AM.   ECG 12 lead Result Date: 02/01/2024 Ventricular Rate                   72        BPM                 QRS Duration                       90        ms                  Q-T Interval                       406       ms                  QTC Calculation Bazett             444       ms                  Calculated R Axis                  -19       degrees             Calculated T Axis                  21        degrees             Atrial fibrillation No previous ECGs available Confirmed by Rojelio Dunnings  [phone]  on 02-01-2024 9 29 11  AM   _____________________________________________________________________________ Discharge Instructions:   Discharge Orders     Ambulatory referral to Neurology     Details:    Reason for Referral: General Neurology   Ambulatory referral to PCP     Lifting Limits:     Details:    Lifting Limits: No lifting limits   Ambulatory referral to  Neurosurgery     Details:    Reason for Referral: Stroke        Future Appointments  Date Time Provider Department Center  03/04/2024  7:55 AM [facility] CT3 [facility]  03/04/2024 10:00 AM [place] Mallie Milroy, PA-C [facility] [place]

## 2024-02-24 NOTE — H&P (Signed)
 PHYSICAL MEDICINE AND REHABILITATION       HISTORY AND PHYSICAL PATIENT NAME:  Jose Higgins PATIENT MRN #:  [id number] PATIENT HAR #:  [id number] DATE OF BIRTH:  [birth year] DATE:  02/24/2024  TIME:  3:49 PM Date of Rehab Admit: 02/24/2024 Attending Provider: Lynwood Gladis Shuck, MD                           Primary Care Physician: Ryan JONETTA Hives, MD  Impairment: 02.22   Closed Injury Brain Etiologic Diagnosis Code/Description: S06.5X0A traumatic subdural hemorrhage without LOC, initial encounter Date of Onset: 02/01/24    Chief Complaint:  confusion  History of Present Illness:   Jose Higgins is an 84 year old male with a history of atrial fibrillation, hypertension, type 2 diabetes mellitus, hyperlipidemia, and benign prostatic hyperplasia who was admitted on 02/09/2024 for altered mental status following a ground-level fall on 02/01/2024. This resulted in bilateral frontal lobe contusions, subdural hematomas, left temporal lobe contusion, intraventricular hemorrhage, and a nondisplaced right mastoid temporal bone fracture.    On admission, he was noted to be increasingly lethargic and confused, with a serum sodium of 128 and complaints of headache. Head CT on 02/09/2024 demonstrated a slight increase in the size of the left frontal lobe intraparenchymal hematoma with worsening vasogenic edema and mild mass effect, including a 3-5 mm midline shift; other hemorrhages were stable. Neurosurgery was consulted and recommended close neurological monitoring, correction of hyponatremia, and holding anticoagulation and antiplatelet agents, with no acute surgical intervention indicated. He was admitted to the ICU for hourly neuro checks and correction of hyponatremia with 3% saline infusion and intermittent boluses, resulting in improvement of serum sodium to 144 and resolution of hyponatremia. Serial CT imaging showed stable findings with no new hemorrhage or expansion of contusions, and neurosurgery  continued to recommend conservative management and long-term discontinuation of anticoagulation due to fall risk and gait unsteadiness.    During the admission, his mental status remained delirious but improved with time; he was intermittently confused and somnolent but able to follow simple commands. Agitation and impulsivity required intermittent use of restraints and pharmacologic management with Precedex, Haldol, olanzapine, and Seroquel for safety, with Haldol discontinued prior to discharge. 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 He experienced persistent agitation and impulsivity, requiring close supervision and intermittent use of restraints, particularly during episodes of urinary retention and after foley catheter placement; the foley catheter was ultimately removed on 1/23, and he was able to void without further retention.    Blood pressure was managed with metoprolol  and PRN labetalol and hydralazine, with amlodipine added for further control; serum potassium and magnesium  were repleted as needed. His diabetes was managed with a consistent carbohydrate diet and subcutaneous insulin , with oral agents held.     Speech-language pathology evaluation identified dysphagia with significant aspiration risk; a modified barium swallow on 1/20 revealed mild oral and moderate pharyngeal dysphagia, with recommendations for pureed diet and thin liquids, strict aspiration precautions, and consideration of alternative nutrition if intake remained poor. After shared decision-making with the family, PEG tube placement was deferred due to concerns for inadvertent line removal and family preference. Nutrition consult noted poor oral intake and significant weight loss, with Ensure Plus supplementation and monitoring for malnutrition.        By discharge, his principal problems of altered mental status and hyponatremia had  resolved, though  he remained intermittently confused and somnolent.  Eliquis  was discontinued per neurosurgery due to recurrent intracranial hemorrhage and high fall risk. Metoprolol  was switched to tartrate 25 mg BID, and amlodipine 5 mg daily was started for blood pressure control. Jardiance-metformin was held due to acute illness and inconsistent PO intake.      Additional issues addressed during the admission included constipation, which was managed with Miralax , and ongoing monitoring for skin integrity, fall risk, and nutritional status. He required assistance with all activities of daily living and was at high risk for falls, necessitating close supervision and fall precautions throughout his hospitalization and at discharge.      PAST MEDICAL HISTORY: Medical History[1]   PAST SURGICAL HISTORY: Surgical History[2]    FAMILY HISTORY:  Family History[3]  SOCIAL HISTORY: Social History   Socioeconomic History   Marital status: Married    Spouse name: Not on file   Number of children: Not on file   Years of education: Not on file   Highest education level: Not on file  Occupational History   Not on file  Tobacco Use   Smoking status: Never   Smokeless tobacco: Never  Substance and Sexual Activity   Alcohol use: Not Currently   Drug use: Never   Sexual activity: Not on file    Comment: married  Other Topics Concern   Not on file  Social History Narrative   Not on file   Social Drivers of Health   Living Situation: Unknown (02/10/2024)   Living Situation    What is your living situation today?: Patient unable to answer    Think about the place you live. Do you have problems with any of the following? Choose all that apply:: Patient unable to answer  Food Insecurity: Unknown (02/10/2024)   Food vital sign    Within the past 12 months, you worried that your food would run out before you got money to buy more: Patient unable to answer    Within the past 12  months, the food you bought just didn't last and you didn't have money to get more: Patient unable to answer  Transportation Needs: Unknown (02/10/2024)   Transportation    In the past 12 months, has lack of reliable transportation kept you from medical appointments, meetings, work or from getting things needed for daily living? : Patient unable to answer  Utilities: Unknown (02/10/2024)   Utilities    In the past 12 months has the electric, gas, oil, or water company threatened to shut off services in your home? : Patient unable to answer  Safety: Unknown (02/10/2024)   Safety    How often does anyone, including family and friends, physically hurt you?: Patient unable to answer    How often does anyone, including family and friends, insult or talk down to you?: Patient unable to answer    How often does anyone, including family and friends, threaten you with harm?: Patient unable to answer    How often does anyone, including family and friends, scream or curse at you?: Patient unable to answer  Alcohol Screening: Not on file  Tobacco Use: Low Risk (02/01/2024)   Patient History    Smoking Tobacco Use: Never    Smokeless Tobacco Use: Never    Passive Exposure: Not on file  Depression: Not at risk (01/09/2022)   [facility] from [facility] visits prior to 03/29/2022.   PHQ-2    SDOH PHQ2 SCORE: 0  Social Connections: Unknown (02/10/2024)  Social Connection and Isolation Panel    Frequency of Communication with Friends and Family: Patient unable to answer    Frequency of Social Gatherings with Friends and Family: Not on file    Attends Religious Services: Not on file    Active Member of Clubs or Organizations: Not on file    Attends Banker Meetings: Not on file    Marital Status: Not on file  Financial Resource Strain: Patient Unable To Answer (02/10/2024)   Overall Financial Resource Strain (CARDIA)    Difficulty of Paying Living Expenses:  Patient unable to answer    ALLERGIES: Allergies[4]   ACTIVE MEDICATIONS: SCHEDULED  [START ON 02/25/2024] amLODIPine (NORVASC) tablet 5 mg  5 mg oral Daily   atorvastatin  (LIPITOR) tablet 20 mg  20 mg oral Nightly   insulin  lispro (HumaLOG) injection 0-12 Units  0-12 Units subcutaneous TID PC   metoprolol  tartrate (LOPRESSOR ) tablet 12.5 mg  12.5 mg oral Q12H SCH   QUEtiapine (SEROquel) tablet 50 mg  50 mg oral At Bedtime   [START ON 02/25/2024] tamsulosin (FLOMAX) 24 hr capsule 0.4 mg  0.4 mg oral Daily     PRN: acetaminophen , 650 mg, Q6H PRN albuterol , 2.5 mg, Q6H PRN cloNIDine, 0.1 mg, Q8H PRN dextrose , 12.5 g, PRN dextrose , 15 g, PRN nitroglycerin, 0.4 mg, Q5 Min PRN ondansetron , 4 mg, Q6H PRN     Review of Systems:   Constitutional symptoms:  fatigue Eyes:  negative Ear, nose, throat: negative Cardiovascular: denies chest pain Respiratory: denies shortness of breath  Gastrointestinal:  negative Genitourinary:  negative Skin:  negative Neurological:  weakness Musculoskeletal:  negative The remainder of the ROS are negative.   PHYSICAL EXAM > Temp:  [97.5 F (36.4 C)-98.3 F (36.8 C)] 97.5 F (36.4 C) Heart Rate:  [81-107] 107 Resp:  [16-19] 18 BP: (77-127)/(50-76) 127/76 >   >   General Appearance:  NAD, cooperative, pleasant, follows commands  Eyes: PERRL, EOMI, nonicteric, no nystagmus, no discharge, no conjunctivitis  HENT: [place]/ AT MM clear, moist without lesions or exudate  Neck: Supple, without TM, LA    Respiratory: CTAB, normal work of breathing   Cardiovascular: Regular Rhythm, nl S1, S2 and normal rate  Distal pulses present and 2+  Bilat LE edema not present.     Gastrointestinal: BS +, soft, NT, ND   Neurologic: Awake/Alert Ox4, Cooperative, follows commands, speech clear CN: PERRL, EOMI  Visual fields grossly intact, facial sensation intact Face symmetrical, hearing , uvula midline, palate elevates symmetrically, tongue protrudes  midline. Sensation intact to light touch    Motor: strength 3/5 Normal bulk and tone  Coord:  Grossly normal   Musculoskeletal: Normal alignment BUE and BLEs without significant deformity  Functional ROM without pain   Skin: Warm and dry, No rashes    Lines, Drains, Wounds Peripheral IV 02/24/24 20 G Posterior;Right Forearm (Active)  Placement Date/Time: 02/24/24 1000   Size (Gauge): 20 G  Orientation: Posterior;Right  Location: Forearm  Inserted by: Duwaine Blush, RN  Insertion attempts: 1  Patient Tolerance: Tolerated well    Laboratory and Radiological  I have reviewed all pertinent diagnostic studies and laboratory values: Yes   Recent Labs    02/24/24 1007  WBC 7.97  HGB 14.6  HCT 41.6  PLT 193  NA 135*  K 3.8  CL 101  CO2 24  BUN 18  CREATININE 0.89  CALCIUM  9.7  MG 1.7*   Lab Results  Component Value Date   WBC 7.97  02/24/2024   HGB 14.6 02/24/2024   HCT 41.6 02/24/2024   PLT 193 02/24/2024   ALT 17 02/09/2024   AST 15 02/09/2024   NA 135 (L) 02/24/2024   K 3.8 02/24/2024   CL 101 02/24/2024   CREATININE 0.89 02/24/2024   BUN 18 02/24/2024   CO2 24 02/24/2024   TSH 0.921 02/09/2024   INR 1.3 02/09/2024   HGBA1C 7.0 (H) 02/09/2024    Lab Results  Component Value Date   HGBA1C 7.0 (H) 02/09/2024   Lab Results  Component Value Date   HGB 14.6 02/24/2024   HGB 15.4 02/19/2024   HGB 14.9 02/18/2024   HGB 14.1 02/17/2024   HGB 14.3 02/16/2024   Lab Results  Component Value Date   MG 1.7 (L) 02/24/2024   MG 1.9 02/19/2024   MG 1.9 02/18/2024   MG 1.9 02/17/2024   MG 1.9 02/16/2024   Lab Results  Component Value Date   K 3.8 02/24/2024   K 4.1 02/19/2024   K 4.6 (H) 02/18/2024   K 4.9 (H) 02/17/2024   K 4.0 02/16/2024   Lab Results  Component Value Date   WBC 7.97 02/24/2024   WBC 9.50 02/19/2024   WBC 8.00 02/18/2024   WBC 7.92 02/17/2024   WBC 7.13 02/16/2024   Lab Results  Component Value Date   COLORU Yellow 02/09/2024    GLUCOSEU >1000 (A) 02/09/2024   BILIRUBINUR Negative 02/09/2024   BLOODU Negative 02/09/2024   UROBILINOGEN Normal 02/09/2024   NITRITE Negative 02/09/2024   RBCU 0-2 02/01/2024   WBCU 0-5 02/01/2024    Lab Results  Component Value Date   WBC 7.97 02/24/2024   HGB 14.6 02/24/2024   HCT 41.6 02/24/2024   MCV 88.1 02/24/2024   PLT 193 02/24/2024   Lab Results  Component Value Date   GLUCOSE 172 (H) 02/24/2024   CALCIUM  9.7 02/24/2024   NA 135 (L) 02/24/2024   K 3.8 02/24/2024   CO2 24 02/24/2024   CL 101 02/24/2024   BUN 18 02/24/2024   CREATININE 0.89 02/24/2024   Lab Results  Component Value Date   ALT 17 02/09/2024   AST 15 02/09/2024   GGT 11 02/01/2024   BILITOT 1.8 (H) 02/09/2024   Lab Results  Component Value Date   INR 1.3 02/09/2024   INR 1.0 02/01/2024   INR 1.3 02/01/2024   PROTIME 16.0 (H) 02/09/2024   PROTIME 11.4 02/01/2024   PROTIME 16.5 (H) 02/01/2024    Micro: No results found for this visit on 02/24/24 (from the past week).  Imaging: Images reviewed by me:  Yes CT Head WO Contrast W Quant CT Tiss Character When Performed Result Date: 02/24/2024 CT HEAD WITHOUT CONTRAST, 02/24/2024 12:44 PM INDICATION: ams COMPARISON: CT head 02/17/2024. TECHNIQUE: Axial CT images of the brain from skull base to vertex, including portions of the face and sinuses, were obtained without contrast. Supplemental 2D reformatted images were generated and reviewed as needed. All CT scans at [facility] and [facility] are performed using radiation dose optimization techniques as appropriate to a performed exam, including but not limited to one or more of the following: automatic exposure control, adjustment of the mA and/or kV according to patient size, use of iterative reconstruction technique. In addition, our institution participates in a radiation dose monitoring program to optimize patient radiation exposure. FINDINGS: Calvarium/skull  base: The propagates through the right temporal bone with persistent fluid/hemorrhage in right mastoid air  cells and middle ear. Paranasal sinuses: Interval resolution of previous mucosal thickening and aerated secretions in the sphenoid sinuses and right frontal sinus. Brain: Persistent but improved edema in the anterior left temporal lobe with decreased conspicuity of prior hemorrhagic contusion. There is also persistent but decreased edema associated with prior left frontal lobe hemorrhagic contusion, also with decreasing conspicuity of blood products. Developing encephalomalacia and gliosis within the right anterior frontal lobe. Previous multifocal subarachnoid hemorrhage and subdural hemorrhage have resolved. No new or progressive mass effect. No specific evidence for an acute large vessel territory infarction. Similar brain parenchymal volume loss with ex vacuo enlargement of the ventricular system. No hydrocephalus. No new site of intracranial hemorrhage. Intracranial atherosclerosis.   1.  No acute intracranial abnormality. Consider MRI for further evaluation if there is ongoing clinical concern. 2.  Persistent but improved edema associated with prior hemorrhagic contusions in the left frontal and left temporal lobes. 3.  Developing encephalomalacia and gliosis in the right anterior frontal lobe. 4.  Resolution of previous multifocal subarachnoid hemorrhage and subdural hemorrhage.   XR Spine Cervical 2-3 Views Result Date: 02/20/2024 Date of Service: 2024-02-20 12:21:00 RADIOGRAPHS OF THE CERVICAL SPINE 2 OR 3 VIEWS INDICATION:  Neck pain. COMPARISON:  None. FINDINGS: Vertebrae:  Multilevel facet arthropathy.  No definite fracture.  Normal alignment. Disc spaces:  No acute findings.  No significant narrowing. Soft tissues:  Unremarkable.   1. Multilevel facet arthropathy. Electronically Signed by: Toribio Lennie BRUNS. on 02/20/2024 13:28:00 US [place] on workstation ID: [id number]  CT Head WO Contrast W  Quant CT Tiss Character When Performed Result Date: 02/17/2024 CT HEAD WITHOUT CONTRAST, 02/17/2024 3:18 PM INDICATION: R/O bleed COMPARISON: Multiple prior CT and MRI studies most recently dated 02/13/2024. TECHNIQUE: Axial CT images of the brain from skull base to vertex, including portions of the face and sinuses, were obtained without contrast. Supplemental 2D reformatted images were generated and reviewed as needed. All CT scans at [facility] and [facility] are performed using radiation dose optimization techniques as appropriate to a performed exam, including but not limited to one or more of the following: automatic exposure control, adjustment of the mA and/or kV according to patient size, use of iterative reconstruction technique. In addition, our institution participates in a radiation dose monitoring program to optimize patient radiation exposure. FINDINGS: Multicompartment intracranial hemorrhage with sites of decreasing density of parenchymal hemorrhagic contusions, subarachnoid hemorrhage, and subdural hemorrhage primarily along the left tentorium and occipital convexity. There are residual hyperattenuating blood products primarily along the posterior falx, tentorium, and within the subarachnoid space. No evidence of new or increasing intracranial hemorrhage. Similar edema associated with parenchymal contusions in the frontal lobes and left anterior temporal lobe with similar mass effect deforming adjacent structures and resulting in a similar degree of rightward displacement of the paramedian left frontal lobe and anterior septum pellucidum. No progressive mass effect. No progressive ventricular enlargement. Similar alignment to the right parieto-occipital calvarial fracture propagating into the right temporal bone and skull base, as previously described. Nonspecific fluid and aerated secretions in the paranasal sinuses.   1.  Evolving multicompartment  intracranial hemorrhage with decreasing density of blood products. No evidence of new or increasing intracranial hemorrhage. 2.  Similar edema associated with multifocal parenchymal contusions without progressive mass effect. 3.  Similar caliber of the ventricular system without evidence of hydrocephalus.  FL Esophagus Barium Swallow With Video And Speech Result Date: 02/16/2024 [facility] ESOPHAGUS BARIUM SWALLOW WITH VIDEO AND SPEECH, 02/16/2024 2:48  PM INDICATION:  Dysphagia, recent fall with intracranial hemorrhage COMPARISON: None. EXAM: Patient refused to swallow barium, the exam was aborted.   Non diagnostic study. Aimee Han, PA-C was present in the fluoroscopy room during the exam under the supervision of Gordy Roulette, M.D.   [facility] Chest 1 View Result Date: 02/15/2024 Date of Service: 2024-02-14 13:08:00 EXAM: XR Chest, 1  View. CLINICAL HISTORY: cough, rule out aspiration or penumonia. COMPARISON: None provided.  FINDINGS: LUNGS AND PLEURA: The lungs are clear. No pleural effusion or pneumothorax. HEART AND MEDIASTINUM: The heart size and mediastinal contours are normal. BONES: No acute osseous abnormality.   No acute cardiopulmonary pathology. Electronically signed by: Leni Potash, MD on 02/15/2024 06:00:01 PM [facility] [place]  MRI Brain WO Contrast Result Date: 02/14/2024 Date of Service: 2024-02-13 11:50:00 EXAM: MR Brain Without Intravenous Contrast. CLINICAL HISTORY: AMS, recent hx of intraparynchymal hemorrhage. TECHNIQUE: Magnetic resonance images of the brain without intravenous contrast in multiple planes. CONTRAST: Without COMPARISON: CT dated 02/11/2024 FINDINGS: BRAIN: There persistent hemorrhagic contusions in the frontal regions bilaterally, markedly more severe on the left. Extra-axial hemorrhagic changes in the subdural and subarachnoid spaces are seen over the frontal lobes bilaterally, more so on the left. Edema surrounds these areas with attenuation of the anterior horn of the left lateral  ventricle.There appear to be subdural hemorrhages along the inferior posterior aspect of the temporal and occipital lobes bilaterally with some involvement of the tentorium. Trace subarachnoid hemorrhages seen in the sylvian fissures. The brainstem and posterior fossa are essentially unremarkable. No significant midline shift.No cerebellar tonsillar ectopia. The central arterial and venous flow voids are patent. VENTRICLES: No hydrocephalus. ORBITS: The orbits are normal. SINUSES AND MASTOIDS: There is fluid in the right mastoids and middle ear cavity. Scattered mucosal thickening is seen throughout the paranasal sinuses. Theleft mastoid air cells are clear. BONES: No focal osseous lesion.   Allowing for difference in technique relatively stable suspected posttraumatic findings as noted Electronically signed by: Marinda Silversmith, MD on 02/14/2024 09:10:52 AM [facility] [place]  CT Head WO Contrast W Quant CT Tiss Character When Performed Result Date: 02/11/2024 CT HEAD WITHOUT CONTRAST, 02/11/2024 9:22 AM INDICATION: Mental Status Change, Injury, unspecified, initial encounter \ T14.90XA Injury, unspecified, initial encounter COMPARISON: CT head of 02/09/2024 TECHNIQUE: Axial CT images of the brain from skull base to vertex, including portions of the face and sinuses, were obtained without contrast. Supplemental 2D reformatted images were generated and reviewed as needed. All CT scans at [facility] and [facility] Imaging are performed using radiation dose optimization techniques as appropriate to a performed exam, including but not limited to one or more of the following: automatic exposure control, adjustment of the mA and/or kV according to patient size, use of iterative reconstruction technique. In addition, our institution participates in a radiation dose monitoring program to optimize patient radiation exposure. FINDINGS: Calvarium/skull base: Similar right eccentric occipital and  temporal fracture, described in further detail on prior exams. Similar partial opacification of the right mastoid air cells. Paranasal sinuses: Similar air-fluid levels in the right frontal and sphenoid sinuses. Scattered mucosal thickening. Brain: Similar left anterior frontal lobe intraparenchymal hematoma with associated perilesional edema and local mass effect. Similar additional bifrontal and left temporal intraparenchymal hematomas/hemorrhagic contusions. Similar scattered small volume subarachnoid hemorrhage within the interhemispheric fissure and in multiple sulci overlying bilateral frontal, bilateral parietal, bilateral temporal, and left occipital lobes. Slightly increased trace hemorrhage layering in bilateral occipital horns of the lateral ventricles. Similar thin subdural hematoma  along the posterior falx and bilateral tentorial leaflets. Similar trace rightward midline shift. Similar size and morphology of the ventricular system without evidence of hydrocephalus.   1.  Slightly increased trace intraventricular hemorrhage without significant change in size of the ventricular system. 2.  Otherwise, similar multicompartment intracranial hemorrhage and mass effect, further detailed above.  ECG 12 lead Result Date: 02/10/2024 Ventricular Rate                   70        BPM                 QRS Duration                       86        ms                  Q-T Interval                       426       ms                  QTC Calculation Bazett             460       ms                  Calculated R Axis                  -33       degrees             Calculated T Axis                  137       degrees             Atrial fibrillation LVH Nonspecific T wave changes Prolonged QT interval, consider electrolyte imbalance or drug effects When compared with ECG of 01-Feb-2024 09 02, No significant change Confirmed by Rojelio Dunnings  [phone]  on 02-10-2024 5 40 22 AM  XR Chest 1 View Result Date: 02/09/2024 Date of  Service: 2024-02-09 19:53:00 EXAM: XR Chest, 1 View. CLINICAL HISTORY: Generalized Weakness. COMPARISON: 02/02/24. FINDINGS: LUNGS AND PLEURA: The lungs are clear. No pleural effusion or pneumothorax. HEART AND MEDIASTINUM: The heart size and mediastinal contours are normal. BONES: No acute osseous abnormality.   No acute cardiopulmonary pathology. Electronically signed by: Christin Curb, MD on 02/09/2024 09:53:02 PM [facility] [place]  CT Head WO Contrast W Quant CT Tiss Character When Performed Addendum Date: 02/09/2024 Date of Service: 2024-02-09 20:24:00 -----ADDENDUM-----: Receipt of this report by the clinical staff was confirmed with Leita Phenix, MD by Nathanael Beckwith on Feb 09, 2024 21:29:00 EST. Electronically signed by: Jantovsky, Madalyne on 02/09/2024 09:29:27 PM [facility] -----ORIGINAL REPORT-----: EXAM: CT Head Without Intravenous Contrast. CLINICAL HISTORY: Recent ICH, AMS. TECHNIQUE: Axial computed tomography images of the head/brain without intravenous contrast. COMPARISON: 02/02/24. FINDINGS: BRAIN: Previously noted left frontal lobe intraparenchymal hematomas have slightly increased in size currently measuring up to 2.4 x 2.3 cm with worsening vasogenic edema noted.  Mild increased mass-effect seen in the left lateral ventricle with approximately 0.5 cm left to right midline shift along the anterior left frontal lobe.  Close continued follow-up recommended. Stable subdural hemorrhage is seen along the bilateral frontal lobes and along the bilateral parietal convexities.  Stable scattered subarachnoid hemorrhage noted.  Stable intraventricular  hemorrhage. SOFT TISSUES: No significant facial or scalp soft tissue swelling evident. No radiopaque foreign body is seen. BONES: No acute skull fracture. IMPRESSION: Previously noted left frontal lobe intraparenchymal hematomas have slightly increased in size currently measuring up to 2.4 x 2.3 cm with worsening vasogenic edema noted.  Mild increased mass-effect  seen in the left lateral ventricle with approximately 0.5 cm left to right midline shift along the anterior left frontal lobe.  Close continued follow-up recommended. Remaining intraparenchymal hemorrhages are grossly stable. Electronically signed by: Christin Curb, MD on 02/09/2024 09:24:58 PM [facility] Per PQRS, all CT exams are performed using one or more of the following dose reduction techniques: automated exposure control, adjustment of the mA and/or kV according to patient size, or use of iterative reconstruction technique.   Result Date: 02/09/2024 Date of Service: 2024-02-09 20:24:00 EXAM: CT Head Without Intravenous Contrast. CLINICAL HISTORY: Recent ICH, AMS. TECHNIQUE: Axial computed tomography images of the head/brain without intravenous contrast. COMPARISON: 02/02/24. FINDINGS: BRAIN: Previously noted left frontal lobe intraparenchymal hematomas have slightly increased in size currently measuring up to 2.4 x 2.3 cm with worsening vasogenic edema noted.  Mild increased mass-effect seen in the left lateral ventricle with approximately 0.5 cm left to right midline shift along the anterior left frontal lobe.  Close continued follow-up recommended. Stable subdural hemorrhage is seen along the bilateral frontal lobes and along the bilateral parietal convexities.  Stable scattered subarachnoid hemorrhage noted.  Stable intraventricular hemorrhage. SOFT TISSUES: No significant facial or scalp soft tissue swelling evident. No radiopaque foreign body is seen. BONES: No acute skull fracture.   Previously noted left frontal lobe intraparenchymal hematomas have slightly increased in size currently measuring up to 2.4 x 2.3 cm with worsening vasogenic edema noted.  Mild increased mass-effect seen in the left lateral ventricle with approximately 0.5 cm left to right midline shift along the anterior left frontal lobe.  Close continued follow-up recommended. Remaining intraparenchymal hemorrhages are grossly stable.  Electronically signed by: Christin Curb, MD on 02/09/2024 09:24:58 PM [facility] Per PQRS, all CT exams are performed using one or more of the following dose reduction techniques: automated exposure control, adjustment of the mA and/or kV according to patient size, or use of iterative reconstruction technique.   CT Head WO Contrast W Quant CT Tiss Character When Performed Result Date: 02/02/2024 CT HEAD WITHOUT CONTRAST, 02/02/2024 10:09 AM INDICATION: trauma COMPARISON: CT head 02/01/2024 TECHNIQUE: Axial CT images of the brain from skull base to vertex, including portions of the face and sinuses, were obtained without contrast. Supplemental 2D reformatted images were generated and reviewed as needed. All CT scans at [facility] and [facility] are performed using radiation dose optimization techniques as appropriate to a performed exam, including but not limited to one or more of the following: automatic exposure control, adjustment of the mA and/or kV according to patient size, use of iterative reconstruction technique. In addition, our institution participates in a radiation dose monitoring program to optimize patient radiation exposure. FINDINGS: Calvarium/skull base: Facial, calvarial, and skull base fractures as previously described. Paranasal sinuses: Similar scattered areas of paranasal sinus opacification. Brain: Similar multicompartment intracranial hemorrhages, including similar size of hemorrhagic contusions in the left greater than right frontal and temporal lobes. Similar volume of extra-axial blood products along the bilateral frontal lobes and subdural hemorrhage along the bilateral parietal convexities. Similar volume of scattered subarachnoid hemorrhage and small volume hemorrhage within the lateral ventricles. No progressive intracranial mass effect. Similar size and  configuration of the ventricular system.   1.  Similar multicompartment intracranial hemorrhage,  as described above. No increasing mass effect or hydrocephalus. 2.  No acute large vascular territory infarct. 3.  Similar calvarial, facial, and skull base fractures.  XR Chest 1 View Result Date: 02/02/2024 XR CHEST 1 VIEW, 02/02/2024 3:42 PM INDICATION:c/f pneumonia COMPARISON: Chest radiograph and CT CAP 02/01/2024 FINDINGS: Supportive devices: Telemetry leads overlie the chest. Cardiovascular/lungs/pleura: Similar cardiomediastinal silhouette. Thoracic aortic calcifications. Mildly increased interstitial opacities. No pleural effusions. No discernible pneumothorax. Other: Polyarticular degenerative changes.   Mild pulmonary interstitial edema.   CT Head WO Contrast W Quant CT Tiss Character When Performed Result Date: 02/02/2024 CT HEAD WITHOUT CONTRAST, 02/01/2024 6:28 PM INDICATION: trauma COMPARISON: CT head 02/01/2024. TECHNIQUE: Axial CT images of the brain from skull base to vertex, including portions of the face and sinuses, were obtained without contrast. Supplemental 2D reformatted images were generated and reviewed as needed. All CT scans at [facility] and [facility] are performed using radiation dose optimization techniques as appropriate to a performed exam, including but not limited to one or more of the following: automatic exposure control, adjustment of the mA and/or kV according to patient size, use of iterative reconstruction technique. In addition, our institution participates in a radiation dose monitoring program to optimize patient radiation exposure. FINDINGS: Calvarium/skull base: Redemonstrated a nondisplaced fracture coursing inferiorly from the right mastoid temporal bone along the spine of the sphenoid bone superiorly along the occipitomastoid suture and into the right occipital and parietal bones. Ongoing right middle ear and mastoid opacification. Gas dissecting along the temporomandibular joint and parapharyngeal space, similar to prior  imaging. Ongoing right posterior scalp hematoma. Please see CT face dated 02/01/2024 for evaluation of facial fractures, including nondisplaced fracture of the right superior orbital rim, right skull base, and possible fracture of the right maxillary sinus posterior wall. Lens replacements. Paranasal sinuses: Scattered opacification of the ethmoid and sphenoid air cells, which could represent secretions versus blood product. Brain: Similar extent of multi compartmental hemorrhage, including left inferior frontal lobe and left greater than right inferior temporal lobe intraparenchymal contusions with similar local mass effect and adjacent edema. Similar small right inferior frontal lobe intraparenchymal hemorrhagic contusion. Similar volume extra-axial blood product along bilateral frontal lobes with slight interval reduced patient. Similar volume of bilateral cerebral convexity subdural hematomas more posteriorly along bilateral frontal parietal convexities. Similar small volume intraventricular hemorrhage layering within bilateral occipital horns. Similar blood product layering along the falx, tentorial leaflets, and sylvian fissures. Similar size of the ventricular system. Trace pneumocephalus along the falx. No increasing mass effect. No large vascular territory infarction.   1.  Similar extent of multi-compartmental hemorrhage, as further described above. No increasing mass effect or hydrocephalus. 2.  No acute large vascular territory infarction. 3.  Redemonstrated calvarial, facial, and skull base fractures, as further described above.   XR Chest 1 View Result Date: 02/02/2024 XR CHEST 1 VIEW, 02/01/2024 11:48 AM INDICATION:Trauma COMPARISON: None FINDINGS: Supportive devices: Cervical collar. Telemetry leads overlie the chest. Cardiovascular/lungs/pleura: Cardiac silhouette and pulmonary vasculature are within normal limits. Thoracic aortic calcifications. Bilateral perihilar and paracardiac streaky  opacities. No pleural effusions. No discernible pneumothorax. Other: No acute displaced fractures. Polyarticular degenerative changes.   Bilateral perihilar and paracardiac streaky opacities, favoring pulmonary edema or atelectasis.   CT Spine Thoracic Lumbar Reformats W Contrast Result Date: 02/01/2024 CT THORACIC AND LUMBAR SPINE REFORMATS WITH CONTRAST, 02/01/2024 12:07 PM INDICATION: Mid-back pain, trauma  COMPARISON: None TECHNIQUE: Thin-section axial CT images of the entire thoracic and lumbar spine were acquired after intravenous administration of iodine-based contrast as part of a chest, abdomen, and pelvis CT examination. A request was made for the original data set to be reformatted in the coronal and sagittal planes using bone algorithm and reviewed as a screen for bony spinal pathology. All CT scans at [facility] and [facility] are performed using radiation dose optimization techniques as appropriate to a performed exam, including but not limited to one or more of the following: automatic exposure control, adjustment of the mA and/or kV according to patient size, use of iterative reconstruction technique. In addition, our institution participates in a radiation dose monitoring program to optimize patient radiation exposure. LEVELS IMAGED: Lower cervical region to upper sacrum. The sacrum is more completely evaluated on the CT of the chest, abdomen, and pelvis. FINDINGS: Alignment: Retrolisthesis of L5 on S1 Vertebrae: No acute fractures. Vertebral body heights maintained. Degenerative changes: No high-grade canal stenosis.   No evidence of acute fracture or traumatic malalignment of the thoracic or lumbar spine. Please reference the CT of the chest, abdomen, and pelvis for characterization of all structures outside the thoracic and lumbar spine.  XR Pelvis 1-2 Views Result Date: 02/01/2024 X-RAY PELVIS (1-2 VIEWS), 02/01/2024 11:48 AM INDICATION: Trauma  COMPARISON: None.   1.  No acute fracture or traumatic malalignment. 2.  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 3.  Contrast fills the bladder obscures evaluation of the sacrum. 4.  Moderate degenerative changes of the left hip.  CT Chest Abdomen Pelvis W Contrast Result Date: 02/01/2024 CT CHEST ABDOMEN PELVIS W CONTRAST, 02/01/2024 12:03 PM INDICATION:  Trauma Abdomen/Pelvis COMPARISON: None. TECHNIQUE: CT images of the chest, abdomen, and pelvis were obtained following intravenous administration of iodinated contrast. Conventional axial reconstructions and multiplanar reformatted images were submitted for review.  FINDINGS: . Thoracic inlet: Within normal limits. . Chest wall/Axilla: Within normal limits. . Central airways: Patent. . Mediastinum/Hila: Unremarkable. SABRA Heart: No pericardial effusion. Moderate aortic valve calcifications. . Vessels: Aorta normal in caliber. Scattered calcific atherosclerotic changes along the arch. No central pulmonary embolism. . Lungs/Pleura: Mild interstitial pulmonary edema. Bibasilar dependent atelectasis. . Liver: No suspicious focal findings. . Gallbladder/Biliary: Unremarkable. SABRA Spleen: Unremarkable. . Pancreas: Unremarkable. . Adrenals: Unremarkable. . Kidneys: Unremarkable. . Peritoneum/Mesenteries/Extraperitoneum: No free air. Trace free fluid in the deep right pelvis. No pathologically enlarged lymph nodes. . Gastrointestinal tract: No evidence of obstruction. . Ureters: Unremarkable. . Bladder: Unremarkable. . Reproductive System: Marked prostatomegaly. . Vascular: Unremarkable. . Musculoskeletal: No acute displaced fractures. Multiple left-sided rib deformities, chronic. Polyarticular degenerative changes.  Abdominal wall soft tissues unremarkable.   1.  No evidence of acute traumatic findings within the chest, abdomen, or pelvis. 2.  Ancillary findings as above. Please see separately reported dedicated CT reformats of the thoracolumbar  spine for additional assessment.  CT Angio Head And Neck Result Date: 02/01/2024 CT BRAIN WITH CONTRAST, CT ANGIOGRAPHY OF NECK AND HEAD, 02/01/2024 9:45 AM INDICATION: AMS possible fall COMPARISON: Contemporaneous CT head TECHNIQUE: Scout images were first obtained followed by a small test bolus of contrast for CTA timing purposes. Thereafter, a full dose of iodinated contrast was administered intravenously by rapid injection, with further multi-slice axial sections acquired in the arterial phase from the aortic arch to the cranial vertex. Image post-processing was then performed using 3D techniques (for example, MIP or 3D surface-rendered) to create reformatted images for comprehensive  analysis and diagnosis of the intracranial circulation including the Circle of Willis. Any stenosis reported is calculated using the estimated diameter of the distal normal vessel (e.g., ICA) in the denominator. Please note, a noncontrast CT head was not performed as part of this examination given close proximity in time from earlier same day dedicated noncontrast CT head. All CT scans at [facility] and [facility] are performed using radiation dose optimization techniques as appropriate to a performed exam, including but not limited to one or more of the following: automatic exposure control, adjustment of the mA and/or kV according to patient size, use of iterative reconstruction technique. In addition, our institution participates in a radiation dose monitoring program to optimize patient radiation exposure. FINDINGS: CT HEAD: Calvarium/skull base: Redemonstrated acute, nondisplaced fracture involving the right parietal and occipital bones extending to the mastoid portion of the right temporal bone with partial opacification of the right middle ear and mastoid air cells, likely reflecting fluid/hemorrhage. Refer to CT face for additional findings. Brain: No acute large vascular territory  infarct. Similar appearance of acute multifocal, multicompartmental traumatic intracranial hemorrhage including hemorrhagic parenchymal contusions in the left temporal and left greater than right frontal lobes. No progressive mass effect or ventricular enlargement. CTA NECK:  Aortic arch: No significant stenosis (50% or greater) of the visualized origins of the major branch vessels. Left carotid system: No occlusion or significant stenosis (50% or greater). Mild retropharyngeal course of the left cervical internal carotid artery Right carotid system: No occlusion or significant stenosis (50% or greater). Vertebral arteries: Codominant. Mild to moderate stenosis at the origin of the left vertebral artery. CTA HEAD: Anterior circulation: No occlusion or significant stenosis (50% or greater). Mild multifocal narrowing of the anterior and middle cerebral arteries. No aneurysm. Posterior circulation: No occlusion or significant stenosis (50% or greater). No aneurysm. Mild narrowing of the intradural left vertebral artery. Mild multifocal narrowing of the bilateral PCAs. Venous structures: Opacified intracranial venous structures appear patent.   No acute arterial abnormality in the head or neck. No CTA spot sign to suggest impending/active hemorrhage. Please refer to the contemporaneous noncontrast CT head and CT face for additional findings.  CT Facial Bones WO Contrast Result Date: 02/01/2024 CT FACE/SINUSES WITHOUT CONTRAST, 02/01/2024 9:29 AM INDICATION: fall? significant brusiing to head and neck but unclear trauma, on a blood thinner, patient is confused COMPARISON: None TECHNIQUE: High-resolution axial and/or coronal CT images of the maxillofacial region were obtained without contrast, using 2D reformations as needed. Portions of the brain, skull base, and upper neck were also included in the imaging field. All CT scans at [facility] and [facility] are performed using  radiation dose optimization techniques as appropriate to a performed exam, including but not limited to one or more of the following: automatic exposure control, adjustment of the mA and/or kV according to patient size, use of iterative reconstruction technique. In addition, our institution participates in a radiation dose monitoring program to optimize patient radiation exposure. FINDINGS: Superficial soft tissues: Partially imaged right posterior scalp contusion. Periorbital contusions. Orbits: Acute nondisplaced fracture of the right superior orbital rim, better seen on the contemporaneous CT head. Mild bilateral extraconal gas superior to the superior muscle complex with a right extraconal subperiosteal hematoma along the right superior orbital rim. Mild associated mass effect. No convincing retrobulbar hemorrhage. Lens replacements. Globes are bilaterally symmetric in size and contour. Sinuses: Mild mucosal thickening of the right maxillary sinus and  paranasal sinus opacification with a fluid level in the right maxillary sinus and right sphenoid sinuses with possible hyperattenuating contents. Slight asymmetric buckling of the posterior wall the right maxillary sinus without a discrete fracture lucency. Midface/nasal cavity: No acute fracture. Mandible: No acute fracture. Refer to the CT head for intracranial findings. Acute nondisplaced right skull base fracture further described on the CT head. Post traumatic gas dissecting into the right paravertebral space secondary to fracture of the right sphenoid bone.   1.  Acute nondisplaced fracture of the right superior orbital rim, better seen on the contemporaneous CT head. Small right subperiosteal extraconal hematoma just inferior to the right inferior orbital rim with with mild associated mass effect. 2.  Small amount of gas just inferior to the left superior orbital rim raise the possibility of an occult fracture. No pneumocephalus. 3.  Acute nondisplaced  right skull base fracture extending into the right occipital and parietal calvarium further described on the CT head, though which appears to extend into the right carotid canal. Attention to forthcoming CTA. 4.  Slight buckling of the posterior wall of the right maxillary sinus with air-fluid level and possible hemosinus without a discrete fracture plane.  CT Spine Cervical WO Contrast Result Date: 02/01/2024 CT CERVICAL SPINE WITHOUT CONTRAST, 02/01/2024 9:29 AM INDICATION: fall? significant brusiing to head and neck but unclear trauma, on a blood thinner, patient is confused COMPARISON: None TECHNIQUE: Thin-section axial CT images of the entire cervical spine were acquired without contrast. Supplemental 2D reformatted images were generated and reviewed as needed. All CT scans at [facility] and [facility] are performed using radiation dose optimization techniques as appropriate to a performed exam, including but not limited to one or more of the following: automatic exposure control, adjustment of the mA and/or kV according to patient size, use of iterative reconstruction technique. In addition, our institution participates in a radiation dose monitoring program to optimize patient radiation exposure. LEVELS IMAGED: Foramen magnum to upper thoracic region. FINDINGS: Alignment: No acute traumatic malalignment. Trace anterolisthesis of C6 on C7, C7 on T1, T1 and T2, likely degenerative. Craniocervical junction: No evidence of acute fracture or dislocation. Vertebrae: No acute fractures. Vertebral body heights maintained. Fusion of the right C2-C3 facets. Degenerative changes: No high-grade canal stenosis. Severe medial atlantodental changes. Multilevel facet arthropathy and uncovertebral joint spurring contribute to varying degrees of foraminal stenosis, moderate on the right at C4-C5 and on the left at C3-C4. Partially imaged right occipital and suboccipital scalp hematoma.    1.  No evidence of acute fracture or traumatic malalignment of the cervical spine. 2.  Please refer to the CT head and CT face for additional findings.  CT Head WO Contrast W Quant CT Tiss Character When Performed Result Date: 02/01/2024 CT HEAD WITHOUT CONTRAST, 02/01/2024 9:29 AM INDICATION: fall? significant brusiing to head and neck but unclear trauma, on a blood thinner, patient is confused COMPARISON: None TECHNIQUE: Axial CT images of the brain from skull base to vertex, including portions of the face and sinuses, were obtained without contrast. Supplemental 2D reformatted images were generated and reviewed as needed. All CT scans at [facility] and [facility] are performed using radiation dose optimization techniques as appropriate to a performed exam, including but not limited to one or more of the following: automatic exposure control, adjustment of the mA and/or kV according to patient size, use of iterative reconstruction technique. In addition, our institution participates  in a radiation dose monitoring program to optimize patient radiation exposure. FINDINGS: Calvarium/skull base: Acute nondisplaced fracture coursing inferiorly from the right mastoid temporal bone along the spine of the sphenoid bone superiorly along the occipitomastoid suture and into the right occipital and parietal bones. Right middle ear mastoid opacification. Gas dissecting along the temporomandibular joint and parapharyngeal space. Right posterior scalp hematoma. Recommend attention to CT face for additional findings. Lens replacements. Paranasal sinuses: Mild mucosal thickening of the right maxillary sinus with a fluid level. Attention to CT face. Brain: Acute multicompartmental hemorrhage with moderately sized left inferior frontal lobe and left inferior temporal lobe intraparenchymal contusions with local adjacent mass effect and edema. Smaller right inferior frontal lobe  intraparenchymal hemorrhagic contusions.  Acute moderate volume acute extra-axial bifrontal hemorrhage including a mixture of subarachnoid and subdural hematomas up to measuring 7 mm. Additional bilateral cerebral convexity subdural hematomas more posteriorly along the frontoparietal convexities measuring 6 mm on the right and 4 mm on the left as well as a thin subdural along the falx. Small acute intraventricular hemorrhage layering in the bilateral occipital horns. No hydrocephalus or acute large vascular territory infarct. Mild generalized parenchymal volume loss with ex vacuo dilation of the ventricular system. No overt hydrocephalus   1.  Acute multicompartmental hemorrhage as detailed above with moderately sized left inferior frontal and left inferior temporal lobe intraparenchymal contusions with local mass effect and moderate adjacent edema. Small volume intraventricular hemorrhage without overt hydrocephalus in the absence of prior imaging for comparison. No midline shift or herniation. 2.  Acute nondisplaced fracture coursing inferiorly from the right mastoid temporal bone along coursing superiorly along the occipitomastoid suture and into the right occipital and parietal bones. 3.  Attention to CT face for additional findings. Case discussed with Dr. Faustino by Dr. Rico via phone at 02/01/2024 10:17 AM.   ECG 12 lead Result Date: 02/01/2024 Ventricular Rate                   72        BPM                 QRS Duration                       90        ms                  Q-T Interval                       406       ms                  QTC Calculation Bazett             444       ms                  Calculated R Axis                  -19       degrees             Calculated T Axis                  21        degrees             Atrial fibrillation No previous ECGs available Confirmed by Rojelio Dunnings  [phone]  on 02-01-2024 9 29 11  AM      Prior Level of Function/Prior Device Use: Prior Level of  Function Self Care (bathing, dressing, using the toilet, eating): Independent - Patient completed the activities by him/herself, with or without an assistive device, with no assistance from a helper Indoor Mobility/Ambulation (with, or without a device such as a cane, crutch, or walker): Independent - Patient completed the activities by him/herself, with or without an assistive device, with no assistance from a helper Stairs (internal or external; with, or without a device such as a cane, crutch or walker): Independent - Patient completed the activities by him/herself, with or without an assistive device, with no assistance from a helper Functional Cognition (planning regular tasks, such as shopping or remembering to take medications): Independent - Patient completed the activities by him/herself, with or without an assistive device, with no assistance from a helper Prior devices used: none    Current Level of Function: Balance Sitting - Static: Close supervision Sitting - Dynamic: Contact Guard Assistance Standing - Static: Minimal Assistance Standing - Dynamic: Minimal Assistance Balance Additional Comments: Multiple losses of balance posterior with use of RW requiring assistance for management of base of support.   Bed Mobility Rolling: Moderate Assistance; Rolling - Left; Tactile Cues; Extra time; Verbal Cues Supine to Sit: Maximal Assistance; Verbal Cues; Tactile cues; Extra time Sit to Supine: Dependent; Verbal Cues; Tactile cues; Extra time Bed Mobility Additional Comments: Patient sitting in recliner upon PT entering.   Transfers Assistive Device: Walker-rolling Sit to Stand: Minimal Assistance Stand to Sit: Minimal Assistance Bed to/from Chair - Transfer Type: Stand Step Bed to/from Chair - Level of Assistance: Moderate Assistance; Extra time; Visual Cues; Tactile cues Transfers  Additional Comments: Increased tactile and verbal cues for proper hand positioning when transitioning  sit<>sit; RW present with education for nose over toes due to poor anteriorly weight shift with mulitple posterior balance losses upon transition into standing. Poor eccentric control noted descending with cueing for glut/quad activation to assist with control.   Mobility Gait Distance (ft): 80 ft Gait Additional Comments: Pre gait activities: lateral weights shifts, standing marches (poor foot clearance and single limb weight acceptance), and anterior/posterior weight shifts with RW support. Progression to gait with narrow base of support, decreased step length, and decreased management of RW requiring assistance at times. Multiple losses of balance with 90 and 180 degree turns requiring minimal assist to re-right to midline. Gait Assistance: Minimal Assistance (Chair follow) Assistive Device: Walker-rolling   ADLs Feeding Adaptive Equipment: Not observed. Anticipate at least Min due to cognitive deficits. Grooming Adaptive Equipment: Not observed. Anticipate at least Min due to cognitive deficits. Bathing: Moderate Assistance UB Dressing: Minimal Assistance; Extra time; Visual Cues; Verbal Cues UB Dressing Level: Standing LB Dressing: Contact Guard Assistance; Moderate Assistance LB Dressing Level: Sitting (socks. Would need Mod-Max As standing due to poor standing balance.) Toileting: Moderate Assistance (based on general assessment) Assistive Device: Walker-rolling    Speech/Hearing/Vision/Cognition:  Cognition:  Cognition Orientation Level: Not oriented Not Oriented to: Time, Situation, Place (Can sometimes provide correct name and DOB but not consistently.) Affect/Behavior: Impacted therapy session Affect/Behavior: Confused, Impulsive Overall Cognitive Status: Impaired Arousal/Alertness: Appropriate responses to stimuli Attention: Difficulty dividing attention Memory:  (Receptive and expressive deficits make memory assessment unreliable today) Following Commands: Inconsistent  following commands, Requires repetition, Requires increased time Safety Awareness: Impaired Insight: Decreased awareness of deficits Problem Solving: Assistance required to identify errors made, Assistance required to generate solutions, Assistance required to implement  solutions Executive Functioning: Impaired Executive Functioning Impairment:  (Aphasic-like receptive and expressive deficits) Comments: Pt pleasant and cooperative. Pt expressed being happy to be out of bed. Pt can follow instructions related to procedural memory such as take off your sock. Pt could anticipate next step when asked, What do you think I'll ask next? Pt reported, take off my other sock?  Pt perseverates on identification of objects and will repeat previous object shown when shown new common objects. Conversational word salad at times.    Vision/Perception and Hearing  Vision Detail: Unable to formally testdue to communincation deficits but vision oes seem functional for ADLs. Hearing: Increased volume of speech required    Communication: Aphasic like    ST evaluation 02/16/24: Assessment Summary for MBS Patient participated in an MBS and demonstrates mild oral and moderate pharyngeal dysphagia . No aspiration or penetration observed.  However, patient exhibited mild to moderate vallecular retention and moderate to severe retention in the piriform sinuses after the swallow which places patient at significant risk for aspiration.  Laryngeal elevation and epiglottic inversion were reduced.  Retrograde flow of the bolus through the PE segment and back into the piriform sinuses was observed on 1 occasion.  See full report below for details. Based on results of the swallow study, recommend Patient will benefit from skilled speech therapy services to address stated impairments with goal of optimizing safety and efficiency of swallowing least restrictive diet.  and Based on results of MBS, patient will benefit from a  follow up MBS.  As patient's amount of intake has been reduced over past days, he may benefit from supplements or possibly alternative nutrition for the short-term.     RECOMMENDATIONS MBS Recommendations:  Consider alternative Nutrition-Short term Solid Recommendations:  Pureed Liquid Recommendations:  Thin   Medication Recommendations: Crushed, With puree   Swallowing/Safe Feeding Strategies: Alternate solids and liquids, Allow extra time to swallow, 1:1 assist with meals, Eat/feed slowly, Fed only by trained staff/family, Feed only when alert, Remain upright 20-30 minutes following po intake, Small bites/sips, Upright as possible for all oral intake, Verbal cues     Recommendations ST Recommendations after Discharge: Dysphagia Therapy   ASSESSMENT AND PLAN Principal Problem:   Traumatic subdural hemorrhage without loss of consciousness, initial encounter (CMD)   Miran JAMIN PANTHER is a 83 y.o. male admitted on 02/24/2024 after acute care for subdural hemorrhage  IMPAIRED MOBILITY AND ADLs: Patient has been seen by PT/OT and is below normal functional level.  The patient would benefit from acute inpatient rehabilitation unit for PT/OT/RT/SLP Rehab.  Multidisciplinary inpatient rehabilitation has been recommended.   Patient is currently medically stable and is being admitted to inpatient rehabilitation for intensive therapies as well as 3 times per week physician supervision and management of medical co-morbidities and potential barriers to discharge. This patient is willing to participate in 3 hours of therapy a minimum of 5 days per week. Patient's level of medical complexity and substantial therapy needs support the medical necessity of an inpatient rehabilitation stay and cannot be provided at a lesser intensity of care such as a SNF. This admission is considered reasonable and necessary. This patient requires intensive inpatient therapy (i.e. 3 hours/day or 15 hours/week). I have  reviewed PT/OT/SLP Notes, and please refer to their corresponding notes for more details.  Recommend in inpatient setting with supervision by the physician due to multiple co-morbidities and potential barriers to discharge as outlined below:  Intracranial hemorrhage due to trauma presenting with vasogenic edema 2/2  hyponatremia PLAN - Continue to hold anticoagulation/antiplatelets and strongly recommend long-term discontinuation of Eliquis  given his falls and gait unsteadiness.   Keep Na >135.  - SBP goal <160 mmHg - No spine precautions - Follow-up with Dr. Scharlene 2-3 weeks with CT head scan obtained prior to follow-up.    A-fib    (CMD) HTN (hypertension) At home, takes metoprolol  succinate 50 mg daily.  Previously on Eliquis .  - Holding off on [facility] given IPH. Likely DC of eliquis  long term.  - Continue metoprolol  to tartrate 25 mg twice daily.  - Per neurosurgery, goal SBP < 160. PRN labetalol for SBP > 160.   - Keep K > 4 and Mg > 2 - Blood pressure soft.  Discontinue amlodipine.  Continue metoprolol .  Monitor blood pressure closely.  Type 2 diabetes mellitus (CMD) Last a1c results: 7.0 on 02/09/2024 - FSBS ACHS, SSI - Consistent carb diet - Hypoglycemia protocol  - Home Jardiance-metformin held  HLD (hyperlipidemia) - Continue home statin  BPH (benign prostatic hyperplasia) Foley placed in the ICU. Foley dc'd on 1/16. Patient has had to be in/out cath'd twice so far (retaining >600cc). Later, foley had to be placed on 1/17. Plan -Flomax - Foley removed on 02/19/24 -Voiding successfully.  Constipation -Possibly 2/2 medications and increased PO intake -Abdomen soft, non-tender -Miralax    Dysphagia SLP evaluated him on 1/19 and recommended soft and bite sized food with thin liquids except when talking to daughter they preferred trying pureed food for now given their concern that patient pockets food in mouth. Pureed diet ordered.  -Aspiration precautions   # DIET:  pureed with thin liquids # Precautions: aspiration # DVT Prophylaxis: SCD's  # CODE STATUS:Full Code    Bowel management: Rehab bowel program   Bladder Management  assess PVR Pulmonary hygiene   Encourage incentive spirometer use. Skin assessments  Continue to perform/document skin assessment every shift.  Notify MD of any new or suspected pressure injuries throughout stay.   Rehabilitation goals:  Independent/modified independent with ADLs and mobility, equipment evaluation, discharge planning,caregiver/family education Estimated date of Discharge: 14-21 Days from admission to inpatient rehab Rehabilitation Potential:  Fair to good Discharge Destination:  Home Any falls since admission? no Drug regimen reviewed: No issues found.  Follow up Appointments/Discharge instructions: Ryan JONETTA Hives, MD >        Time spent on counseling/coordination of care: 30 Minutes  Total time spent on patient: 1 Hour This record has been created using Dragon voice recognition software.   Lynwood Gladis Shuck, MD        [1] Past Medical History: Diagnosis Date   Cancer    (CMD)    skin cancer   Diabetes mellitus type II, controlled (CMD)    Hyperlipidemia    Hypertension   [2] Past Surgical History: Procedure Laterality Date   OTHER SURGICAL HISTORY     Procedure: OTHER SURGICAL HISTORY (Dental Implants); x4   SKIN BIOPSY     Procedure: SKIN BIOPSY   SOFT TISSUE CYST EXCISION Left    Procedure: SOFT TISSUE CYST EXCISION; Shoulder- fatty tissue   TONSILLECTOMY     Procedure: TONSILLECTOMY   TOTAL HIP ARTHROPLASTY Right 01/16/2020   Procedure: TOTAL HIP REPLACEMENT - POSTERIOR APPROACH;  Surgeon: Norleen Dyana Hoar, MD;  Location: [facility] MAIN OR;  Service: Orthopedics;  Laterality: Right;  [3] Family History Problem Relation Name Age of Onset   Heart attack Father    [4] Allergies Allergen Reactions   Adhesive Rash  Bandaids, coban is preferred.

## 2024-02-24 NOTE — Progress Notes (Signed)
" °   02/24/24 0959  Reason Eval/Treat Not Completed  Reason Eval/Treat Not Completed Provider request hold;Not medically appropriate   Patient with change in mentation. Per MD, patient is going for a Stat CT head and stat labs. Will hold at this time until medially cleared. Will continue efforts at a later date.  "
